# Patient Record
Sex: Female | Born: 1960 | Race: White | Hispanic: No | Marital: Married | Smoking: Former smoker
Health system: Southern US, Community
[De-identification: ages and names within clinical notes are randomized; demographics above are authoritative.]

## PROBLEM LIST (undated history)

## (undated) DIAGNOSIS — R112 Nausea with vomiting, unspecified: Secondary | ICD-10-CM

## (undated) DIAGNOSIS — Z9889 Other specified postprocedural states: Secondary | ICD-10-CM

## (undated) DIAGNOSIS — G4730 Sleep apnea, unspecified: Secondary | ICD-10-CM

## (undated) DIAGNOSIS — M5030 Other cervical disc degeneration, unspecified cervical region: Secondary | ICD-10-CM

## (undated) DIAGNOSIS — F419 Anxiety disorder, unspecified: Secondary | ICD-10-CM

## (undated) DIAGNOSIS — I10 Essential (primary) hypertension: Secondary | ICD-10-CM

## (undated) HISTORY — PX: BACK SURGERY: SHX140

## (undated) HISTORY — DX: Sleep apnea, unspecified: G47.30

## (undated) HISTORY — DX: Other specified postprocedural states: Z98.890

---

## 1998-04-12 ENCOUNTER — Ambulatory Visit: Admission: RE | Admit: 1998-04-12 | Discharge: 1998-04-12 | Payer: Self-pay | Admitting: Endocrinology

## 1998-10-28 ENCOUNTER — Other Ambulatory Visit: Admission: RE | Admit: 1998-10-28 | Discharge: 1998-10-28 | Payer: Self-pay | Admitting: Obstetrics & Gynecology

## 1999-01-02 ENCOUNTER — Encounter: Payer: Self-pay | Admitting: Obstetrics & Gynecology

## 1999-01-02 ENCOUNTER — Ambulatory Visit (HOSPITAL_COMMUNITY): Admission: RE | Admit: 1999-01-02 | Discharge: 1999-01-02 | Payer: Self-pay | Admitting: Obstetrics & Gynecology

## 1999-03-09 ENCOUNTER — Inpatient Hospital Stay (HOSPITAL_COMMUNITY): Admission: AD | Admit: 1999-03-09 | Discharge: 1999-03-09 | Payer: Self-pay | Admitting: Obstetrics and Gynecology

## 1999-03-11 ENCOUNTER — Inpatient Hospital Stay (HOSPITAL_COMMUNITY): Admission: AD | Admit: 1999-03-11 | Discharge: 1999-03-11 | Payer: Self-pay | Admitting: Obstetrics & Gynecology

## 1999-10-05 ENCOUNTER — Other Ambulatory Visit: Admission: RE | Admit: 1999-10-05 | Discharge: 1999-10-05 | Payer: Self-pay | Admitting: Obstetrics and Gynecology

## 2000-10-17 ENCOUNTER — Other Ambulatory Visit: Admission: RE | Admit: 2000-10-17 | Discharge: 2000-10-17 | Payer: Self-pay | Admitting: Obstetrics and Gynecology

## 2000-11-22 ENCOUNTER — Ambulatory Visit (HOSPITAL_COMMUNITY): Admission: RE | Admit: 2000-11-22 | Discharge: 2000-11-22 | Payer: Self-pay | Admitting: *Deleted

## 2000-11-22 ENCOUNTER — Encounter: Payer: Self-pay | Admitting: *Deleted

## 2000-12-29 ENCOUNTER — Encounter: Payer: Self-pay | Admitting: Obstetrics and Gynecology

## 2000-12-29 ENCOUNTER — Ambulatory Visit (HOSPITAL_COMMUNITY): Admission: RE | Admit: 2000-12-29 | Discharge: 2000-12-29 | Payer: Self-pay | Admitting: Obstetrics and Gynecology

## 2001-03-27 ENCOUNTER — Encounter (HOSPITAL_COMMUNITY): Admission: AD | Admit: 2001-03-27 | Discharge: 2001-04-14 | Payer: Self-pay | Admitting: *Deleted

## 2001-04-11 ENCOUNTER — Inpatient Hospital Stay (HOSPITAL_COMMUNITY): Admission: AD | Admit: 2001-04-11 | Discharge: 2001-04-17 | Payer: Self-pay | Admitting: Obstetrics and Gynecology

## 2001-04-11 ENCOUNTER — Encounter (INDEPENDENT_AMBULATORY_CARE_PROVIDER_SITE_OTHER): Payer: Self-pay

## 2001-04-12 ENCOUNTER — Encounter: Payer: Self-pay | Admitting: Obstetrics and Gynecology

## 2001-04-18 ENCOUNTER — Encounter: Admission: RE | Admit: 2001-04-18 | Discharge: 2001-04-19 | Payer: Self-pay | Admitting: Obstetrics and Gynecology

## 2001-10-19 ENCOUNTER — Other Ambulatory Visit: Admission: RE | Admit: 2001-10-19 | Discharge: 2001-10-19 | Payer: Self-pay | Admitting: Obstetrics and Gynecology

## 2002-10-30 ENCOUNTER — Other Ambulatory Visit: Admission: RE | Admit: 2002-10-30 | Discharge: 2002-10-30 | Payer: Self-pay | Admitting: Obstetrics and Gynecology

## 2004-03-31 ENCOUNTER — Ambulatory Visit (HOSPITAL_COMMUNITY): Admission: RE | Admit: 2004-03-31 | Discharge: 2004-03-31 | Payer: Self-pay | Admitting: Obstetrics and Gynecology

## 2004-11-09 ENCOUNTER — Other Ambulatory Visit: Admission: RE | Admit: 2004-11-09 | Discharge: 2004-11-09 | Payer: Self-pay | Admitting: Obstetrics and Gynecology

## 2005-10-27 ENCOUNTER — Ambulatory Visit (HOSPITAL_COMMUNITY): Admission: RE | Admit: 2005-10-27 | Discharge: 2005-10-27 | Payer: Self-pay | Admitting: Obstetrics and Gynecology

## 2006-11-11 ENCOUNTER — Ambulatory Visit (HOSPITAL_COMMUNITY): Admission: RE | Admit: 2006-11-11 | Discharge: 2006-11-11 | Payer: Self-pay | Admitting: Obstetrics and Gynecology

## 2007-11-28 ENCOUNTER — Ambulatory Visit (HOSPITAL_COMMUNITY): Admission: RE | Admit: 2007-11-28 | Discharge: 2007-11-28 | Payer: Self-pay | Admitting: Obstetrics and Gynecology

## 2010-09-01 ENCOUNTER — Ambulatory Visit: Payer: Self-pay | Admitting: Family Medicine

## 2010-09-01 DIAGNOSIS — E785 Hyperlipidemia, unspecified: Secondary | ICD-10-CM | POA: Insufficient documentation

## 2010-09-01 DIAGNOSIS — I10 Essential (primary) hypertension: Secondary | ICD-10-CM

## 2010-10-07 ENCOUNTER — Telehealth: Payer: Self-pay | Admitting: Family Medicine

## 2010-10-22 NOTE — Progress Notes (Signed)
----   Converted from flag ---- ---- 10/05/2010 9:08 AM, Nani Gasser MD wrote: Just went over her old records. Is she still on CPAP for sleep apnea? Still setting of 7? ------------------------------  10/07/10 10:08 acm called and left message for pt to call me back and let me knowPhone Note Call from Patient   Caller: Patient Summary of Call: pt called back and states she is still using CPAP machine with a setting of 7 Initial call taken by: Avon Gully CMA, Duncan Dull),  October 09, 2010 10:49 AM       Allergies: No Known Drug Allergies

## 2010-10-22 NOTE — Assessment & Plan Note (Signed)
Summary: NOV: URI   Vital Signs:  Patient profile:   50 year old female Height:      61 inches Weight:      183 pounds Pulse rate:   80 / minute BP sitting:   128 / 75  (right arm) Cuff size:   regular  Vitals Entered By: Avon Gully CMA, Duncan Dull) (September 01, 2010 3:01 PM) CC: NP-persistant cough that keeps her awake at night   CC:  NP-persistant cough that keeps her awake at night.  History of Present Illness: NP-persistant cough that keeps her awake at night. Started about 2 weeks ago. Thought initially was having bronchitis. Having alot of drainage but no nasal congestion. Occ will feel SOB with cough.  No fever.  No ear pain, Mild ST from the cough.  Some pressure around the left ear.  No sinus pressure or pain.  + sick contacts.  Taking delsym and cough drops.    Habits & Providers  Alcohol-Tobacco-Diet     Alcohol drinks/day: <1     Tobacco Status: quit     Year Quit: 1998  Exercise-Depression-Behavior     Does Patient Exercise: no     STD Risk: never     Drug Use: no     Seat Belt Use: always  Current Medications (verified): 1)  Lovaza 1 Gm Caps (Omega-3-Acid Ethyl Esters) .... Take One Tablet By Mouth Twice Daily 2)  Trilipix 135 Mg Cpdr (Choline Fenofibrate) .... Take One By Mouth Once Daily 3)  Diovan Hct 320-25 Mg Tabs (Valsartan-Hydrochlorothiazide) .... Take One By Mouth Once Daily 4)  Multivitamins  Caps (Multiple Vitamin) .... Take One Tablet By Mouth Once Daily 5)  Bupropion Hcl 300 Mg Xr24h-Tab (Bupropion Hcl) .... Take One Tablet By Mouth Once Daily 6)  Effexor Xr 37.5 Mg Xr24h-Cap (Venlafaxine Hcl) .... Take One By Mouth Once Daily  Allergies (verified): No Known Drug Allergies  Comments:  Nurse/Medical Assistant: The patient's medications and allergies were reviewed with the patient and were updated in the Medication and Allergy Lists. Avon Gully CMA, Duncan Dull) (September 01, 2010 3:06 PM)  Past History:  Past Surgical History: c/sec  2002  Family History: Mother with hi chol Father with hi cho, suicide MGM with DM Brother with high cho  Social History: Veterinary surgeon for [employer].  College degree. Married to [employer] with 1 child Former Smoker Alcohol use-yes Drug use-no Regular exercise-no 4-5 caffeinated drinks per day. Smoking Status:  quit Does Patient Exercise:  no STD Risk:  never Drug Use:  no Seat Belt Use:  always  Review of Systems       No fever/sweats/weakness, unexplained weight loss/gain.  No vison changes.  No difficulty hearing/ringing in ears, hay fever/allergies.  No chest pain/discomfort, palpitations.  No Br lump/nipple discharge.  + cough/wheeze.  No blood in BM, nausea/vomiting/diarrhea.  No nighttime urination, leaking urine, unusual vaginal bleeding, discharge (penis or vagina).  No muscle/joint pain. No rash, change in mole.  No HA, memory loss.  No anxiety, sleep d/o, depression.  No easy bruising/bleeding, unexplained lump   Physical Exam  General:  Well-developed,well-nourished,in no acute distress; alert,appropriate and cooperative throughout examination Head:  Normocephalic and atraumatic without obvious abnormalities. No apparent alopecia or balding. Eyes:  No corneal or conjunctival inflammation noted. EOMI. Perrla.  Ears:  External ear exam shows no significant lesions or deformities.  Otoscopic examination reveals clear canals, tympanic membranes are intact bilaterally without bulging, retraction, inflammation or discharge. Hearing is grossly normal bilaterally.  Nose:  External nasal examination shows no deformity or inflammation. Nasal mucosa are pink and moist without lesions or exudates. Mouth:  Oral mucosa and oropharynx without lesions or exudates.   Neck:  No deformities, masses, or tenderness noted. Lungs:  Normal respiratory effort, chest expands symmetrically. Lungs are clear to auscultation, no crackles or wheezes. Heart:  Normal rate and regular rhythm.  S1 and S2 normal without gallop, murmur, click, rub or other extra sounds. Skin:  no rashes.   Cervical Nodes:  No lymphadenopathy noted Psych:  Cognition and judgment appear intact. Alert and cooperative with normal attention span and concentration. No apparent delusions, illusions, hallucinations   Impression & Recommendations:  Problem # 1:  URI (ICD-465.9) I really do think this is viral. Treat nightime cough. Gave reassurance. Call if not better in one weekk, or new signs, or worsen.  Her updated medication list for this problem includes:    Hydrocodone-homatropine 5-1.5 Mg/51ml Syrp (Hydrocodone-homatropine) .Marland KitchenMarland KitchenMarland KitchenMarland Kitchen 5ml by mouth at bedtime as needed cough  Complete Medication List: 1)  Lovaza 1 Gm Caps (Omega-3-acid ethyl esters) .... Take one tablet by mouth twice daily 2)  Trilipix 135 Mg Cpdr (Choline fenofibrate) .... Take one by mouth once daily 3)  Diovan Hct 320-25 Mg Tabs (Valsartan-hydrochlorothiazide) .... Take one by mouth once daily 4)  Multivitamins Caps (Multiple vitamin) .... Take one tablet by mouth once daily 5)  Bupropion Hcl 300 Mg Xr24h-tab (Bupropion hcl) .... Take one tablet by mouth once daily 6)  Effexor Xr 37.5 Mg Xr24h-cap (Venlafaxine hcl) .... Take one by mouth once daily 7)  Hydrocodone-homatropine 5-1.5 Mg/68ml Syrp (Hydrocodone-homatropine) .... 5ml by mouth at bedtime as needed cough  Other Orders: T-Comprehensive Metabolic Panel [phone]) T-Lipid Profile ([id number]) T-TSH [phone])  Patient Instructions: 1)  Follow up in january for routine follow up on medications.  Prescriptions: HYDROCODONE-HOMATROPINE 5-1.5 MG/5ML SYRP (HYDROCODONE-HOMATROPINE) 5ml by mouth at bedtime as needed cough  #157ml x 0   Entered and Authorized by:   Nani Gasser MD   Signed by:   Nani Gasser MD on 09/01/2010   Method used:   Printed then faxed to ...       [facility]* (retail)       [address] [place], [place]  [address]        Ph: [phone]       Fax: [phone]   RxID:   [id number]    Orders Added: 1)  T-Comprehensive Metabolic Panel [[id number]] 2)  T-Lipid Profile [[id number]] 3)  T-TSH [[id number]] 4)  Est. Patient Level III [18841] 5)  New Patient Level III [66063]  Appended Document: NOV: URI    Past History:  Past Medical History: Sleep Apnea CPAP 7 cm water

## 2010-10-22 NOTE — Letter (Signed)
Summary: Records Dated 01-27-05 thru 10-13-09/[facility]  Records Dated 01-27-05 thru 10-13-09/[facility]   Imported By: Lanelle Bal 10/15/2010 10:06:17  _____________________________________________________________________  External Attachment:    Type:   Image     Comment:   External Document

## 2010-11-02 ENCOUNTER — Telehealth: Payer: Self-pay | Admitting: Family Medicine

## 2010-11-05 ENCOUNTER — Encounter: Payer: Self-pay | Admitting: Family Medicine

## 2010-11-05 LAB — CONVERTED CEMR LAB
ALT: 25 units/L (ref 0–35)
AST: 21 units/L (ref 0–37)
Albumin: 4.6 g/dL (ref 3.5–5.2)
Calcium: 9.4 mg/dL (ref 8.4–10.5)
Chloride: 100 meq/L (ref 96–112)
LDL Cholesterol: 105 mg/dL — ABNORMAL HIGH (ref 0–99)
Potassium: 4 meq/L (ref 3.5–5.3)
Sodium: 138 meq/L (ref 135–145)
TSH: 3.851 microintl units/mL (ref 0.350–4.500)
Total Protein: 6.9 g/dL (ref 6.0–8.3)

## 2010-11-11 NOTE — Progress Notes (Signed)
  Phone Note Refill Request Message from:  Fax from Pharmacy on November 02, 2010 9:30 AM  Refills Requested: Medication #1:  BUPROPION HCL 300 MG XR24H-TAB take one tablet by mouth once daily Initial call taken by: Avon Gully CMA, Duncan Dull),  November 02, 2010 9:30 AM    Prescriptions: BUPROPION HCL 300 MG XR24H-TAB (BUPROPION HCL) take one tablet by mouth once daily  #90 x 1   Entered by:   Avon Gully CMA, (AAMA)   Authorized by:   Nani Gasser MD   Signed by:   Avon Gully CMA, (AAMA) on 11/02/2010   Method used:   Printed then faxed to ...       [facility]* (mail-order)       [address] [place], [place]  [address]       Ph: [phone]       Fax: [phone]   RxID:   [id number]

## 2010-11-16 ENCOUNTER — Ambulatory Visit (INDEPENDENT_AMBULATORY_CARE_PROVIDER_SITE_OTHER): Payer: BC Managed Care – PPO | Admitting: Family Medicine

## 2010-11-16 ENCOUNTER — Encounter: Payer: Self-pay | Admitting: Family Medicine

## 2010-11-16 DIAGNOSIS — F39 Unspecified mood [affective] disorder: Secondary | ICD-10-CM

## 2010-11-16 DIAGNOSIS — E785 Hyperlipidemia, unspecified: Secondary | ICD-10-CM

## 2010-11-16 DIAGNOSIS — E1165 Type 2 diabetes mellitus with hyperglycemia: Secondary | ICD-10-CM

## 2010-11-16 DIAGNOSIS — E1159 Type 2 diabetes mellitus with other circulatory complications: Secondary | ICD-10-CM | POA: Insufficient documentation

## 2010-11-16 DIAGNOSIS — E119 Type 2 diabetes mellitus without complications: Secondary | ICD-10-CM | POA: Insufficient documentation

## 2010-11-16 DIAGNOSIS — R7309 Other abnormal glucose: Secondary | ICD-10-CM

## 2010-11-24 ENCOUNTER — Encounter: Payer: Self-pay | Admitting: *Deleted

## 2010-11-26 NOTE — Assessment & Plan Note (Signed)
Summary: fu bloodwork   Vital Signs:  Patient profile:   50 year old female Height:      61 inches Weight:      187 pounds Pulse rate:   94 / minute BP sitting:   116 / 68  (right arm) Cuff size:   regular  Vitals Entered By: Avon Gully CMA, Duncan Dull) (November 16, 2010 8:35 AM) CC: f/u cholesterol, Hypertension Management   CC:  f/u cholesterol and Hypertension Management.  History of Present Illness: Here to discuss labs results. Also wanting to only be on one mood med for her irritabilityh and menopausal sxs.    Hypertension History:      She denies headache, chest pain, palpitations, dyspnea with exertion, orthopnea, PND, peripheral edema, visual symptoms, neurologic problems, syncope, and side effects from treatment.  She notes no problems with any antihypertensive medication side effects.        Positive major cardiovascular risk factors include hyperlipidemia and hypertension.  Negative major cardiovascular risk factors include female age less than 22 years old and non-tobacco-user status.     Current Medications (verified): 1)  Lovaza 1 Gm Caps (Omega-3-Acid Ethyl Esters) .... Take One Tablet By Mouth Twice Daily 2)  Trilipix 135 Mg Cpdr (Choline Fenofibrate) .... Take One By Mouth Once Daily 3)  Diovan Hct 320-25 Mg Tabs (Valsartan-Hydrochlorothiazide) .... Take One By Mouth Once Daily 4)  Multivitamins  Caps (Multiple Vitamin) .... Take One Tablet By Mouth Once Daily 5)  Bupropion Hcl 300 Mg Xr24h-Tab (Bupropion Hcl) .... Take One Tablet By Mouth Once Daily 6)  Effexor Xr 37.5 Mg Xr24h-Cap (Venlafaxine Hcl) .... Take One By Mouth Once Daily 7)  Hydrocodone-Homatropine 5-1.5 Mg/32ml Syrp (Hydrocodone-Homatropine) .... 5ml By Mouth At Bedtime As Needed Cough  Allergies (verified): No Known Drug Allergies  Comments:  Nurse/Medical Assistant: The patient's medications and allergies were reviewed with the patient and were updated in the Medication and Allergy  Lists. Avon Gully CMA, Duncan Dull) (November 16, 2010 8:35 AM)  Physical Exam  General:  Well-developed,well-nourished,in no acute distress; alert,appropriate and cooperative throughout examination Lungs:  Normal respiratory effort, chest expands symmetrically. Lungs are clear to auscultation, no crackles or wheezes. Heart:  Normal rate and regular rhythm. S1 and S2 normal without gallop, murmur, click, rub or other extra sounds. Pulses:  RAdial 2+    Impression & Recommendations:  Problem # 1:  HYPERLIPIDEMIA (ICD-272.4) Encorage regular exercise to improer her LDL and imcrease her HDL.   Her updated medication list for this problem includes:    Lovaza 1 Gm Caps (Omega-3-acid ethyl esters) .Marland Kitchen... Take one tablet by mouth twice daily    Trilipix 135 Mg Cpdr (Choline fenofibrate) .Marland Kitchen... 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  F/U and adjust effexor as needed. She is mainly taking this for mood and menopausal sxs.    Problem # 3:  OTHER ABNORMAL GLUCOSE (ICD-790.29) Dsicussed dec sweets and carb intake. Recheck in 6 weeks with fasting glucose.   Complete Medication List: 1)  Lovaza 1 Gm Caps (Omega-3-acid ethyl esters) .... Take one tablet by mouth twice daily 2)  Trilipix 135 Mg Cpdr (Choline fenofibrate) .... Take one by mouth once daily 3)  Diovan Hct 320-25 Mg Tabs (Valsartan-hydrochlorothiazide) .... Take one by mouth once daily 4)  Multivitamins Caps (Multiple vitamin) .... Take one tablet by mouth once daily 5)  Bupropion Hcl 150 Mg Xr12h-tab (Bupropion hcl) .... Take 1  tablet by mouth once a day 6)  Effexor Xr 37.5 Mg Xr24h-cap (Venlafaxine hcl) .... Take one by mouth once daily  Hypertension Assessment/Plan:      The patient's hypertensive risk group is category B: At least one risk factor (excluding diabetes) with no target organ damage.  Her calculated 10 year risk of coronary heart disease is 3 %.   Today's blood pressure is 116/68.  Her blood pressure goal is < 140/90.  Patient Instructions: 1)  Decrease to 150mg  Buproprion for one month then take every other day for 2 weeksa nd then stop.  2)  Please schedule a follow-up appointment in 4-6 weeks for mood and fingerstick blood sugar (make sure fasting).   3)  Work on getting regular exercise and decreasing your carbs.  Prescriptions: BUPROPION HCL 150 MG XR12H-TAB (BUPROPION HCL) Take 1 tablet by mouth once a day  #30 x 0   Entered and Authorized by:   Nani Gasser MD   Signed by:   Nani Gasser MD on 11/16/2010   Method used:   Electronically to        [facility]* (retail)       [address] [place], [place]  [address]       Ph: [phone]       Fax: [phone]   RxID:   [id number]    Orders Added: 1)  Est. Patient Level III [84132]

## 2010-12-11 ENCOUNTER — Telehealth: Payer: Self-pay | Admitting: *Deleted

## 2010-12-11 NOTE — Telephone Encounter (Signed)
Pt states she has questions about coming off of antidepressant. Please call and discuss w/ Pt.

## 2010-12-14 ENCOUNTER — Other Ambulatory Visit: Payer: Self-pay | Admitting: Family Medicine

## 2010-12-16 NOTE — Telephone Encounter (Signed)
Called and left message on pt vm that per last note pt was to f/u in 4-6 weeks so advised to schedule appt to discuss concerns with Dr.  About mood

## 2010-12-20 ENCOUNTER — Encounter: Payer: Self-pay | Admitting: Family Medicine

## 2010-12-21 ENCOUNTER — Ambulatory Visit (INDEPENDENT_AMBULATORY_CARE_PROVIDER_SITE_OTHER): Payer: BC Managed Care – PPO | Admitting: Family Medicine

## 2010-12-21 ENCOUNTER — Encounter: Payer: Self-pay | Admitting: Family Medicine

## 2010-12-21 VITALS — BP 107/73 | HR 84 | Ht 61.0 in | Wt 188.0 lb

## 2010-12-21 DIAGNOSIS — R7309 Other abnormal glucose: Secondary | ICD-10-CM

## 2010-12-21 DIAGNOSIS — F39 Unspecified mood [affective] disorder: Secondary | ICD-10-CM

## 2010-12-21 DIAGNOSIS — I10 Essential (primary) hypertension: Secondary | ICD-10-CM

## 2010-12-21 LAB — POCT CBG (FASTING - GLUCOSE)-MANUAL ENTRY: Glucose Fasting, POC: 104 mg/dL

## 2010-12-21 MED ORDER — VALSARTAN-HYDROCHLOROTHIAZIDE 160-25 MG PO TABS: 1.0000 | ORAL_TABLET | Freq: Every day | ORAL | Status: DC

## 2010-12-21 NOTE — Assessment & Plan Note (Addendum)
Doing well. BP is actually a little low and you feel you are having sxs so will dec her diovan dose. Recheck in 1 month to make sure not running too high.

## 2010-12-21 NOTE — Assessment & Plan Note (Signed)
Stay on wellbutrin 150 until see ob about the Efffexor  At her f/u visit with OB.  After 2 weeks on the increase dose of effexor then can start to wean the welbutrin.

## 2010-12-21 NOTE — Progress Notes (Signed)
  Subjective:    Patient ID: Loretta Bullock, female    DOB: [birth year], 50 y.o.   MRN: [id number]  Hypertension This is a chronic problem. The problem has been gradually improving since onset. The problem is controlled. Associated symptoms include malaise/fatigue. Pertinent negatives include no chest pain, palpitations, peripheral edema or shortness of breath. (Dizziness. ) There are no associated agents to hypertension. Past treatments include diuretics and angiotensin blockers.   Likes the fluid pill part to control her ankle edema.   Tried weaning off the wellbutrin. Has been more irritable on the lower dose.  She is also on effexor and sees her GYN later in April.   Review of Systems  Constitutional: Positive for malaise/fatigue.  Respiratory: Negative for shortness of breath.   Cardiovascular: Negative for chest pain and palpitations.       Objective:   Physical Exam  Constitutional: She appears well-developed and well-nourished.  Neck: Normal range of motion. Normal carotid pulses present.  Cardiovascular: Normal rate, regular rhythm and normal heart sounds.   Pulmonary/Chest: Effort normal and breath sounds normal.          Assessment & Plan:

## 2010-12-21 NOTE — Assessment & Plan Note (Addendum)
Glucose was 104 today. Will change dx to insulin resistance.  F/u in 6 months. Continue to work on low carb and sugar intake.  Regular exercise.

## 2010-12-22 ENCOUNTER — Other Ambulatory Visit: Payer: Self-pay | Admitting: *Deleted

## 2010-12-22 MED ORDER — BUPROPION HCL ER (SR) 150 MG PO TB12: 150.0000 mg | ORAL_TABLET | ORAL | Status: DC

## 2010-12-22 NOTE — Telephone Encounter (Signed)
Pt called and states she received 15 pills for wellbutrin and was seen yesterday and needs the other 15 pills sent to [facility]

## 2010-12-28 ENCOUNTER — Ambulatory Visit: Payer: BC Managed Care – PPO | Admitting: Family Medicine

## 2011-01-20 ENCOUNTER — Encounter: Payer: Self-pay | Admitting: Family Medicine

## 2011-01-20 ENCOUNTER — Ambulatory Visit: Payer: BC Managed Care – PPO | Admitting: Family Medicine

## 2011-01-20 ENCOUNTER — Ambulatory Visit (INDEPENDENT_AMBULATORY_CARE_PROVIDER_SITE_OTHER): Payer: BC Managed Care – PPO | Admitting: Family Medicine

## 2011-01-20 DIAGNOSIS — F39 Unspecified mood [affective] disorder: Secondary | ICD-10-CM

## 2011-01-20 DIAGNOSIS — G4730 Sleep apnea, unspecified: Secondary | ICD-10-CM

## 2011-01-20 DIAGNOSIS — I10 Essential (primary) hypertension: Secondary | ICD-10-CM

## 2011-01-20 NOTE — Assessment & Plan Note (Signed)
Overall she's doing well but has noticed a little increase in irritability. We may need to adjust her Effexor but we will continue the current dose for now and followup in July. she can certainly call in the interim if she feels that her dose needs to be adjusted.

## 2011-01-20 NOTE — Progress Notes (Signed)
  Subjective:    Patient ID: Loretta Bullock, female    DOB: 1961-08-29, 50 y.o.   MRN: [id number]  HPI BP has been normal at home. Feeling a little better on the lower dose of Diovan. She has been trying to work on her diet and weight loss.  Doing well on the effexor 75. We increased her dose at the visit.  Almost off the wellbutrin. She has one tab left she has noticed a little bit of increased irritability she is also going through some stressful things at home right now.  She is trying to get just on one med off her list.     Review of Systems     Objective:   Physical Exam  Constitutional: She appears well-developed and well-nourished.  Cardiovascular: Normal rate, regular rhythm and normal heart sounds.   Pulmonary/Chest: Effort normal and breath sounds normal.          Assessment & Plan:

## 2011-01-20 NOTE — Assessment & Plan Note (Signed)
Her blood pressure looks fantastic today. Home blood pressures have been well controlled 2. That's recheck in about 2-3 months and hopefully we can even more reduce her medication.

## 2011-02-05 NOTE — Op Note (Signed)
[facility]  Patient:    Loretta Bullock, Loretta Bullock                       MRN: [id number] Proc. Date: 04/13/01 Adm. Date:  [id number] Attending:  Malon Kindle                           Operative Report  PREOPERATIVE DIAGNOSES:       1. Intrauterine pregnancy at 36 weeks.                               2. Advanced maternal age.                               3. Gestational hypertension.                               4. Arrest of descent.  POSTOPERATIVE DIAGNOSES:      1. Intrauterine pregnancy at 36 weeks.                               2. Advanced maternal age.                               3. Gestational hypertension.                               4. Arrest of descent.  PROCEDURE:                    Primary low transverse cesarean section without extension.  SURGEON:                      Zenaida Niece, M.D.  ANESTHESIA:                   Epidural.  ESTIMATED BLOOD LOSS:         800 cc.  FINDINGS:                     Normal anatomy.  Delivery of a viable female infant with Apgars of 9 and 9 and weight 5 lb 11 oz.  PROCEDURE IN DETAIL:          The patient was taken to the operating room and placed in the dorsal supine position with a left lateral tilt.  Her previously-placed epidural was dosed appropriately.  Her abdomen was prepped and draped in the usual sterile fashion.  The level of her anesthesia was found to be adequate.  Her abdomen was entered via a standard Pfannenstiel incision.  The vesicouterine peritoneum was incised and a bladder flap created digitally.  A 4 cm transverse incision was made in the lower uterine segment and clear amniotic fluid noted.  The uterine incision was extended bilaterally digitally.  The fetal vertex was grasped and moved out of the pelvis and was found to be in an OP position.  It was rotated and delivered through the incision atraumatically.  The mouth and nares were suctioned and the remainder of the  infant delivered atraumatically.  The cord  was doubly clamped and cut and the infant handed to the awaiting pediatric team.  Cord blood was obtained and the placenta was manually delivered.  The uterus was wiped dry with a clean lap pad.  The uterine incision was inspected and found to be free of extensions.  The uterine incision was closed in one layer, being a running locking layer with #1 chromic.  An additional imbricating suture was used on the right angle to achieve adequate hemostasis.  Bleeding from the serosal edges was controlled with electrocautery.  The tubes and ovaries were inspected and found to be normal.  The subfascial space was then irrigated and made hemostatic with electrocautery.  The fascia was closed in a running fashion starting at both ends and meeting in the middle with 0 Vicryl.  The subcutaneous tissue was irrigated and made hemostatic with electrocautery. The skin was then closed with staples and a sterile dressing.  The patient tolerated the procedure well and was taken to the recovery room in stable condition.  She received Ancef 1 g after cord clamp.  All counts were correct. D:  04/13/01 TD:  04/14/01 Job: [id number]

## 2011-02-05 NOTE — Discharge Summary (Signed)
[facility]  Patient:    Loretta Bullock, Loretta Bullock                       MRN: [id number] Adm. Date:  [id number] Disc. Date: [id number] Attending:  Malon Kindle                           Discharge Summary  ADMISSION DIAGNOSES:          Intrauterine pregnancy at 36 weeks, gestational hypertension.  DISCHARGE DIAGNOSES:          Intrauterine pregnancy at 36 weeks, gestational hypertension, arrest of descent.  PROCEDURE:                    Induction of labor, primary low transverse cesarean section.  COMPLICATIONS:                None.  CONSULTATIONS:                None.  HISTORY AND PHYSICAL:         This is a 50 year old white female gravida 2, para 0-0-1-0 with an EGA of [redacted] weeks by an LMP consistent with a 9 week ultrasound who presents for induction due to significant gestational hypertension.  She has had no symptoms of preeclampsia and has had normal laboratories but blood pressures have been up in the office today to 170s to 1-teens.  She had occasional contractions.  No vaginal bleeding or ruptured membranes.  Good fetal activity.  Prenatal care has been complicated by advanced maternal age for which she declined amniocentesis and had a normal ultrasound.  She had an upper respiratory infection at 19 weeks treated with Zithromax.  She has had symptoms of carpal tunnel syndrome.  PRENATAL LABORATORIES:        Blood type A+ with a negative antibody screen. Hemoglobin 12.2, platelets 321,000.  Rubella immune.  Hepatitis B surface antigen negative.  HIV negative.  RPR nonreactive.  Gonorrhea and chlamydia negative.  One hour glucola 99.  Group B strep negative.  PAST OBSTETRICAL HISTORY:     Significant for an ectopic pregnancy in May 2000 treated with methotrexate.  She has also had cryotherapy in the 80s with normal followup and no other medical problems.  PHYSICAL EXAMINATION  VITAL SIGNS:                  Stable except for blood  pressures 150s-170s/90s-100.  ABDOMEN:                      Gravid, nontender with a fundal height of 36 cm and an estimated fetal weight of 6.5 pounds.  EXTREMITIES:                  Edema 3+.  DTRs 2+/4.  No clonus.  PELVIC:                       Vaginal examination was 1, 50, -3 with a vertex presentation and fetal heart rate was in the 130s-140s and was reactive with irregular contractions.  HOSPITAL COURSE:              The patient was initially evaluated in triage and her blood pressures remained elevated.  PIH laboratories were normal.  She was admitted for induction due to blood pressures which may require treatment. She was started on high dose Pitocin  but due to the high station of the baby I was not able to get the baby down low enough to rupture membranes.  On July 24 Dr. Ambrose Mantle managed the patient and she received three doses of prostaglandin gel to ripen her cervix.  On the morning of July 25 she had changed to 2, 80, and -2 and at that time I was able to perform artificial rupture of membranes which revealed clear fluid and she was started on Pitocin.  Blood pressures remained elevated, but stable and to this point did not require treatment. She entered active labor with the aid of Pitocin and received an epidural. She did receive one dose of 10 mg of labetalol after she received her epidural to help maintain her blood pressure below 180/110.  She reached complete and pushed for three hours and did not descend past a +1/+2 station.  At this point we agreed to proceed with a cesarean section for arrest of descent.  She had this performed on the evening of July 25 under epidural anesthesia. Estimated blood loss was 800 cc.  Patient had normal anatomy and delivered a viable female infant with Apgars of 9/9 that weighed 5 pounds 11 ounces.  She did require one more dose of labetalol once she got out to the postpartum floor to control her blood pressure.  Blood pressure then  remained slightly labile throughout the remainder of her course but she did not require any further medication.  Pre delivery hemoglobin 12.6, post delivery 10.7.  She was able to rapidly ambulate and tolerate a regular diet.  On the morning of postoperative day #4 she was felt to be stable enough for discharge home.  Her staples had been removed and Steri-Strips applied on postoperative day #3 and her incision appeared to be healing well.  CONDITION ON DISCHARGE:       Stable.  DISPOSITION:                  Discharged to home.  DIET:                         Regular.  ACTIVITY:                     Pelvic rest.  No strenuous activity and no driving.  FOLLOW-UP:                    Ten days for an incision check.  MEDICATIONS:                  Percocet p.r.n. pain.  DISCHARGE INSTRUCTIONS:       She is given our discharge pamphlet. DD:  04/17/01 TD:  04/17/01 Job: [id number]

## 2011-02-12 ENCOUNTER — Other Ambulatory Visit: Payer: Self-pay | Admitting: *Deleted

## 2011-02-12 MED ORDER — VALSARTAN-HYDROCHLOROTHIAZIDE 160-25 MG PO TABS: 1.0000 | ORAL_TABLET | Freq: Every day | ORAL | Status: DC

## 2011-02-17 ENCOUNTER — Telehealth: Payer: Self-pay | Admitting: Family Medicine

## 2011-02-17 MED ORDER — VALSARTAN-HYDROCHLOROTHIAZIDE 160-25 MG PO TABS: 1.0000 | ORAL_TABLET | Freq: Every day | ORAL | Status: DC

## 2011-02-17 NOTE — Telephone Encounter (Signed)
Pt called and said [facility] had cancelled her prescription for Diovan-HCT 160-25mg  and she does not know why.  Wants to stop dealing with them and wants [facility].  I was going to send but there is apparently a discrepancy regarding the dosing regimen.  Pt says she is suppose to be on 160/25 and she is not having any problems with her BP, but notes state decreased down to 160-12.5mg .  Can you please clarify and I'll send script to the pharm for the patient.  Thanks, Plan:  Routed to Dr. Marlyne Beards, LPN [facility]

## 2011-02-17 NOTE — Telephone Encounter (Signed)
[facility]

## 2011-02-18 NOTE — Telephone Encounter (Signed)
Pt notified that her script for Diovan HCT sent to her pharmacy. Jarvis Newcomer, LPN [facility]

## 2011-03-12 ENCOUNTER — Other Ambulatory Visit: Payer: Self-pay | Admitting: Family Medicine

## 2011-03-12 DIAGNOSIS — E785 Hyperlipidemia, unspecified: Secondary | ICD-10-CM

## 2011-03-12 MED ORDER — OMEGA-3-ACID ETHYL ESTERS 1 G PO CAPS: 1.0000 g | ORAL_CAPSULE | Freq: Two times a day (BID) | ORAL | Status: DC

## 2011-03-12 NOTE — Telephone Encounter (Signed)
Pt called and requested refill for 90 day supply of lovaza 1gm each (takes 2 PO daily) #180/0 refills sent to the [facility]. Jarvis Newcomer, LPN [facility] Dimes

## 2011-04-05 ENCOUNTER — Ambulatory Visit (INDEPENDENT_AMBULATORY_CARE_PROVIDER_SITE_OTHER): Payer: BC Managed Care – PPO | Admitting: Family Medicine

## 2011-04-05 DIAGNOSIS — F39 Unspecified mood [affective] disorder: Secondary | ICD-10-CM

## 2011-04-05 DIAGNOSIS — I10 Essential (primary) hypertension: Secondary | ICD-10-CM

## 2011-04-05 MED ORDER — VALSARTAN-HYDROCHLOROTHIAZIDE 160-12.5 MG PO TABS: 1.0000 | ORAL_TABLET | Freq: Every day | ORAL | Status: DC

## 2011-04-05 NOTE — Assessment & Plan Note (Signed)
Well controlled. Will actually dec the diuretic component of her BP pill. F/u for recheck in 1 months. If not at goal then will go back up on med. Will likely need a 90 day rx in 1 mo. She plans to sched a physical in the fall.

## 2011-04-05 NOTE — Progress Notes (Signed)
  Subjective:    Patient ID: Loretta Bullock, female    DOB: Sep 12, 1961, 50 y.o.   MRN: [id number]  Hypertension This is a chronic problem. The current episode started more than 1 year ago. The problem is controlled. Pertinent negatives include no chest pain or shortness of breath. There are no associated agents to hypertension. Risk factors for coronary artery disease include no known risk factors. Past treatments include angiotensin blockers and diuretics. There are no compliance problems.    Couple of lightheaded spells.   Mood - Feels her mood is well controlled. She is still occ anxious but doing well.  Taking her effexor regularly. Sleeping well.   Review of Systems  Respiratory: Negative for shortness of breath.   Cardiovascular: Negative for chest pain.       Objective:   Physical Exam  Constitutional: She is oriented to person, place, and time. She appears well-developed and well-nourished.  HENT:  Head: Normocephalic and atraumatic.  Cardiovascular: Normal rate, regular rhythm and normal heart sounds.   Pulmonary/Chest: Effort normal and breath sounds normal.  Musculoskeletal: She exhibits no edema.  Neurological: She is alert and oriented to person, place, and time.  Skin: Skin is warm and dry.  Psychiatric: She has a normal mood and affect.          Assessment & Plan:

## 2011-04-05 NOTE — Assessment & Plan Note (Signed)
GAD-7 score of 3.  At goal. She is happy with her regimen.  Will continue. F.U in 6 months.

## 2011-04-05 NOTE — Patient Instructions (Signed)
Remember to get your mammogram downstairs Please call to schedule your physical.

## 2011-05-05 ENCOUNTER — Ambulatory Visit (INDEPENDENT_AMBULATORY_CARE_PROVIDER_SITE_OTHER): Payer: BC Managed Care – PPO | Admitting: Family Medicine

## 2011-05-05 ENCOUNTER — Encounter: Payer: Self-pay | Admitting: Family Medicine

## 2011-05-05 VITALS — BP 105/61 | HR 96 | Ht 61.0 in | Wt 190.0 lb

## 2011-05-05 DIAGNOSIS — I10 Essential (primary) hypertension: Secondary | ICD-10-CM

## 2011-05-05 DIAGNOSIS — C189 Malignant neoplasm of colon, unspecified: Secondary | ICD-10-CM

## 2011-05-05 MED ORDER — VALSARTAN-HYDROCHLOROTHIAZIDE 80-12.5 MG PO TABS: 1.0000 | ORAL_TABLET | Freq: Every day | ORAL | Status: DC

## 2011-05-05 NOTE — Assessment & Plan Note (Signed)
She is veyr well controlled, Will dec the diovan dose. She would like to keep the low dose diuretic part. F/U in 6 mo.  Due for labs.

## 2011-05-05 NOTE — Progress Notes (Signed)
  Subjective:    Patient ID: Loretta Bullock, female    DOB: [birth year], 50 y.o.   MRN: [id number]  Hypertension This is a chronic problem. The current episode started more than 1 year ago. The problem is controlled. Pertinent negatives include no blurred vision, chest pain or shortness of breath. There are no associated agents to hypertension. Risk factors for coronary artery disease include no known risk factors. Past treatments include ACE inhibitors and diuretics. The current treatment provides moderate improvement. There are no compliance problems.       Review of Systems  Eyes: Negative for blurred vision.  Respiratory: Negative for shortness of breath.   Cardiovascular: Negative for chest pain.       Objective:   Physical Exam  Constitutional: She is oriented to person, place, and time. She appears well-developed and well-nourished.  Cardiovascular: Normal rate, regular rhythm and normal heart sounds.   Pulmonary/Chest: Effort normal.  Musculoskeletal: She exhibits no edema.  Neurological: She is alert and oriented to person, place, and time.  Skin: Skin is warm and dry.  Psychiatric: She has a normal mood and affect. Her behavior is normal.          Assessment & Plan:  Discussed need for colon cancer screenig.

## 2011-05-28 ENCOUNTER — Ambulatory Visit (INDEPENDENT_AMBULATORY_CARE_PROVIDER_SITE_OTHER): Payer: BC Managed Care – PPO | Admitting: Family Medicine

## 2011-05-28 VITALS — BP 116/73 | HR 89

## 2011-05-28 DIAGNOSIS — R7301 Impaired fasting glucose: Secondary | ICD-10-CM

## 2011-05-28 NOTE — Progress Notes (Signed)
She is here to have her glucose checked. It was slightly elevated 6 months ago. It is still elevated today but in the prediabetic range we'll continue to monitor this and recheck again in 6 months.

## 2011-06-04 ENCOUNTER — Other Ambulatory Visit: Payer: Self-pay | Admitting: Family Medicine

## 2011-06-04 MED ORDER — CHOLINE FENOFIBRATE 135 MG PO CPDR: 135.0000 mg | DELAYED_RELEASE_CAPSULE | Freq: Every day | ORAL | Status: DC

## 2011-06-04 NOTE — Telephone Encounter (Signed)
Pt called and wants to start using local [facility] instead of mail order pharm.  Needs Rf Trilipix 135 mg. Plan:  Reviewed the pt chart, and chol numbers looked good last time checked.  Pt said the provider said she could go a year before rechecking again.  Refilled for 90 day supplies to local pharm #90/1 refill. Jarvis Newcomer, LPN [facility] Dimes

## 2011-06-14 ENCOUNTER — Encounter: Payer: Self-pay | Admitting: Family Medicine

## 2011-06-14 ENCOUNTER — Ambulatory Visit (INDEPENDENT_AMBULATORY_CARE_PROVIDER_SITE_OTHER): Payer: BC Managed Care – PPO | Admitting: Family Medicine

## 2011-06-14 VITALS — BP 132/84 | HR 93 | Temp 98.7°F | Wt 191.0 lb

## 2011-06-14 DIAGNOSIS — J40 Bronchitis, not specified as acute or chronic: Secondary | ICD-10-CM

## 2011-06-14 MED ORDER — AZITHROMYCIN 250 MG PO TABS: ORAL_TABLET | ORAL | Status: AC

## 2011-06-14 MED ORDER — HYDROCODONE-HOMATROPINE 5-1.5 MG/5ML PO SYRP: 5.0000 mL | ORAL_SOLUTION | Freq: Every evening | ORAL | Status: AC | PRN

## 2011-06-14 NOTE — Patient Instructions (Signed)
Call if not better in 10 days.   

## 2011-06-14 NOTE — Progress Notes (Signed)
  Subjective:    Patient ID: Loretta Bullock, female    DOB: 07/29/1961, 50 y.o.   MRN: [id number]  HPI FEtl fatigued and run down last week. The started with cough 4 days ago.  No SOB. No fever. Mild ST.  Raspy voice.  Has thick sputum, dark in color.  No hx of asthma. Cough and driange kept her awake. Getting drainage in her chest with burning sensation.  No N/V/D.  Taking Dayquail today - helps some.   Review of Systems     Objective:   Physical Exam  Constitutional: She is oriented to person, place, and time. She appears well-developed and well-nourished.  HENT:  Head: Normocephalic and atraumatic.  Cardiovascular: Normal rate, regular rhythm and normal heart sounds.   Pulmonary/Chest: Effort normal and breath sounds normal.  Neurological: She is alert and oriented to person, place, and time.  Skin: Skin is warm and dry.  Psychiatric: She has a normal mood and affect. Her behavior is normal.          Assessment & Plan:  Bronchitis- discussed no longer use ABX to tx this. Will give rx for cough med. She declined inhaler.  If suddenly gets worse can fill the rx for zpack. Printed. Call if pulm sxs worsen and will consider DXR.

## 2011-08-31 ENCOUNTER — Encounter: Payer: Self-pay | Admitting: Family Medicine

## 2011-09-10 ENCOUNTER — Encounter: Payer: Self-pay | Admitting: Family Medicine

## 2011-10-04 ENCOUNTER — Encounter: Payer: Self-pay | Admitting: Family Medicine

## 2011-10-22 ENCOUNTER — Ambulatory Visit: Payer: BC Managed Care – PPO | Admitting: Physician Assistant

## 2011-10-22 ENCOUNTER — Encounter: Payer: Self-pay | Admitting: Family Medicine

## 2011-10-22 ENCOUNTER — Ambulatory Visit (INDEPENDENT_AMBULATORY_CARE_PROVIDER_SITE_OTHER): Payer: BC Managed Care – PPO | Admitting: Family Medicine

## 2011-10-22 VITALS — BP 141/82 | HR 94 | Temp 98.3°F | Wt 196.0 lb

## 2011-10-22 DIAGNOSIS — E785 Hyperlipidemia, unspecified: Secondary | ICD-10-CM

## 2011-10-22 DIAGNOSIS — J329 Chronic sinusitis, unspecified: Secondary | ICD-10-CM

## 2011-10-22 DIAGNOSIS — I10 Essential (primary) hypertension: Secondary | ICD-10-CM

## 2011-10-22 MED ORDER — OMEGA-3-ACID ETHYL ESTERS 1 G PO CAPS: 1.0000 g | ORAL_CAPSULE | Freq: Two times a day (BID) | ORAL | Status: DC

## 2011-10-22 NOTE — Progress Notes (Signed)
  Subjective:    Patient ID: Loretta Bullock, female    DOB: 01-25-1961, 51 y.o.   MRN: [id number]  Hypertension This is a chronic problem. The current episode started more than 1 year ago. The problem is controlled. Pertinent negatives include no chest pain or shortness of breath. There are no associated agents to hypertension. The current treatment provides moderate improvement. There are no compliance problems.    CC: Nasal congestion and ear pain for one week. Feels maybe a little better today.  Lots of nasal pressure. + HA.  Mild ST initially, now scratchy.  No GI sxs or fever.  Tried a couple of dayquail - Helps some.    Review of Systems  Respiratory: Negative for shortness of breath.   Cardiovascular: Negative for chest pain.       Objective:   Physical Exam  Constitutional: She is oriented to person, place, and time. She appears well-developed and well-nourished.  HENT:  Head: Normocephalic and atraumatic.  Right Ear: External ear normal.  Left Ear: External ear normal.  Nose: Nose normal.  Mouth/Throat: Oropharynx is clear and moist.       TMs and canals are clear.   Eyes: Conjunctivae and EOM are normal. Pupils are equal, round, and reactive to light.  Neck: Neck supple. No thyromegaly present.  Cardiovascular: Normal rate, regular rhythm and normal heart sounds.   Pulmonary/Chest: Effort normal and breath sounds normal. She has no wheezes.  Lymphadenopathy:    She has no cervical adenopathy.  Neurological: She is alert and oriented to person, place, and time.  Skin: Skin is warm and dry.  Psychiatric: She has a normal mood and affect.          Assessment & Plan:  Sinusitis - Likely viral. Call if not better by Monday. Will tx sith ABX if getting worse to not continuing to get better.   HTN- Recheck BP in 2 weeks.  Elevated today but may be from cough and cold medication.  Due fore screening labwork  Hyperlipidemia-has been a year since we last checked her blood  work. We need to recheck this also liver functions and she can't stand. She is tolerating it well.

## 2011-10-25 ENCOUNTER — Telehealth: Payer: Self-pay | Admitting: *Deleted

## 2011-10-25 MED ORDER — AMOXICILLIN 875 MG PO TABS: 875.0000 mg | ORAL_TABLET | Freq: Two times a day (BID) | ORAL | Status: AC

## 2011-10-25 NOTE — Telephone Encounter (Signed)
That K. we'll treat for sinusitis with amoxicillin. She can check her pharmacy later today.

## 2011-10-25 NOTE — Telephone Encounter (Signed)
Pt states she was told to call if she was not feeling any better. States she is feeling worse. Please advise

## 2011-10-25 NOTE — Telephone Encounter (Signed)
Pt informed

## 2011-10-28 ENCOUNTER — Telehealth: Payer: Self-pay | Admitting: *Deleted

## 2011-10-28 MED ORDER — HYDROCODONE-HOMATROPINE 5-1.5 MG/5ML PO SYRP: 5.0000 mL | ORAL_SOLUTION | Freq: Every evening | ORAL | Status: AC | PRN

## 2011-10-28 NOTE — Telephone Encounter (Signed)
Printed rx. OK to fax.  

## 2011-10-28 NOTE — Telephone Encounter (Signed)
Pt states she is still coughing and unable to sleep at night and is on the 4th day of abx. Pt would like to know if you will send her some cough syrup to pharmacy.

## 2011-10-28 NOTE — Telephone Encounter (Signed)
Pt notified and med sent to pharmacy. KJ LPN

## 2011-11-03 ENCOUNTER — Encounter: Payer: Self-pay | Admitting: *Deleted

## 2011-11-05 LAB — LIPID PANEL
Cholesterol: 164 mg/dL (ref 0–200)
HDL: 41 mg/dL (ref >39)
Total CHOL/HDL Ratio: 4 Ratio
VLDL: 29 mg/dL (ref 0–40)

## 2011-11-06 LAB — COMPLETE METABOLIC PANEL WITH GFR
AST: 25 U/L (ref 0–37)
Albumin: 4.6 g/dL (ref 3.5–5.2)
Alkaline Phosphatase: 30 U/L — ABNORMAL LOW (ref 39–117)
Potassium: 3.6 mEq/L (ref 3.5–5.3)
Sodium: 138 mEq/L (ref 135–145)
Total Bilirubin: 0.4 mg/dL (ref 0.3–1.2)
Total Protein: 7 g/dL (ref 6.0–8.3)

## 2011-11-12 ENCOUNTER — Ambulatory Visit: Payer: BC Managed Care – PPO | Admitting: Family Medicine

## 2011-11-12 ENCOUNTER — Encounter: Payer: Self-pay | Admitting: *Deleted

## 2011-11-15 ENCOUNTER — Ambulatory Visit (INDEPENDENT_AMBULATORY_CARE_PROVIDER_SITE_OTHER): Payer: BC Managed Care – PPO | Admitting: Family Medicine

## 2011-11-15 ENCOUNTER — Encounter: Payer: Self-pay | Admitting: Family Medicine

## 2011-11-15 VITALS — BP 136/75 | HR 101 | Temp 98.3°F | Ht 61.0 in | Wt 198.0 lb

## 2011-11-15 DIAGNOSIS — E785 Hyperlipidemia, unspecified: Secondary | ICD-10-CM

## 2011-11-15 DIAGNOSIS — R739 Hyperglycemia, unspecified: Secondary | ICD-10-CM

## 2011-11-15 DIAGNOSIS — E669 Obesity, unspecified: Secondary | ICD-10-CM

## 2011-11-15 DIAGNOSIS — R7309 Other abnormal glucose: Secondary | ICD-10-CM

## 2011-11-15 DIAGNOSIS — I10 Essential (primary) hypertension: Secondary | ICD-10-CM

## 2011-11-15 LAB — POCT GLYCOSYLATED HEMOGLOBIN (HGB A1C): Hemoglobin A1C: 5.4

## 2011-11-15 MED ORDER — CHOLINE FENOFIBRATE 135 MG PO CPDR: 135.0000 mg | DELAYED_RELEASE_CAPSULE | Freq: Every day | ORAL | Status: DC

## 2011-11-15 NOTE — Progress Notes (Signed)
  Subjective:    Patient ID: Loretta Bullock, female    DOB: 12/24/1960, 51 y.o.   MRN: [id number]  Hypertension This is a chronic problem. The current episode started more than 1 year ago. The problem is controlled. Pertinent negatives include no chest pain or shortness of breath. There are no associated agents to hypertension. Risk factors for coronary artery disease include obesity. Past treatments include diuretics. The current treatment provides mild improvement. Compliance problems include exercise.       Review of Systems  Respiratory: Negative for shortness of breath.   Cardiovascular: Negative for chest pain.       Objective:   Physical Exam  Constitutional: She is oriented to person, place, and time. She appears well-developed and well-nourished.  HENT:  Head: Normocephalic and atraumatic.  Cardiovascular: Normal rate, regular rhythm and normal heart sounds.   Pulmonary/Chest: Effort normal and breath sounds normal.  Neurological: She is alert and oriented to person, place, and time.  Skin: Skin is warm and dry.  Psychiatric: She has a normal mood and affect. Her behavior is normal.          Assessment & Plan:  HTN - Much better. Encouraged regular exercise. Continue current regimen. F/U in 6 months.   Hyperlipidemai - needs refill on trilipix. Lab Results  Component Value Date   CHOL 164 11/05/2011   HDL 41 11/05/2011   LDLCALC 94 11/05/2011   TRIG 147 11/05/2011   CHOLHDL 4.0 11/05/2011      Abnormal glucose-she's had elevated blood sugar on her last 2 fasting labs. We will check an A1c today. We also discussed the importance of getting regular exercise and weight loss. She says is his wish to consider within about a month she plans on getting back to the gym and working out. A1C is 5.4 which is normal. Gave ressurance.   Due for mammogram. She plans to schedule this spring.

## 2012-01-06 ENCOUNTER — Other Ambulatory Visit: Payer: Self-pay | Admitting: Family Medicine

## 2012-01-18 ENCOUNTER — Other Ambulatory Visit (HOSPITAL_COMMUNITY): Payer: Self-pay | Admitting: Obstetrics and Gynecology

## 2012-01-18 DIAGNOSIS — Z1231 Encounter for screening mammogram for malignant neoplasm of breast: Secondary | ICD-10-CM

## 2012-02-10 ENCOUNTER — Ambulatory Visit (HOSPITAL_COMMUNITY)
Admission: RE | Admit: 2012-02-10 | Discharge: 2012-02-10 | Disposition: A | Discharge status: Home / Self Care | Payer: BC Managed Care – PPO | Source: Ambulatory Visit | Attending: Obstetrics and Gynecology | Admitting: Obstetrics and Gynecology

## 2012-02-10 DIAGNOSIS — Z1231 Encounter for screening mammogram for malignant neoplasm of breast: Secondary | ICD-10-CM | POA: Insufficient documentation

## 2012-05-18 ENCOUNTER — Encounter: Payer: Self-pay | Admitting: Family Medicine

## 2012-05-18 ENCOUNTER — Ambulatory Visit (INDEPENDENT_AMBULATORY_CARE_PROVIDER_SITE_OTHER): Payer: BC Managed Care – PPO | Admitting: Family Medicine

## 2012-05-18 VITALS — BP 141/90 | HR 97 | Wt 201.0 lb

## 2012-05-18 DIAGNOSIS — I10 Essential (primary) hypertension: Secondary | ICD-10-CM

## 2012-05-18 NOTE — Progress Notes (Signed)
  Subjective:    Patient ID: Loretta Bullock, female    DOB: [birth year], 51 y.o.   MRN: [id number]  HPI HTN- No CP or SOb. Eating low salt diet.  No regular exercise righ tnow.  Says plans on starting to work out today. Taking meds regularly.  Says has had more frequent HA last couple of weeks.  She also complains a little more swelling in her ankles than usual. She has gained 3 pounds since I last saw her 6 months ago.   Review of Systems     Objective:   Physical Exam  Constitutional: She is oriented to person, place, and time. She appears well-developed and well-nourished.  HENT:  Head: Normocephalic and atraumatic.  Cardiovascular: Normal rate, regular rhythm and normal heart sounds.   Pulmonary/Chest: Effort normal and breath sounds normal.  Neurological: She is alert and oriented to person, place, and time.  Skin: Skin is warm and dry.  Psychiatric: She has a normal mood and affect. Her behavior is normal.          Assessment & Plan:  HTN- Not well controlled. Increase losartan to 160/25mg . She says has plenty of tabs at home so will just take 2. Will call if need new rx.  F/U in 8 weeks. She plans on joining a gym to start exercising and working on losing weight. Continue work on low-salt diet. Check BMP in one week after she increases her dose.

## 2012-06-30 ENCOUNTER — Encounter: Payer: Self-pay | Admitting: Family Medicine

## 2012-06-30 ENCOUNTER — Ambulatory Visit (INDEPENDENT_AMBULATORY_CARE_PROVIDER_SITE_OTHER): Payer: BC Managed Care – PPO | Admitting: Family Medicine

## 2012-06-30 VITALS — BP 155/98 | HR 87 | Ht 60.0 in | Wt 205.0 lb

## 2012-06-30 DIAGNOSIS — R609 Edema, unspecified: Secondary | ICD-10-CM

## 2012-06-30 DIAGNOSIS — R51 Headache: Secondary | ICD-10-CM

## 2012-06-30 DIAGNOSIS — I10 Essential (primary) hypertension: Secondary | ICD-10-CM

## 2012-06-30 DIAGNOSIS — J309 Allergic rhinitis, unspecified: Secondary | ICD-10-CM

## 2012-06-30 MED ORDER — FLUTICASONE PROPIONATE 50 MCG/ACT NA SUSP: 2.0000 | Freq: Every day | NASAL | Status: DC

## 2012-06-30 MED ORDER — FUROSEMIDE 20 MG PO TABS: 20.0000 mg | ORAL_TABLET | Freq: Every day | ORAL | Status: DC | PRN

## 2012-06-30 MED ORDER — VALSARTAN-HYDROCHLOROTHIAZIDE 160-25 MG PO TABS: 1.0000 | ORAL_TABLET | Freq: Every day | ORAL | Status: DC

## 2012-06-30 NOTE — Progress Notes (Signed)
  Subjective:    Patient ID: Loretta Bullock, female    DOB: [birth year], 51 y.o.   MRN: [id number]  HPI Hasn't felt for the last 2 weeks. Says has allergies this time of year. Taking Allegra and helps. Also has been having more frequent HA. Has been using the nasal wash.  Has been swollen around Loretta Bullock ankles for last 3-4 days. Says has been tyring to watch the salt in Loretta Bullock diet.  No cough.  Had a URI about 2 weeks ago.  No CP or SOB.  Ears have  Been tender. No drainge.  No ST.  Loretta Bullock did take Advil today. Loretta Bullock has been avoiding decongestants.  Hypertension-no chest pain or shortness of breath. Loretta Bullock has been taking 2 tabs of the Diovan HCT. Because Loretta Bullock has a lot left over. Loretta Bullock's been tolerating it well. Loretta Bullock has not really noticed an increase in frequency of urination. Loretta Bullock has had a couple of episodes of lightheadedness.   Review of Systems     Objective:   Physical Exam  Constitutional: Loretta Bullock is oriented to person, place, and time. Loretta Bullock appears well-developed and well-nourished.  HENT:  Head: Normocephalic and atraumatic.  Right Ear: External ear normal.  Left Ear: External ear normal.  Nose: Nose normal.  Mouth/Throat: Oropharynx is clear and moist.       TMs and canals are clear.   Eyes: Conjunctivae normal and EOM are normal. Pupils are equal, round, and reactive to light.  Neck: Neck supple. No thyromegaly present.  Cardiovascular: Normal rate, regular rhythm and normal heart sounds.   Pulmonary/Chest: Effort normal and breath sounds normal. Loretta Bullock has no wheezes.  Lymphadenopathy:    Loretta Bullock has no cervical adenopathy.  Neurological: Loretta Bullock is alert and oriented to person, place, and time.  Skin: Skin is warm and dry.  Psychiatric: Loretta Bullock has a normal mood and affect.          Assessment & Plan:  HTN- uncontrolled. I really think that Loretta Bullock uncontrolled blood pressure they may be in part because of Loretta Bullock headache. Loretta Bullock also took Advil this morning which could be contributing. Loretta Bullock's also noticed some  fluid retention of the last 3-4 days and that can also about blood pressures well. I gave Loretta Bullock prescription for Lasix to take just over the weekend one tab on Saturday and one tab on Sunday. We also discussed again making sure that Loretta Bullock's really eating a low salt diet as much as possible. I would like Loretta Bullock come back in one week for nurse blood pressure check to see if it's coming down after we are able to diureses Loretta Bullock period and encouraged Loretta Bullock to try to avoid Advil and ibuprofen especially on the day that Loretta Bullock's coming in to have Loretta Bullock blood pressure check. We will check a CMP today because Loretta Bullock has had the swelling also make sure that Loretta Bullock potassium and kidney function are normal on the increased dose of Loretta Bullock ACE inhibitor and diuretic. Loretta Bullock weight is also up 2 pounds which could be water weight.  AR- I. really think Synetta Fail a little tighter control of Loretta Bullock allergies. This certainly could be contributing to Loretta Bullock headaches. Will add fluticasone which Loretta Bullock has used in the past to Loretta Bullock Allegra. Loretta Bullock to get a couple days to really start working well and let me know if Loretta Bullock still having headaches.  Headaches-could be coming from Loretta Bullock allergic rhinitis versus Loretta Bullock elevated blood pressure.

## 2012-07-01 LAB — COMPLETE METABOLIC PANEL WITH GFR
Alkaline Phosphatase: 34 U/L — ABNORMAL LOW (ref 39–117)
CO2: 29 mEq/L (ref 19–32)
Creat: 1.11 mg/dL — ABNORMAL HIGH (ref 0.50–1.10)
GFR, Est African American: 66 mL/min
GFR, Est Non African American: 58 mL/min — ABNORMAL LOW
Glucose, Bld: 82 mg/dL (ref 70–99)
Sodium: 139 mEq/L (ref 135–145)
Total Bilirubin: 0.3 mg/dL (ref 0.3–1.2)
Total Protein: 6.7 g/dL (ref 6.0–8.3)

## 2012-07-03 ENCOUNTER — Other Ambulatory Visit: Payer: Self-pay | Admitting: Family Medicine

## 2012-07-03 DIAGNOSIS — N289 Disorder of kidney and ureter, unspecified: Secondary | ICD-10-CM

## 2012-07-05 ENCOUNTER — Telehealth: Payer: Self-pay | Admitting: *Deleted

## 2012-07-05 NOTE — Telephone Encounter (Signed)
Patient aware to do follow up labs since she does not want the CT scan.

## 2012-07-27 ENCOUNTER — Telehealth: Payer: Self-pay | Admitting: *Deleted

## 2012-07-27 DIAGNOSIS — I10 Essential (primary) hypertension: Secondary | ICD-10-CM

## 2012-07-27 NOTE — Telephone Encounter (Signed)
Lab entered

## 2012-08-01 LAB — COMPLETE METABOLIC PANEL WITH GFR
ALT: 32 U/L (ref 0–35)
AST: 29 U/L (ref 0–37)
Albumin: 4.7 g/dL (ref 3.5–5.2)
Alkaline Phosphatase: 30 U/L — ABNORMAL LOW (ref 39–117)
BUN: 13 mg/dL (ref 6–23)
Calcium: 10.7 mg/dL — ABNORMAL HIGH (ref 8.4–10.5)
Chloride: 98 mEq/L (ref 96–112)
Potassium: 3.8 mEq/L (ref 3.5–5.3)
Sodium: 135 mEq/L (ref 135–145)
Total Protein: 6.8 g/dL (ref 6.0–8.3)

## 2012-08-02 ENCOUNTER — Encounter: Payer: Self-pay | Admitting: Family Medicine

## 2012-08-02 ENCOUNTER — Ambulatory Visit (INDEPENDENT_AMBULATORY_CARE_PROVIDER_SITE_OTHER): Payer: BC Managed Care – PPO | Admitting: Family Medicine

## 2012-08-02 VITALS — BP 127/80 | HR 94 | Ht 61.0 in | Wt 198.0 lb

## 2012-08-02 DIAGNOSIS — J069 Acute upper respiratory infection, unspecified: Secondary | ICD-10-CM

## 2012-08-02 DIAGNOSIS — I10 Essential (primary) hypertension: Secondary | ICD-10-CM

## 2012-08-02 NOTE — Progress Notes (Signed)
  Subjective:    Patient ID: Loretta Bullock, female    DOB: Oct 06, 1960, 51 y.o.   MRN: [id number]  HPI HTN - No CP or SOB.  Has been trying to watch salt in diet. Has cut out sodas and has lost 7 lbs.   ST initially and now has a cough.  Started about 4 days ago.  Hurts to cough.  + nasal congestion.  No GI symptoms. No fever. She says she's been very careful about not taking any other cold medicines because she doesn't want to affect her blood pressure. Her son and her husband have had similar symptoms.    Review of Systems     Objective:   Physical Exam  Constitutional: She is oriented to person, place, and time. She appears well-developed and well-nourished.  HENT:  Head: Normocephalic and atraumatic.  Right Ear: External ear normal.  Left Ear: External ear normal.  Nose: Nose normal.  Mouth/Throat: Oropharynx is clear and moist.       TMs and canals are clear.   Eyes: Conjunctivae normal and EOM are normal. Pupils are equal, round, and reactive to light.  Neck: Neck supple. No thyromegaly present.  Cardiovascular: Normal rate, regular rhythm and normal heart sounds.   Pulmonary/Chest: Effort normal and breath sounds normal. She has no wheezes.  Lymphadenopathy:    She has no cervical adenopathy.  Neurological: She is alert and oriented to person, place, and time.  Skin: Skin is warm and dry.  Psychiatric: She has a normal mood and affect. Her behavior is normal.          Assessment & Plan:  HTN - Well controlled today.  Keep it up. Followup in 4 months..   Chemistry      Component Value Date/Time   NA 135 07/31/2012 0840   K 3.8 07/31/2012 0840   CL 98 07/31/2012 0840   CO2 28 07/31/2012 0840   BUN 13 07/31/2012 0840   CREATININE 1.02 07/31/2012 0840   CREATININE 1.17 11/05/2010 1842      Component Value Date/Time   CALCIUM 10.7* 07/31/2012 0840   ALKPHOS 30* 07/31/2012 0840   AST 29 07/31/2012 0840   ALT 32 07/31/2012 0840   BILITOT 0.5 07/31/2012 0840      Lab Results  Component Value Date   CHOL 164 11/05/2011   HDL 41 11/05/2011   LDLCALC 94 11/05/2011   TRIG 147 11/05/2011   CHOLHDL 4.0 11/05/2011      URI - Likely viral. Call if not better on Monday.  Continue symptomatically her.Chest is clear on exam.

## 2012-08-30 ENCOUNTER — Other Ambulatory Visit: Payer: Self-pay | Admitting: Family Medicine

## 2012-10-13 ENCOUNTER — Other Ambulatory Visit: Payer: Self-pay | Admitting: Family Medicine

## 2012-12-09 ENCOUNTER — Other Ambulatory Visit: Payer: Self-pay | Admitting: Family Medicine

## 2013-01-11 ENCOUNTER — Encounter: Payer: Self-pay | Admitting: Sports Medicine

## 2013-01-11 ENCOUNTER — Ambulatory Visit (INDEPENDENT_AMBULATORY_CARE_PROVIDER_SITE_OTHER): Payer: BC Managed Care – PPO | Admitting: Sports Medicine

## 2013-01-11 ENCOUNTER — Ambulatory Visit (INDEPENDENT_AMBULATORY_CARE_PROVIDER_SITE_OTHER): Payer: BC Managed Care – PPO

## 2013-01-11 VITALS — BP 139/85 | HR 110 | Wt 202.0 lb

## 2013-01-11 DIAGNOSIS — M47817 Spondylosis without myelopathy or radiculopathy, lumbosacral region: Secondary | ICD-10-CM

## 2013-01-11 DIAGNOSIS — M545 Low back pain, unspecified: Secondary | ICD-10-CM

## 2013-01-11 DIAGNOSIS — M5137 Other intervertebral disc degeneration, lumbosacral region: Secondary | ICD-10-CM

## 2013-01-11 DIAGNOSIS — M5136 Other intervertebral disc degeneration, lumbar region: Secondary | ICD-10-CM | POA: Insufficient documentation

## 2013-01-11 DIAGNOSIS — M51369 Other intervertebral disc degeneration, lumbar region without mention of lumbar back pain or lower extremity pain: Secondary | ICD-10-CM | POA: Insufficient documentation

## 2013-01-11 MED ORDER — PREDNISONE 50 MG PO TABS: ORAL_TABLET | ORAL | Status: DC

## 2013-01-11 MED ORDER — MELOXICAM 15 MG PO TABS: ORAL_TABLET | ORAL | Status: DC

## 2013-01-11 NOTE — Progress Notes (Signed)
  Subjective:    CC: Back pain  HPI: Loretta Bullock is a very pleasant 52 year old female comes in with a fairly long history of pain in her back, on and off. She commonly gets pain radiating down her right leg in an L5 distribution, and more recently has developed pain radiating down her left buttock but not past the mid. Pain is worse with flexion, better with extension, worse when getting up from a seated position and when sitting in a car for long periods of time. It is not worse with Valsalva. She denies any bowel or bladder dysfunction or saddle anesthesia. Denies any constitutional symptoms. Has done some ibuprofen does help slightly.  Past medical history, Surgical history, Family history not pertinant except as noted below, Social history, Allergies, and medications have been entered into the medical record, reviewed, and no changes needed.   Review of Systems: No fevers, chills, night sweats, weight loss, chest pain, or shortness of breath.   Objective:    General: Well Developed, well nourished, and in no acute distress.  Neuro: Alert and oriented x3, extra-ocular muscles intact, sensation grossly intact.  HEENT: Normocephalic, atraumatic, pupils equal round reactive to light, neck supple, no masses, no lymphadenopathy, thyroid nonpalpable.  Skin: Warm and dry, no rashes. Cardiac: Regular rate and rhythm, no murmurs rubs or gallops, no lower extremity edema.  Respiratory: Clear to auscultation bilaterally. Not using accessory muscles, speaking in full sentences. Back Exam:  Inspection: Unremarkable  Motion: Flexion 45 deg, Extension 45 deg, Side Bending to 45 deg bilaterally,  Rotation to 45 deg bilaterally  SLR laying: Negative  XSLR laying: Negative  Palpable tenderness: None. FABER: negative. Sensory change: Gross sensation intact to all lumbar and sacral dermatomes.  Reflexes: 2+ at both patellar tendons, 2+ at achilles tendons, Babinski's downgoing.  Strength at foot    Plantar-flexion: 5/5 Dorsi-flexion: 5/5 Eversion: 5/5 Inversion: 5/5  Leg strength  Quad: 5/5 Hamstring: 5/5 Hip flexor: 5/5 Hip abductors: 5/5  Gait unremarkable. Impression and Recommendations:

## 2013-01-11 NOTE — Assessment & Plan Note (Signed)
Pain does radiate down the right leg in an L5 distribution, and occasionally down into the left buttock but not past the mid thigh. Am also not getting a history consistent with spinal stenosis. I still do think that this is discogenic, and we will start conservatively. Mobic, prednisone, formal physical therapy, x-rays. Return in 4 weeks.

## 2013-01-23 ENCOUNTER — Telehealth: Payer: Self-pay | Admitting: *Deleted

## 2013-01-23 MED ORDER — CYCLOBENZAPRINE HCL 10 MG PO TABS: ORAL_TABLET | ORAL | Status: DC

## 2013-01-23 MED ORDER — HYDROCODONE-ACETAMINOPHEN 5-325 MG PO TABS: 1.0000 | ORAL_TABLET | Freq: Three times a day (TID) | ORAL | Status: DC | PRN

## 2013-01-23 NOTE — Telephone Encounter (Signed)
Rx in my outbox

## 2013-01-23 NOTE — Telephone Encounter (Signed)
Pt called stating that she was having severe back pain and she is suppose to start PT tomorrow. Informed pt we would print out rx for Flexeril and pain med and for her to come after lunch to pick up scripts and to still go to PT tomorrow. Pt agreed and will be here around 1:30pm to pick up.

## 2013-01-24 ENCOUNTER — Ambulatory Visit: Payer: BC Managed Care – PPO | Admitting: Physical Therapy

## 2013-01-24 DIAGNOSIS — M2560 Stiffness of unspecified joint, not elsewhere classified: Secondary | ICD-10-CM

## 2013-01-24 DIAGNOSIS — M6281 Muscle weakness (generalized): Secondary | ICD-10-CM

## 2013-01-24 DIAGNOSIS — M545 Low back pain: Secondary | ICD-10-CM

## 2013-01-24 DIAGNOSIS — M2550 Pain in unspecified joint: Secondary | ICD-10-CM

## 2013-01-26 ENCOUNTER — Encounter: Payer: BC Managed Care – PPO | Admitting: Physical Therapy

## 2013-01-30 ENCOUNTER — Encounter: Payer: BC Managed Care – PPO | Admitting: Physical Therapy

## 2013-01-30 DIAGNOSIS — M2550 Pain in unspecified joint: Secondary | ICD-10-CM

## 2013-01-30 DIAGNOSIS — M2560 Stiffness of unspecified joint, not elsewhere classified: Secondary | ICD-10-CM

## 2013-01-30 DIAGNOSIS — M6281 Muscle weakness (generalized): Secondary | ICD-10-CM

## 2013-01-30 DIAGNOSIS — M545 Low back pain: Secondary | ICD-10-CM

## 2013-02-02 ENCOUNTER — Encounter: Payer: BC Managed Care – PPO | Admitting: Physical Therapy

## 2013-02-02 DIAGNOSIS — M6281 Muscle weakness (generalized): Secondary | ICD-10-CM

## 2013-02-02 DIAGNOSIS — M2560 Stiffness of unspecified joint, not elsewhere classified: Secondary | ICD-10-CM

## 2013-02-02 DIAGNOSIS — M545 Low back pain, unspecified: Secondary | ICD-10-CM

## 2013-02-02 DIAGNOSIS — M2550 Pain in unspecified joint: Secondary | ICD-10-CM

## 2013-02-06 ENCOUNTER — Encounter: Payer: BC Managed Care – PPO | Admitting: Physical Therapy

## 2013-02-06 DIAGNOSIS — M2550 Pain in unspecified joint: Secondary | ICD-10-CM

## 2013-02-06 DIAGNOSIS — M2560 Stiffness of unspecified joint, not elsewhere classified: Secondary | ICD-10-CM

## 2013-02-06 DIAGNOSIS — M6281 Muscle weakness (generalized): Secondary | ICD-10-CM

## 2013-02-06 DIAGNOSIS — M545 Low back pain: Secondary | ICD-10-CM

## 2013-02-08 ENCOUNTER — Telehealth: Payer: Self-pay | Admitting: *Deleted

## 2013-02-08 ENCOUNTER — Ambulatory Visit (INDEPENDENT_AMBULATORY_CARE_PROVIDER_SITE_OTHER): Payer: BC Managed Care – PPO | Admitting: Sports Medicine

## 2013-02-08 ENCOUNTER — Encounter: Payer: Self-pay | Admitting: Sports Medicine

## 2013-02-08 VITALS — BP 130/75 | HR 107 | Wt 197.0 lb

## 2013-02-08 DIAGNOSIS — M545 Low back pain, unspecified: Secondary | ICD-10-CM

## 2013-02-08 MED ORDER — CYCLOBENZAPRINE HCL 10 MG PO TABS: ORAL_TABLET | ORAL | Status: DC

## 2013-02-08 NOTE — Progress Notes (Signed)
  Subjective:    CC: Follow up  HPI: This pleasant 52 year old who comes back for followup of low back pain with bilateral radiculitis after physical therapy, oral steroids, NSAIDs, muscle relaxers. Overall she reports her pain is really not much better, she does however need a refill of her Flexeril. She has 3 more sessions of PT. Pain is worse in the low back, radiating down both legs and L5 versus an S1 distribution, dorsiflexion and Valsalva. Pain is moderate, stable.  Past medical history, Surgical history, Family history not pertinant except as noted below, Social history, Allergies, and medications have been entered into the medical record, reviewed, and no changes needed.   Review of Systems: No fevers, chills, night sweats, weight loss, chest pain, or shortness of breath.   Objective:    General: Well Developed, well nourished, and in no acute distress.  Neuro: Alert and oriented x3, extra-ocular muscles intact, sensation grossly intact.  HEENT: Normocephalic, atraumatic, pupils equal round reactive to light, neck supple, no masses, no lymphadenopathy, thyroid nonpalpable.  Skin: Warm and dry, no rashes. Cardiac: Regular rate and rhythm, no murmurs rubs or gallops, no lower extremity edema.  Respiratory: Clear to auscultation bilaterally. Not using accessory muscles, speaking in full sentences.  Impression and Recommendations:

## 2013-02-08 NOTE — Telephone Encounter (Signed)
Precert not required for MR Lumbar spine w/o contrast due to pt's insurance.

## 2013-02-08 NOTE — Assessment & Plan Note (Signed)
MRI L-spine. Return to go over results. Refilling flexeril.

## 2013-02-09 ENCOUNTER — Encounter: Payer: BC Managed Care – PPO | Admitting: Physical Therapy

## 2013-02-09 DIAGNOSIS — M2560 Stiffness of unspecified joint, not elsewhere classified: Secondary | ICD-10-CM

## 2013-02-09 DIAGNOSIS — M545 Low back pain: Secondary | ICD-10-CM

## 2013-02-09 DIAGNOSIS — M2550 Pain in unspecified joint: Secondary | ICD-10-CM

## 2013-02-09 DIAGNOSIS — M6281 Muscle weakness (generalized): Secondary | ICD-10-CM

## 2013-02-13 ENCOUNTER — Encounter: Payer: BC Managed Care – PPO | Admitting: Physical Therapy

## 2013-02-13 DIAGNOSIS — M545 Low back pain: Secondary | ICD-10-CM

## 2013-02-13 DIAGNOSIS — M6281 Muscle weakness (generalized): Secondary | ICD-10-CM

## 2013-02-13 DIAGNOSIS — M2560 Stiffness of unspecified joint, not elsewhere classified: Secondary | ICD-10-CM

## 2013-02-13 DIAGNOSIS — M2550 Pain in unspecified joint: Secondary | ICD-10-CM

## 2013-02-15 ENCOUNTER — Encounter: Payer: BC Managed Care – PPO | Admitting: Physical Therapy

## 2013-02-15 DIAGNOSIS — M545 Low back pain: Secondary | ICD-10-CM

## 2013-02-15 DIAGNOSIS — M2560 Stiffness of unspecified joint, not elsewhere classified: Secondary | ICD-10-CM

## 2013-02-15 DIAGNOSIS — M6281 Muscle weakness (generalized): Secondary | ICD-10-CM

## 2013-02-15 DIAGNOSIS — M2550 Pain in unspecified joint: Secondary | ICD-10-CM

## 2013-02-19 ENCOUNTER — Encounter: Payer: BC Managed Care – PPO | Admitting: Physical Therapy

## 2013-02-19 DIAGNOSIS — M6281 Muscle weakness (generalized): Secondary | ICD-10-CM

## 2013-02-19 DIAGNOSIS — M545 Low back pain: Secondary | ICD-10-CM

## 2013-02-19 DIAGNOSIS — M2550 Pain in unspecified joint: Secondary | ICD-10-CM

## 2013-02-19 DIAGNOSIS — M2560 Stiffness of unspecified joint, not elsewhere classified: Secondary | ICD-10-CM

## 2013-02-20 ENCOUNTER — Other Ambulatory Visit: Payer: Self-pay | Admitting: Sports Medicine

## 2013-02-20 ENCOUNTER — Ambulatory Visit (INDEPENDENT_AMBULATORY_CARE_PROVIDER_SITE_OTHER): Payer: BC Managed Care – PPO

## 2013-02-20 DIAGNOSIS — M5126 Other intervertebral disc displacement, lumbar region: Secondary | ICD-10-CM

## 2013-02-20 DIAGNOSIS — M4800 Spinal stenosis, site unspecified: Secondary | ICD-10-CM

## 2013-02-20 DIAGNOSIS — M48061 Spinal stenosis, lumbar region without neurogenic claudication: Secondary | ICD-10-CM

## 2013-02-20 DIAGNOSIS — M545 Low back pain: Secondary | ICD-10-CM

## 2013-02-21 ENCOUNTER — Encounter: Payer: BC Managed Care – PPO | Admitting: Physical Therapy

## 2013-02-21 DIAGNOSIS — M2560 Stiffness of unspecified joint, not elsewhere classified: Secondary | ICD-10-CM

## 2013-02-21 DIAGNOSIS — M545 Low back pain: Secondary | ICD-10-CM

## 2013-02-21 DIAGNOSIS — M6281 Muscle weakness (generalized): Secondary | ICD-10-CM

## 2013-02-21 DIAGNOSIS — M2550 Pain in unspecified joint: Secondary | ICD-10-CM

## 2013-02-22 ENCOUNTER — Encounter: Payer: Self-pay | Admitting: Sports Medicine

## 2013-02-22 ENCOUNTER — Ambulatory Visit (INDEPENDENT_AMBULATORY_CARE_PROVIDER_SITE_OTHER): Payer: BC Managed Care – PPO | Admitting: Sports Medicine

## 2013-02-22 VITALS — BP 133/89 | HR 99 | Wt 197.0 lb

## 2013-02-22 DIAGNOSIS — M545 Low back pain, unspecified: Secondary | ICD-10-CM

## 2013-02-22 NOTE — Progress Notes (Signed)
  Subjective:    CC: Follow up  HPI: This pleasant 52 year old female comes back to followup for low back pain as well as MRI results. To recap, she has pain that is discogenic, radiating down the right leg in an L4 distribution, she also notes pain with standing straight, and feels that she is slightly better in a slightly flexed position. She denies any symptoms of neurogenic claudication. Pain is moderate, persistent. She did get good benefit when she was on the prednisone, and has noted some benefit with physical therapy, but desires to proceed with interventional treatment.  Past medical history, Surgical history, Family history not pertinant except as noted below, Social history, Allergies, and medications have been entered into the medical record, reviewed, and no changes needed.   Review of Systems: No fevers, chills, night sweats, weight loss, chest pain, or shortness of breath.   Objective:    General: Well Developed, well nourished, and in no acute distress.  Neuro: Alert and oriented x3, extra-ocular muscles intact, sensation grossly intact.  HEENT: Normocephalic, atraumatic, pupils equal round reactive to light, neck supple, no masses, no lymphadenopathy, thyroid nonpalpable.  Skin: Warm and dry, no rashes. Cardiac: Regular rate and rhythm, no murmurs rubs or gallops, no lower extremity edema.  Respiratory: Clear to auscultation bilaterally. Not using accessory muscles, speaking in full sentences.  MRI was reviewed personally, there is a large disc protrusion on the right side of the L3-L4 level, there is a moderate broad-based disc protrusion at the L4-L5 level causing bilateral foraminal stenosis as well as spinal stenosis, there is also a smaller broad-based disc protrusion at the L5-S1 level.  Impression and Recommendations:

## 2013-02-22 NOTE — Assessment & Plan Note (Addendum)
MRI does show multilevel degenerative disc disease, with a large disc protrusion at the L3-L4 level on the right, as well as a large broad-based distribution at the L4-L5 level causing bilateral foraminal stenosis as well as spinal stenosis. There is a small protrusion at the L5-S1 level. As her symptoms are referred predominately to the right L4 nerve root as well as symptoms suggestive of spinal stenosis, I would like to perform a right transforaminal L3/L4 epidural steroid injection, as well as a right-sided L4-L5 interlaminar epidural steroid injection if possible. If not, then ideally we would do a right-sided L4-L5 transforaminal injection. I'd like to her back approximately one week after the injections to assess response.

## 2013-02-26 ENCOUNTER — Encounter: Payer: BC Managed Care – PPO | Admitting: Physical Therapy

## 2013-02-26 DIAGNOSIS — M6281 Muscle weakness (generalized): Secondary | ICD-10-CM

## 2013-02-26 DIAGNOSIS — M25549 Pain in joints of unspecified hand: Secondary | ICD-10-CM

## 2013-02-26 DIAGNOSIS — M25649 Stiffness of unspecified hand, not elsewhere classified: Secondary | ICD-10-CM

## 2013-02-26 DIAGNOSIS — M545 Low back pain: Secondary | ICD-10-CM

## 2013-02-27 ENCOUNTER — Ambulatory Visit
Admission: RE | Admit: 2013-02-27 | Discharge: 2013-02-27 | Disposition: A | Discharge status: Home / Self Care | Payer: BC Managed Care – PPO | Source: Ambulatory Visit | Attending: Sports Medicine | Admitting: Sports Medicine

## 2013-02-27 MED ORDER — IOHEXOL 180 MG/ML  SOLN
1.0000 mL | Freq: Once | INTRAMUSCULAR | Status: AC | PRN
  Administered 2013-02-27: 1 mL via EPIDURAL

## 2013-02-27 MED ORDER — METHYLPREDNISOLONE ACETATE 40 MG/ML INJ SUSP (RADIOLOG
120.0000 mg | Freq: Once | INTRAMUSCULAR | Status: AC
  Administered 2013-02-27: 120 mg via EPIDURAL

## 2013-02-28 ENCOUNTER — Encounter: Payer: BC Managed Care – PPO | Admitting: Physical Therapy

## 2013-03-02 ENCOUNTER — Encounter: Payer: BC Managed Care – PPO | Admitting: Physical Therapy

## 2013-03-02 DIAGNOSIS — M2550 Pain in unspecified joint: Secondary | ICD-10-CM

## 2013-03-02 DIAGNOSIS — M6281 Muscle weakness (generalized): Secondary | ICD-10-CM

## 2013-03-02 DIAGNOSIS — M545 Low back pain: Secondary | ICD-10-CM

## 2013-03-02 DIAGNOSIS — M2560 Stiffness of unspecified joint, not elsewhere classified: Secondary | ICD-10-CM

## 2013-03-07 ENCOUNTER — Ambulatory Visit (INDEPENDENT_AMBULATORY_CARE_PROVIDER_SITE_OTHER): Payer: BC Managed Care – PPO | Admitting: Sports Medicine

## 2013-03-07 ENCOUNTER — Encounter: Payer: Self-pay | Admitting: Sports Medicine

## 2013-03-07 VITALS — BP 139/82 | HR 94 | Wt 193.0 lb

## 2013-03-07 DIAGNOSIS — M545 Low back pain, unspecified: Secondary | ICD-10-CM

## 2013-03-07 NOTE — Assessment & Plan Note (Signed)
Symptoms are 70% improved after initial left-sided L4-L5 interlaminar epidural injection. She does desire to repeat, I would prefer a left-sided L4-L5 transforaminal injection this time. Certainly if her axial pain continues, we need to remember that she has a protrusion at the L3-L4 level as well as severe facet spondylosis at the L4-L5 level bilaterally.  These can be future targets for intervention.

## 2013-03-07 NOTE — Progress Notes (Signed)
  Subjective:    CC: Followup  HPI: Lumbar radiculitis: Nicholaus Bloom had her first left-sided L4-L5 interlaminar epidural steroid injection. She tells me that she has had approximately 70% resolution of pain and desires to proceed with the second injection. Pain is localized, mild, radiates in an L4 distribution, stable.  Past medical history, Surgical history, Family history not pertinant except as noted below, Social history, Allergies, and medications have been entered into the medical record, reviewed, and no changes needed.   Review of Systems: No fevers, chills, night sweats, weight loss, chest pain, or shortness of breath.   Objective:    General: Well Developed, well nourished, and in no acute distress.  Neuro: Alert and oriented x3, extra-ocular muscles intact, sensation grossly intact.  HEENT: Normocephalic, atraumatic, pupils equal round reactive to light, neck supple, no masses, no lymphadenopathy, thyroid nonpalpable.  Skin: Warm and dry, no rashes. Cardiac: Regular rate and rhythm, no murmurs rubs or gallops, no lower extremity edema.  Respiratory: Clear to auscultation bilaterally. Not using accessory muscles, speaking in full sentences Impression and Recommendations:

## 2013-03-14 ENCOUNTER — Inpatient Hospital Stay: Admission: RE | Admit: 2013-03-14 | Payer: BC Managed Care – PPO | Source: Ambulatory Visit

## 2013-03-15 ENCOUNTER — Other Ambulatory Visit: Payer: Self-pay | Admitting: Family Medicine

## 2013-03-15 ENCOUNTER — Ambulatory Visit
Admission: RE | Admit: 2013-03-15 | Discharge: 2013-03-15 | Disposition: A | Discharge status: Home / Self Care | Payer: BC Managed Care – PPO | Source: Ambulatory Visit | Attending: Sports Medicine | Admitting: Sports Medicine

## 2013-03-15 MED ORDER — IOHEXOL 180 MG/ML  SOLN
1.0000 mL | Freq: Once | INTRAMUSCULAR | Status: AC | PRN
  Administered 2013-03-15: 1 mL via EPIDURAL

## 2013-03-15 MED ORDER — METHYLPREDNISOLONE ACETATE 40 MG/ML INJ SUSP (RADIOLOG
120.0000 mg | Freq: Once | INTRAMUSCULAR | Status: AC
  Administered 2013-03-15: 120 mg via EPIDURAL

## 2013-03-16 ENCOUNTER — Telehealth: Payer: Self-pay | Admitting: *Deleted

## 2013-03-16 ENCOUNTER — Other Ambulatory Visit: Payer: Self-pay | Admitting: Sports Medicine

## 2013-03-16 MED ORDER — HYDROCODONE-ACETAMINOPHEN 5-325 MG PO TABS: 1.0000 | ORAL_TABLET | Freq: Three times a day (TID) | ORAL | Status: DC | PRN

## 2013-03-16 NOTE — Telephone Encounter (Signed)
Patient calls and sttaes the pharmacy told her you denied her pain med refill Hydrocodone. Patient is going out of town and wants to make sure she has enough and doesn't run out while gone. Has #13 left of the #40 she las t got. Please advise

## 2013-03-16 NOTE — Telephone Encounter (Signed)
I will refill this x1, but she needs to understand that the goal is to get her off of all narcotic medication, and use intervention to resolve her pain. Prescription is in my outbox.

## 2013-03-19 ENCOUNTER — Other Ambulatory Visit: Payer: Self-pay

## 2013-03-19 NOTE — Telephone Encounter (Signed)
Rx faxed to pharmacy. Barry Dienes, LPN

## 2013-03-30 ENCOUNTER — Encounter: Payer: Self-pay | Admitting: Sports Medicine

## 2013-03-30 ENCOUNTER — Ambulatory Visit (INDEPENDENT_AMBULATORY_CARE_PROVIDER_SITE_OTHER): Payer: BC Managed Care – PPO | Admitting: Sports Medicine

## 2013-03-30 VITALS — BP 120/82 | HR 90 | Wt 196.0 lb

## 2013-03-30 DIAGNOSIS — M545 Low back pain, unspecified: Secondary | ICD-10-CM

## 2013-03-30 NOTE — Assessment & Plan Note (Signed)
Status post right-sided L4-L5 interlaminar epidural steroid, as well as a right-sided L4-L5 transforaminal epidural steroid injection. Overall 85-90% improved from day one. Now has some pain on the left side but at the same level. Pain is predominately discogenic, and I cannot reproduce any facetogenic pain. At this point we will pursue a left-sided L4-L5 transforaminal epidural steroid injection. Like to see her back about a week afterwards to see how things went.

## 2013-03-30 NOTE — Progress Notes (Signed)
  Subjective:    CC: Followup  HPI: Lumbar radiculitis: Loretta Bullock has degenerative disc disease at the L3-L4 as well as the L4-L5 levels, she also has bilateral L4/L5 facet spondylosis. She has had 2 epidurals, the first one was in right-sided L4-L5 interlaminar, the second was a right sided L4-L5 transforaminal. She's 85-90% improved from day one, and she feels as though the transforaminal injection gave her more improvement than the interlaminar. She desires repeat injection. Pain continues to be in the low back, currently on the left side now more than the right side, radicular symptoms are better. Pain is worse with sitting for long periods of time and flexion. Moderate.  Past medical history, Surgical history, Family history not pertinant except as noted below, Social history, Allergies, and medications have been entered into the medical record, reviewed, and no changes needed.   Review of Systems: No fevers, chills, night sweats, weight loss, chest pain, or shortness of breath.   Objective:    General: Well Developed, well nourished, and in no acute distress.  Neuro: Alert and oriented x3, extra-ocular muscles intact, sensation grossly intact.  HEENT: Normocephalic, atraumatic, pupils equal round reactive to light, neck supple, no masses, no lymphadenopathy, thyroid nonpalpable.  Skin: Warm and dry, no rashes. Cardiac: Regular rate and rhythm, no murmurs rubs or gallops, no lower extremity edema.  Respiratory: Clear to auscultation bilaterally. Not using accessory muscles, speaking in full sentences. Back Exam:  Inspection: Unremarkable  Motion: Flexion 45 deg, Extension 45 deg, Side Bending to 45 deg bilaterally,  Rotation to 45 deg bilaterally  SLR laying: Negative  XSLR laying: Negative  Palpable tenderness: None. FABER: negative. Sensory change: Gross sensation intact to all lumbar and sacral dermatomes.  Reflexes: 2+ at both patellar tendons, 2+ at achilles tendons, Babinski's  downgoing.  Strength at foot  Plantar-flexion: 5/5 Dorsi-flexion: 5/5 Eversion: 5/5 Inversion: 5/5  Leg strength  Quad: 5/5 Hamstring: 5/5 Hip flexor: 5/5 Hip abductors: 5/5  Gait unremarkable. Impression and Recommendations:

## 2013-04-05 ENCOUNTER — Ambulatory Visit
Admission: RE | Admit: 2013-04-05 | Discharge: 2013-04-05 | Disposition: A | Discharge status: Home / Self Care | Payer: BC Managed Care – PPO | Source: Ambulatory Visit | Attending: Sports Medicine | Admitting: Sports Medicine

## 2013-04-05 MED ORDER — METHYLPREDNISOLONE ACETATE 40 MG/ML INJ SUSP (RADIOLOG: 120.0000 mg | Freq: Once | INTRAMUSCULAR | Status: DC

## 2013-04-05 MED ORDER — IOHEXOL 180 MG/ML  SOLN
1.0000 mL | Freq: Once | INTRAMUSCULAR | Status: AC | PRN
  Administered 2013-04-05: 1 mL via EPIDURAL

## 2013-05-24 ENCOUNTER — Other Ambulatory Visit: Payer: Self-pay | Admitting: Family Medicine

## 2013-06-12 ENCOUNTER — Other Ambulatory Visit: Payer: Self-pay | Admitting: Family Medicine

## 2013-06-15 ENCOUNTER — Ambulatory Visit (INDEPENDENT_AMBULATORY_CARE_PROVIDER_SITE_OTHER): Payer: BC Managed Care – PPO | Admitting: Sports Medicine

## 2013-06-15 ENCOUNTER — Encounter: Payer: Self-pay | Admitting: Sports Medicine

## 2013-06-15 VITALS — BP 129/82 | HR 90 | Wt 200.0 lb

## 2013-06-15 DIAGNOSIS — M51379 Other intervertebral disc degeneration, lumbosacral region without mention of lumbar back pain or lower extremity pain: Secondary | ICD-10-CM

## 2013-06-15 DIAGNOSIS — M51369 Other intervertebral disc degeneration, lumbar region without mention of lumbar back pain or lower extremity pain: Secondary | ICD-10-CM

## 2013-06-15 DIAGNOSIS — M5136 Other intervertebral disc degeneration, lumbar region: Secondary | ICD-10-CM

## 2013-06-15 DIAGNOSIS — M5137 Other intervertebral disc degeneration, lumbosacral region: Secondary | ICD-10-CM

## 2013-06-15 MED ORDER — HYDROCODONE-ACETAMINOPHEN 5-325 MG PO TABS: 1.0000 | ORAL_TABLET | Freq: Three times a day (TID) | ORAL | Status: DC | PRN

## 2013-06-15 NOTE — Progress Notes (Signed)
  Subjective:    CC: Followup  HPI:  This is a very pleasant 52 year old female with multilevel lumbar degenerative disc disease. Her symptoms have predominantly been a right L5 type distribution radiculitis with occasional radiation down the left leg. She really did not have many symptoms of spinal stenosis or neurogenic claudication when I first saw her. She is already failed formal physical therapy, steroids, NSAIDs, muscle relaxers. More recently she has had epidurals, the first of which was at the L4-L5 level on the right, interlaminar. This provided an excellent response, subsequently we repeated transforaminal injections at the L4-L5 level bilaterally. Her second injection gave her a good response, after the third injection she is now having some recurrence of both axial and radicular pain. Her disc protrusions or L3-L4, L4-L5, and L5-S1, she also has facet spondylosis but no facetogenic symptoms clinically. Pain has worsened slightly. No bowel or bladder dysfunction, no saddle numbness, no constitutional symptoms.  Past medical history, Surgical history, Family history not pertinant except as noted below, Social history, Allergies, and medications have been entered into the medical record, reviewed, and no changes needed.   Review of Systems: No fevers, chills, night sweats, weight loss, chest pain, or shortness of breath.   Objective:    General: Well Developed, well nourished, and in no acute distress.  Neuro: Alert and oriented x3, extra-ocular muscles intact, sensation grossly intact.  HEENT: Normocephalic, atraumatic, pupils equal round reactive to light, neck supple, no masses, no lymphadenopathy, thyroid nonpalpable.  Skin: Warm and dry, no rashes. Cardiac: Regular rate and rhythm, no murmurs rubs or gallops, no lower extremity edema.  Respiratory: Clear to auscultation bilaterally. Not using accessory muscles, speaking in full sentences. Back Exam:  Inspection: Unremarkable    Motion: Flexion 45 deg, Extension 45 deg, Side Bending to 45 deg bilaterally,  Rotation to 45 deg bilaterally  SLR laying: Negative  XSLR laying: Negative  Palpable tenderness: None.  FABER: negative.  Sensory change: Gross sensation intact to all lumbar and sacral dermatomes.  Reflexes: 2+ at both patellar tendons, 2+ at achilles tendons, Babinski's downgoing.  Strength at foot  Plantar-flexion: 5/5 Dorsi-flexion: 5/5 Eversion: 5/5 Inversion: 5/5  Leg strength  Quad: 5/5 Hamstring: 5/5 Hip flexor: 5/5 Hip abductors: 5/5  Gait unremarkable.  MRI was reviewed personally, there is a large disc protrusion on the right side of the L3-L4 level, there is a moderate broad-based disc protrusion at the L4-L5 level causing bilateral foraminal stenosis as well as spinal stenosis, there is also a smaller broad-based disc protrusion at the L5-S1 level.  Impression and Recommendations:

## 2013-06-15 NOTE — Assessment & Plan Note (Signed)
There is multilevel degenerative disc disease L3-L4, L4-L5, and the L5-S1 levels as well as facet spondylosis. Anus been predominantly referable to the L4 nerve root bilaterally, she does not have much facet pain clinically. She had an excellent, 85-90% response to L4-L5 epidurals, 2 on the right with the first being interlaminar, the second transforaminal, and one on the left, transforaminal. Unfortunately pain has started to recur. She has failed physical therapy, oral steroids, NSAIDs, muscle relaxers. At this point I think she does become a surgical candidate. I'm going to send her to Dr. Milinda Cave per her request. Refilling hydrocodone, she can follow up with me on an as needed basis.

## 2013-06-20 HISTORY — PX: LUMBAR DISC SURGERY: SHX700

## 2013-07-03 DIAGNOSIS — M48061 Spinal stenosis, lumbar region without neurogenic claudication: Secondary | ICD-10-CM | POA: Diagnosis present

## 2013-09-07 ENCOUNTER — Telehealth: Payer: Self-pay | Admitting: *Deleted

## 2013-09-07 DIAGNOSIS — E785 Hyperlipidemia, unspecified: Secondary | ICD-10-CM

## 2013-09-07 DIAGNOSIS — I10 Essential (primary) hypertension: Secondary | ICD-10-CM

## 2013-09-07 DIAGNOSIS — E8881 Metabolic syndrome: Secondary | ICD-10-CM

## 2013-09-07 NOTE — Telephone Encounter (Signed)
Pt would like to have labs done prior to OV.Loretta Bullock Loretta Bullock  

## 2013-09-10 LAB — COMPLETE METABOLIC PANEL WITH GFR
ALT: 28 U/L (ref 0–35)
Alkaline Phosphatase: 27 U/L — ABNORMAL LOW (ref 39–117)
BUN: 15 mg/dL (ref 6–23)
CO2: 26 mEq/L (ref 19–32)
Calcium: 9.8 mg/dL (ref 8.4–10.5)
GFR, Est African American: 83 mL/min
GFR, Est Non African American: 72 mL/min
Glucose, Bld: 92 mg/dL (ref 70–99)
Sodium: 138 mEq/L (ref 135–145)
Total Bilirubin: 0.5 mg/dL (ref 0.3–1.2)
Total Protein: 7.1 g/dL (ref 6.0–8.3)

## 2013-09-10 LAB — LIPID PANEL
HDL: 40 mg/dL (ref >39)
LDL Cholesterol: 105 mg/dL — ABNORMAL HIGH (ref 0–99)
Total CHOL/HDL Ratio: 4.5 Ratio

## 2013-09-10 LAB — HEMOGLOBIN A1C: Mean Plasma Glucose: 117 mg/dL — ABNORMAL HIGH (ref <117)

## 2013-09-10 LAB — TSH: TSH: 5.675 u[IU]/mL — ABNORMAL HIGH (ref 0.350–4.500)

## 2013-09-18 ENCOUNTER — Ambulatory Visit (INDEPENDENT_AMBULATORY_CARE_PROVIDER_SITE_OTHER): Payer: BC Managed Care – PPO | Admitting: Family Medicine

## 2013-09-18 ENCOUNTER — Encounter: Payer: Self-pay | Admitting: Family Medicine

## 2013-09-18 VITALS — BP 131/81 | HR 97 | Temp 97.9°F | Wt 188.0 lb

## 2013-09-18 DIAGNOSIS — E785 Hyperlipidemia, unspecified: Secondary | ICD-10-CM

## 2013-09-18 DIAGNOSIS — R7989 Other specified abnormal findings of blood chemistry: Secondary | ICD-10-CM

## 2013-09-18 DIAGNOSIS — E8881 Metabolic syndrome: Secondary | ICD-10-CM

## 2013-09-18 DIAGNOSIS — I10 Essential (primary) hypertension: Secondary | ICD-10-CM

## 2013-09-18 DIAGNOSIS — R946 Abnormal results of thyroid function studies: Secondary | ICD-10-CM

## 2013-09-18 MED ORDER — VALSARTAN-HYDROCHLOROTHIAZIDE 160-25 MG PO TABS: ORAL_TABLET | ORAL | Status: DC

## 2013-09-18 MED ORDER — OMEGA-3-ACID ETHYL ESTERS 1 G PO CAPS: ORAL_CAPSULE | ORAL | Status: DC

## 2013-09-18 NOTE — Progress Notes (Signed)
Subjective:    Patient ID: Loretta Bullock, female    DOB: 1961/03/11, 52 y.o.   MRN: [id number]  HPI HTN -  Pt denies chest pain, SOB, dizziness, or heart palpitations.  Taking meds as directed w/o problems.  Denies medication side effects.    Had lumbar surgery in October for her lumbar spine.  Dong well overall but still having some inflammation fo the sciatic nerve.   IFG - here to review labs. No prior dx. she is actually been exercising more and has cut back on soda.  Thyroid  - here to review labs. No recent skin or hair changes. She has been doing aquatic therapy and has lost 13 lbs. No change in energy level.    Review of Systems  BP 131/81  Pulse 97  Temp(Src) 97.9 F (36.6 C)  Wt 188 lb (85.276 kg)    No Known Allergies  Past Medical History  Diagnosis Date  . Sleep apnea     CPAP 7 cm water    Past Surgical History  Procedure Laterality Date  . Cesarean section  2002  . Lumbar disc surgery  06/2013    Dr. Fabio Asa    History   Social History  . Marital Status: Married    Spouse Name: N/A    Number of Children: N/A  . Years of Education: N/A   Occupational History  . Not on file.   Social History Main Topics  . Smoking status: Former Games developer  . Smokeless tobacco: Not on file  . Alcohol Use: Yes  . Drug Use: No  . Sexual Activity:    Other Topics Concern  . Not on file   Social History Narrative  . No narrative on file    Family History  Problem Relation Age of Onset  . Hyperlipidemia Mother   . Hyperlipidemia Father   . Hyperlipidemia Brother   . Diabetes Maternal Grandmother     Outpatient Encounter Prescriptions as of 09/18/2013  Medication Sig  . Choline Fenofibrate (FENOFIBRIC ACID) 135 MG CPDR TAKE 1 CAPSULE (135 MG TOTAL) BY MOUTH DAILY.  . fexofenadine (ALLEGRA) 180 MG tablet Take 180 mg by mouth daily.  . fluticasone (FLONASE) 50 MCG/ACT nasal spray Place 2 sprays into the nose daily.  . furosemide (LASIX) 20 MG  tablet Take 1 tablet (20 mg total) by mouth daily as needed.  . Multiple Vitamin (MULTIVITAMIN) capsule Take 1 capsule by mouth daily.    Marland Kitchen omega-3 acid ethyl esters (LOVAZA) 1 G capsule TAKE 1 CAPSULE (1 G TOTAL) BY MOUTH 2 (TWO) TIMES DAILY.  . valsartan-hydrochlorothiazide (DIOVAN-HCT) 160-25 MG per tablet TAKE 1 TABLET BY MOUTH DAILY.  Marland Kitchen venlafaxine (EFFEXOR-XR) 75 MG 24 hr capsule Take 75 mg by mouth daily.    . [DISCONTINUED] LOVAZA 1 G capsule TAKE 1 CAPSULE (1 G TOTAL) BY MOUTH 2 (TWO) TIMES DAILY.  . [DISCONTINUED] valsartan-hydrochlorothiazide (DIOVAN-HCT) 160-25 MG per tablet TAKE 1 TABLET BY MOUTH DAILY.  . [DISCONTINUED] cyclobenzaprine (FLEXERIL) 10 MG tablet One half tab PO qHS, then increase gradually to one tab TID.  . [DISCONTINUED] HYDROcodone-acetaminophen (NORCO/VICODIN) 5-325 MG per tablet Take 1 tablet by mouth every 8 (eight) hours as needed for pain.  . [DISCONTINUED] meloxicam (MOBIC) 15 MG tablet One tab PO qAM with breakfast for 2 weeks, then daily prn pain.          Objective:   Physical Exam  Constitutional: She is oriented to person, place, and time. She appears well-developed and  well-nourished.  HENT:  Head: Normocephalic and atraumatic.  Cardiovascular: Normal rate, regular rhythm and normal heart sounds.   Pulmonary/Chest: Effort normal and breath sounds normal.  Neurological: She is alert and oriented to person, place, and time.  Skin: Skin is warm and dry.  Psychiatric: She has a normal mood and affect. Her behavior is normal.          Assessment & Plan:  HTN - well controlled.  F/U  In 6 mo. RF Diovan HCT.   IFG - Abnormal. Discussed diet and exercise.  Recheck in 6 months.  Continue to work on weight loss. She is on fantastic job so far.  Abnormal thyroid - Recheck in 1-2 weeks. Explained this can sometimes be a transient change. Will add free T3 and free T4 as well. If still abnormal then she may be diagnosed with  hypothyroidism.  Hyperlipidemia - reviewed labs.  RF fenofibrate.

## 2013-10-10 LAB — TSH: TSH: 4.002 u[IU]/mL (ref 0.350–4.500)

## 2013-10-10 LAB — T4, FREE: Free T4: 1.04 ng/dL (ref 0.80–1.80)

## 2013-10-10 LAB — T3, FREE: T3, Free: 3 pg/mL (ref 2.3–4.2)

## 2014-01-25 ENCOUNTER — Telehealth: Payer: Self-pay | Admitting: *Deleted

## 2014-01-25 ENCOUNTER — Encounter: Payer: Self-pay | Admitting: Sports Medicine

## 2014-01-25 ENCOUNTER — Ambulatory Visit (INDEPENDENT_AMBULATORY_CARE_PROVIDER_SITE_OTHER): Payer: BC Managed Care – PPO | Admitting: Sports Medicine

## 2014-01-25 VITALS — BP 152/96 | HR 107 | Ht 60.0 in | Wt 191.0 lb

## 2014-01-25 DIAGNOSIS — M51379 Other intervertebral disc degeneration, lumbosacral region without mention of lumbar back pain or lower extremity pain: Secondary | ICD-10-CM

## 2014-01-25 DIAGNOSIS — M5137 Other intervertebral disc degeneration, lumbosacral region: Secondary | ICD-10-CM

## 2014-01-25 DIAGNOSIS — M5136 Other intervertebral disc degeneration, lumbar region: Secondary | ICD-10-CM

## 2014-01-25 DIAGNOSIS — M51369 Other intervertebral disc degeneration, lumbar region without mention of lumbar back pain or lower extremity pain: Secondary | ICD-10-CM

## 2014-01-25 NOTE — Assessment & Plan Note (Addendum)
Loretta Bullock has multilevel disc perfusions at the L3-4, L4-5, and L5-S1 levels. She had a fantastic response to selective L4 nerve root blocks, more recently she had an L3-L4 microdiscectomy which improved her axial pain but unfortunately she continued to have radicular pain. Her radicular pain that was referable to the L5 nerve root and she describes it as different from the L4 nerve root pain that she had prior to her epidurals which is still resolved. At this point I do think that one of these 2 levels (L4 vs L5) are currently symptomatic, and I would like to evaluate further. Since she did have a recurrence of pain after her surgery we do need a new MRI with IV contrast to evaluate for reherniation versus postoperative scar. I would also like to get nerve conduction studies to further elucidate which nerve root is the most symptomatic and responsible for her radicular symptoms. At this point for symptom relief we are also going to proceed with a selective right-sided L5 nerve root block.

## 2014-01-25 NOTE — Telephone Encounter (Signed)
No PA required for MRI Lumbar spine w/o.  Oscar La, LPN

## 2014-01-25 NOTE — Progress Notes (Signed)
  Subjective:    CC: Followup  HPI: Lumbar spondylosis: Loretta Bullock is somewhat complicated, when I first saw her she had L4 mediated pain, she responded extremely well to a selective L4 nerve block. She has multilevel degenerative disc disease worse at the L3-L4 level at that time. She then saw her surgeon who performed an L3-L4 microdiscectomy which resolved a large part of her axial back pain. She never did have the L4 mediated pain that she experienced first. Unfortunately, after her surgery she is now developed pain running down the same leg, but causing numbness and tingling on the anterior aspect of the right lower leg. Moderate, persistent.  Past medical history, Surgical history, Family history not pertinant except as noted below, Social history, Allergies, and medications have been entered into the medical record, reviewed, and no changes needed.   Review of Systems: No fevers, chills, night sweats, weight loss, chest pain, or shortness of breath.   Objective:    General: Well Developed, well nourished, and in no acute distress.  Neuro: Alert and oriented x3, extra-ocular muscles intact, sensation grossly intact.  HEENT: Normocephalic, atraumatic, pupils equal round reactive to light, neck supple, no masses, no lymphadenopathy, thyroid nonpalpable.  Skin: Warm and dry, no rashes. Cardiac: Regular rate and rhythm, no murmurs rubs or gallops, no lower extremity edema.  Respiratory: Clear to auscultation bilaterally. Not using accessory muscles, speaking in full sentences. Back Exam:  Inspection: Unremarkable  Motion: Flexion 45 deg, Extension 45 deg, Side Bending to 45 deg bilaterally,  Rotation to 45 deg bilaterally  SLR laying: Negative  XSLR laying: Negative  Palpable tenderness: None. FABER: negative. Sensory change: Hypoesthetic to the right L5 distribution. Reflexes: 2+ at both patellar tendons, 2+ at achilles tendons, Babinski's downgoing.  Strength at foot  Plantar-flexion:  5/5 Dorsi-flexion: 5/5 Eversion: 5/5 Inversion: 5/5  Leg strength  Quad: 5/5 Hamstring: 5/5 Hip flexor: 5/5 Hip abductors: 5/5  Gait unremarkable.  Impression and Recommendations:

## 2014-01-28 ENCOUNTER — Ambulatory Visit: Payer: BC Managed Care – PPO | Admitting: Sports Medicine

## 2014-01-28 ENCOUNTER — Other Ambulatory Visit: Payer: Self-pay | Admitting: Family Medicine

## 2014-01-28 ENCOUNTER — Telehealth: Payer: Self-pay | Admitting: *Deleted

## 2014-01-28 DIAGNOSIS — I10 Essential (primary) hypertension: Secondary | ICD-10-CM

## 2014-01-28 NOTE — Telephone Encounter (Signed)
BUN and Creatnine ordered due to pt having contrast with scan and has HTN.  Oscar La, LPN

## 2014-01-29 LAB — CREATININE, SERUM: CREATININE: 1.03 mg/dL (ref 0.50–1.10)

## 2014-01-29 LAB — BUN: BUN: 17 mg/dL (ref 6–23)

## 2014-01-29 LAB — BUN+CREAT: BUN/Creatinine Ratio: 16.5 Ratio

## 2014-01-30 ENCOUNTER — Ambulatory Visit (INDEPENDENT_AMBULATORY_CARE_PROVIDER_SITE_OTHER): Payer: BC Managed Care – PPO

## 2014-01-30 DIAGNOSIS — R209 Unspecified disturbances of skin sensation: Secondary | ICD-10-CM

## 2014-01-30 DIAGNOSIS — M5137 Other intervertebral disc degeneration, lumbosacral region: Secondary | ICD-10-CM

## 2014-01-30 DIAGNOSIS — M51379 Other intervertebral disc degeneration, lumbosacral region without mention of lumbar back pain or lower extremity pain: Secondary | ICD-10-CM

## 2014-01-30 NOTE — Procedures (Signed)
     HISTORY:  Loretta Bullock is a 53 year old patient with a history of back pain and right L5 distribution pain down the leg, with occasional pain down the left leg as well. This patient is also being evaluated for a possible neuropathy.   NERVE CONDUCTION STUDIES:  Nerve conduction studies were performed on both lower extremities. The distal motor latencies and motor amplitudes for the peroneal and posterior tibial nerves were within normal limits. The nerve conduction velocities for these nerves were also normal. The H reflex latencies were normal. The sensory latencies for the sural nerves were within normal limits.   EMG STUDIES:  EMG evaluation was not performed.  IMPRESSION:  Nerve conduction studies done on both lower extremities were unremarkable. No evidence of a peripheral neuropathy is seen. If a lumbosacral radiculopathy is clinically suspected, we would recommend EMG evaluation of the affected extremities.  Jill Alexanders MD 01/30/2014 12:50 PM  [facility] [address] [place], [place] [address]  Phone [phone] Fax [phone]

## 2014-01-31 ENCOUNTER — Ambulatory Visit
Admission: RE | Admit: 2014-01-31 | Discharge: 2014-01-31 | Disposition: A | Discharge status: Home / Self Care | Payer: BC Managed Care – PPO | Source: Ambulatory Visit | Attending: Sports Medicine | Admitting: Sports Medicine

## 2014-01-31 MED ORDER — METHYLPREDNISOLONE ACETATE 40 MG/ML INJ SUSP (RADIOLOG
120.0000 mg | Freq: Once | INTRAMUSCULAR | Status: AC
  Administered 2014-01-31: 120 mg via EPIDURAL

## 2014-01-31 MED ORDER — IOHEXOL 180 MG/ML  SOLN
1.0000 mL | Freq: Once | INTRAMUSCULAR | Status: AC | PRN
  Administered 2014-01-31: 1 mL via EPIDURAL

## 2014-01-31 NOTE — Discharge Instructions (Signed)

## 2014-02-01 ENCOUNTER — Ambulatory Visit
Admission: RE | Admit: 2014-02-01 | Discharge: 2014-02-01 | Disposition: A | Discharge status: Home / Self Care | Payer: BC Managed Care – PPO | Source: Ambulatory Visit | Attending: Sports Medicine | Admitting: Sports Medicine

## 2014-02-01 MED ORDER — GADOBENATE DIMEGLUMINE 529 MG/ML IV SOLN: 18.0000 mL | Freq: Once | INTRAVENOUS | Status: AC | PRN

## 2014-02-07 ENCOUNTER — Encounter: Payer: Self-pay | Admitting: Sports Medicine

## 2014-02-07 ENCOUNTER — Ambulatory Visit (INDEPENDENT_AMBULATORY_CARE_PROVIDER_SITE_OTHER): Payer: BC Managed Care – PPO | Admitting: Sports Medicine

## 2014-02-07 VITALS — BP 143/95 | HR 86 | Ht 60.0 in | Wt 190.0 lb

## 2014-02-07 DIAGNOSIS — M5137 Other intervertebral disc degeneration, lumbosacral region: Secondary | ICD-10-CM

## 2014-02-07 DIAGNOSIS — M51369 Other intervertebral disc degeneration, lumbar region without mention of lumbar back pain or lower extremity pain: Secondary | ICD-10-CM

## 2014-02-07 DIAGNOSIS — M51379 Other intervertebral disc degeneration, lumbosacral region without mention of lumbar back pain or lower extremity pain: Secondary | ICD-10-CM

## 2014-02-07 DIAGNOSIS — M5136 Other intervertebral disc degeneration, lumbar region: Secondary | ICD-10-CM

## 2014-02-07 MED ORDER — TRAMADOL HCL 50 MG PO TABS: ORAL_TABLET | ORAL | Status: DC

## 2014-02-07 MED ORDER — GABAPENTIN 300 MG PO CAPS: ORAL_CAPSULE | ORAL | Status: DC

## 2014-02-07 NOTE — Progress Notes (Signed)
  Subjective:    CC: Follow up  HPI: Lumbar degenerative disc disease: I last saw Claiborne Billings before epidural, her symptoms were predominantly right-sided L5, we did a right-sided L5 selective nerve root block provided immediate, near-complete, temporary pain relief that lasted a few days, unfortunately her pain has come back in the same distribution. Her pain is predominantly radicular with only very little axial pain. She is post a right-sided L3-4, and L4-5 laminectomy and decompression. She does have multilevel spondylolisthesis. On further questioning it sounds as though she did discuss with her surgeon the possibility of a future three-level fusion.  Past medical history, Surgical history, Family history not pertinant except as noted below, Social history, Allergies, and medications have been entered into the medical record, reviewed, and no changes needed.   Review of Systems: No fevers, chills, night sweats, weight loss, chest pain, or shortness of breath.   Objective:    General: Well Developed, well nourished, and in no acute distress.  Neuro: Alert and oriented x3, extra-ocular muscles intact, sensation grossly intact.  HEENT: Normocephalic, atraumatic, pupils equal round reactive to light, neck supple, no masses, no lymphadenopathy, thyroid nonpalpable.  Skin: Warm and dry, no rashes. Cardiac: Regular rate and rhythm, no murmurs rubs or gallops, no lower extremity edema.  Respiratory: Clear to auscultation bilaterally. Not using accessory muscles, speaking in full sentences. Right Knee: Normal to inspection with no erythema or effusion or obvious bony abnormalities. Mild tenderness to palpation over the patellar tendon. ROM full in flexion and extension and lower leg rotation. Ligaments with solid consistent endpoints including ACL, PCL, LCL, MCL. Negative Mcmurray's, Apley's, and Thessalonian tests. Non painful patellar compression. Patellar glide without crepitus. Patellar and  quadriceps tendons unremarkable. Hamstring and quadriceps strength is normal.   Impression and Recommendations:

## 2014-02-07 NOTE — Assessment & Plan Note (Signed)
With multilevel degenerative disc disease, and multilevel spondylolisthesis and retrolisthesis, in the grand scheme of things unfortunately I do think she is going to need a three-level fusion. Her predominant symptoms or axial, right-sided, and L5. She did have complete relief of  radicular symptoms after his elective right-sided L5 nerve root block suggesting that her L5-S1 level is now the most symptomatically we'll. She has minimal axial pain, and significant facet arthritis, so we will avoid any further axial procedures for now. If axial back pain does worsen, she would be a candidate for blocks of the lower 3 facet joints. For now we are going to proceed with gabapentin tramadol, and a like to see her back in one month. Her neurosurgeon has also told her that she would likely need a three-level fusion at some point in the future.

## 2014-02-08 ENCOUNTER — Ambulatory Visit: Payer: BC Managed Care – PPO | Admitting: Sports Medicine

## 2014-02-19 ENCOUNTER — Other Ambulatory Visit: Payer: Self-pay | Admitting: Family Medicine

## 2014-03-07 ENCOUNTER — Encounter: Payer: Self-pay | Admitting: Sports Medicine

## 2014-03-07 ENCOUNTER — Ambulatory Visit (INDEPENDENT_AMBULATORY_CARE_PROVIDER_SITE_OTHER): Payer: BC Managed Care – PPO | Admitting: Sports Medicine

## 2014-03-07 VITALS — BP 115/76 | HR 94 | Ht 60.0 in | Wt 192.0 lb

## 2014-03-07 DIAGNOSIS — M51369 Other intervertebral disc degeneration, lumbar region without mention of lumbar back pain or lower extremity pain: Secondary | ICD-10-CM

## 2014-03-07 DIAGNOSIS — M5137 Other intervertebral disc degeneration, lumbosacral region: Secondary | ICD-10-CM

## 2014-03-07 DIAGNOSIS — M51379 Other intervertebral disc degeneration, lumbosacral region without mention of lumbar back pain or lower extremity pain: Secondary | ICD-10-CM

## 2014-03-07 DIAGNOSIS — M5136 Other intervertebral disc degeneration, lumbar region: Secondary | ICD-10-CM

## 2014-03-07 MED ORDER — GABAPENTIN 100 MG PO CAPS: 100.0000 mg | ORAL_CAPSULE | Freq: Three times a day (TID) | ORAL | Status: DC

## 2014-03-07 MED ORDER — TRAMADOL HCL 50 MG PO TABS: ORAL_TABLET | ORAL | Status: DC

## 2014-03-07 NOTE — Progress Notes (Signed)
  Subjective:    CC: Followup  HPI: Lumbar spondylosis: Excellent response to L5-S1 epidural with continued relief of her radicular symptoms. She continues to have axial pain, and MRI does show multilevel facet arthrosis worse at the L4-L5 level. We have not yet tried facet injections. Pain is moderate, persistent. She did try gabapentin but was too sedated at 300 mg.  Past medical history, Surgical history, Family history not pertinant except as noted below, Social history, Allergies, and medications have been entered into the medical record, reviewed, and no changes needed.   Review of Systems: No fevers, chills, night sweats, weight loss, chest pain, or shortness of breath.   Objective:    General: Well Developed, well nourished, and in no acute distress.  Neuro: Alert and oriented x3, extra-ocular muscles intact, sensation grossly intact.  HEENT: Normocephalic, atraumatic, pupils equal round reactive to light, neck supple, no masses, no lymphadenopathy, thyroid nonpalpable.  Skin: Warm and dry, no rashes. Cardiac: Regular rate and rhythm, no murmurs rubs or gallops, no lower extremity edema.  Respiratory: Clear to auscultation bilaterally. Not using accessory muscles, speaking in full sentences.  Impression and Recommendations:

## 2014-03-07 NOTE — Assessment & Plan Note (Signed)
Multilevel degenerative disc disease and multilevel spondylolisthesis and retrolisthesis, again, and the grand scheme of things I do think she may proceed to a three-level fusion. Complete radicular pain relief after right-sided L5-S1 transforaminal epidural remains. She now has worsening of axial pain, with MRI worse at the L4-L5 facets bilaterally. At this point we are going to proceed with bilateral L3-L4, L4-L5, and L5-S1 facet injection. She is a good response, gets her we consider radio frequency ablation. 300 mg of gabapentin was too high, we are switching down to 100 mg. Return to see me a couple of weeks after the facet injections.

## 2014-03-11 ENCOUNTER — Other Ambulatory Visit: Payer: Self-pay | Admitting: Sports Medicine

## 2014-03-11 ENCOUNTER — Ambulatory Visit
Admission: RE | Admit: 2014-03-11 | Discharge: 2014-03-11 | Disposition: A | Discharge status: Home / Self Care | Payer: BC Managed Care – PPO | Source: Ambulatory Visit | Attending: Sports Medicine | Admitting: Sports Medicine

## 2014-03-11 DIAGNOSIS — M5136 Other intervertebral disc degeneration, lumbar region: Secondary | ICD-10-CM

## 2014-03-11 MED ORDER — IOHEXOL 180 MG/ML  SOLN
1.0000 mL | Freq: Once | INTRAMUSCULAR | Status: AC | PRN
  Administered 2014-03-11: 1 mL via INTRA_ARTICULAR

## 2014-03-26 ENCOUNTER — Ambulatory Visit: Payer: BC Managed Care – PPO | Admitting: Family Medicine

## 2014-03-26 ENCOUNTER — Ambulatory Visit: Payer: BC Managed Care – PPO | Admitting: Sports Medicine

## 2014-03-28 ENCOUNTER — Encounter: Payer: Self-pay | Admitting: Family Medicine

## 2014-03-28 ENCOUNTER — Ambulatory Visit (INDEPENDENT_AMBULATORY_CARE_PROVIDER_SITE_OTHER): Payer: BC Managed Care – PPO | Admitting: Family Medicine

## 2014-03-28 VITALS — BP 139/93 | HR 88 | Ht 60.0 in | Wt 190.0 lb

## 2014-03-28 DIAGNOSIS — I10 Essential (primary) hypertension: Secondary | ICD-10-CM

## 2014-03-28 DIAGNOSIS — R7989 Other specified abnormal findings of blood chemistry: Secondary | ICD-10-CM

## 2014-03-28 DIAGNOSIS — E8881 Metabolic syndrome: Secondary | ICD-10-CM

## 2014-03-28 LAB — POCT GLYCOSYLATED HEMOGLOBIN (HGB A1C): HEMOGLOBIN A1C: 5.5

## 2014-03-28 MED ORDER — VALSARTAN-HYDROCHLOROTHIAZIDE 320-25 MG PO TABS: 1.0000 | ORAL_TABLET | Freq: Every day | ORAL | Status: DC

## 2014-03-28 NOTE — Progress Notes (Signed)
Subjective:    Patient ID: Loretta Bullock, female    DOB: 05-Jun-1961, 53 y.o.   MRN: [id number]  HPI Hypertension- Pt denies chest pain, SOB, dizziness, or heart palpitations.  Taking meds as directed w/o problems.  Denies medication side effects.  BP was up recenlty when was taking medications.   IFG - No inc thirst or urination. Not on any medication for this.  Abnormal TSH-we'll recheck and come back into the normal range. I recommended rechecking in 3-4 months. She has a little bit more tired recently but thinks she just hasn't been sleeping as well. No other skin or hair changes.  Review of Systems  BP 139/93  Pulse 88  Ht 5' (1.524 m)  Wt 190 lb (86.183 kg)  BMI 37.11 kg/m2    No Known Allergies  Past Medical History  Diagnosis Date  . Sleep apnea     CPAP 7 cm water    Past Surgical History  Procedure Laterality Date  . Cesarean section  2002  . Lumbar disc surgery  06/2013    Dr. Elliot Dally    History   Social History  . Marital Status: Married    Spouse Name: N/A    Number of Children: N/A  . Years of Education: N/A   Occupational History  . Not on file.   Social History Main Topics  . Smoking status: Former Research scientist (life sciences)  . Smokeless tobacco: Not on file  . Alcohol Use: Yes  . Drug Use: No  . Sexual Activity:    Other Topics Concern  . Not on file   Social History Narrative  . No narrative on file    Family History  Problem Relation Age of Onset  . Hyperlipidemia Mother   . Hyperlipidemia Father   . Hyperlipidemia Brother   . Diabetes Maternal Grandmother     Outpatient Encounter Prescriptions as of 03/28/2014  Medication Sig  . Choline Fenofibrate (FENOFIBRIC ACID) 135 MG CPDR TAKE 1 CAPSULE EVERY DAY  . fexofenadine (ALLEGRA) 180 MG tablet Take 180 mg by mouth daily.  Marland Kitchen gabapentin (NEURONTIN) 100 MG capsule Take 1 capsule (100 mg total) by mouth 3 (three) times daily.  . Multiple Vitamin (MULTIVITAMIN) capsule Take 1 capsule by mouth  daily.    Marland Kitchen omega-3 acid ethyl esters (LOVAZA) 1 G capsule TAKE 1 CAPSULE (1 G TOTAL) BY MOUTH 2 (TWO) TIMES DAILY.  . traMADol (ULTRAM) 50 MG tablet 1-2 tabs by mouth Q8 hours, maximum 6 tabs per day.  . venlafaxine (EFFEXOR-XR) 75 MG 24 hr capsule Take 75 mg by mouth daily.    . [DISCONTINUED] valsartan-hydrochlorothiazide (DIOVAN-HCT) 160-25 MG per tablet TAKE 1 TABLET BY MOUTH DAILY.  . valsartan-hydrochlorothiazide (DIOVAN-HCT) 320-25 MG per tablet Take 1 tablet by mouth daily.  . [DISCONTINUED] fluticasone (FLONASE) 50 MCG/ACT nasal spray Place 2 sprays into the nose daily.  . [DISCONTINUED] furosemide (LASIX) 20 MG tablet Take 1 tablet (20 mg total) by mouth daily as needed.          Objective:   Physical Exam  Constitutional: She is oriented to person, place, and time. She appears well-developed and well-nourished.  HENT:  Head: Normocephalic and atraumatic.  Neck: Neck supple. No thyromegaly present.  Cardiovascular: Normal rate, regular rhythm and normal heart sounds.   Pulmonary/Chest: Effort normal and breath sounds normal.  Lymphadenopathy:    She has no cervical adenopathy.  Neurological: She is alert and oriented to person, place, and time.  Skin: Skin is warm and  dry.  Psychiatric: She has a normal mood and affect. Her behavior is normal.          Assessment & Plan:  HTN- uncontrolled.  We will increase her Diovan. Follow up in one month to recheck blood pressure and make sure well-regulated on new regimen. We'll probably consider checking a BMP at that time.  IFG -  Really well controlled. Recheck in 6-12 months.  Lab Results  Component Value Date   HGBA1C 5.5 03/28/2014   Abnormal TSH-due to recheck. She wants to wait until her followup visit next month which is reasonable.  Discussed need for tdap. She declines today but says she will get her her followup next month.

## 2014-04-01 ENCOUNTER — Ambulatory Visit: Payer: BC Managed Care – PPO | Admitting: Family Medicine

## 2014-04-02 ENCOUNTER — Encounter: Payer: Self-pay | Admitting: Sports Medicine

## 2014-04-02 ENCOUNTER — Ambulatory Visit (INDEPENDENT_AMBULATORY_CARE_PROVIDER_SITE_OTHER): Payer: BC Managed Care – PPO | Admitting: Sports Medicine

## 2014-04-02 VITALS — BP 118/73 | HR 89 | Wt 189.0 lb

## 2014-04-02 DIAGNOSIS — M5136 Other intervertebral disc degeneration, lumbar region: Secondary | ICD-10-CM

## 2014-04-02 DIAGNOSIS — M5137 Other intervertebral disc degeneration, lumbosacral region: Secondary | ICD-10-CM

## 2014-04-02 DIAGNOSIS — M51379 Other intervertebral disc degeneration, lumbosacral region without mention of lumbar back pain or lower extremity pain: Secondary | ICD-10-CM

## 2014-04-02 DIAGNOSIS — M51369 Other intervertebral disc degeneration, lumbar region without mention of lumbar back pain or lower extremity pain: Secondary | ICD-10-CM

## 2014-04-02 MED ORDER — TRAMADOL HCL 50 MG PO TABS: ORAL_TABLET | ORAL | Status: DC

## 2014-04-02 NOTE — Assessment & Plan Note (Signed)
I there was complete radicular pain relief after a right-sided L5-S1 transforaminal epidural injection approximately 2 months ago, repeating this. She did have a fantastic response to multilevel L3-L4, L4-L5, and L5-S1 bilateral facet injections with near complete relief of her axial pain. Of these 6 facet injection she had concordant pain at the left L3-L4 level in the right L5-S1 level, she would be a candidate for radio frequency ablation at the left L3-L4 and right L5-S1 levels should she decide. Continue tramadol as needed, and gabapentin 100 mg at night. 300 mg was too high.

## 2014-04-02 NOTE — Progress Notes (Signed)
  Subjective:    CC: Followup  HPI: Raysa has lumbar spondylosis, her epidural provided excellent radicular pain relief, subsequently multilevel facet injections provided fantastic axial pain relief. She is actually doing very well and happy with current results.  Past medical history, Surgical history, Family history not pertinant except as noted below, Social history, Allergies, and medications have been entered into the medical record, reviewed, and no changes needed.   Review of Systems: No fevers, chills, night sweats, weight loss, chest pain, or shortness of breath.   Objective:    General: Well Developed, well nourished, and in no acute distress.  Neuro: Alert and oriented x3, extra-ocular muscles intact, sensation grossly intact.  HEENT: Normocephalic, atraumatic, pupils equal round reactive to light, neck supple, no masses, no lymphadenopathy, thyroid nonpalpable.  Skin: Warm and dry, no rashes. Cardiac: Regular rate and rhythm, no murmurs rubs or gallops, no lower extremity edema.  Respiratory: Clear to auscultation bilaterally. Not using accessory muscles, speaking in full sentences.  Impression and Recommendations:

## 2014-04-09 ENCOUNTER — Ambulatory Visit
Admission: RE | Admit: 2014-04-09 | Discharge: 2014-04-09 | Disposition: A | Discharge status: Home / Self Care | Payer: BC Managed Care – PPO | Source: Ambulatory Visit | Attending: Sports Medicine | Admitting: Sports Medicine

## 2014-04-09 MED ORDER — METHYLPREDNISOLONE ACETATE 40 MG/ML INJ SUSP (RADIOLOG
120.0000 mg | Freq: Once | INTRAMUSCULAR | Status: AC
  Administered 2014-04-09: 120 mg via EPIDURAL

## 2014-04-09 MED ORDER — IOHEXOL 180 MG/ML  SOLN
1.0000 mL | Freq: Once | INTRAMUSCULAR | Status: AC | PRN
  Administered 2014-04-09: 1 mL via EPIDURAL

## 2014-04-25 ENCOUNTER — Other Ambulatory Visit: Payer: Self-pay | Admitting: Family Medicine

## 2014-04-25 ENCOUNTER — Telehealth: Payer: Self-pay | Admitting: Family Medicine

## 2014-04-25 DIAGNOSIS — R7989 Other specified abnormal findings of blood chemistry: Secondary | ICD-10-CM

## 2014-04-25 NOTE — Telephone Encounter (Signed)
Pt informed.Loretta Bullock  

## 2014-04-25 NOTE — Telephone Encounter (Signed)
Ms. Loretta Bullock called. She has a dr's appt on 8/11 and would like lab Order for Thyroid check sent down ab prior to her doctor's visit.  Thank you.

## 2014-04-27 LAB — TSH: TSH: 6.036 u[IU]/mL — AB (ref 0.350–4.500)

## 2014-04-29 LAB — T4, FREE: Free T4: 1.09 ng/dL (ref 0.80–1.80)

## 2014-04-29 LAB — T3, FREE: T3, Free: 2.4 pg/mL (ref 2.3–4.2)

## 2014-04-30 ENCOUNTER — Ambulatory Visit: Payer: BC Managed Care – PPO | Admitting: Family Medicine

## 2014-04-30 ENCOUNTER — Other Ambulatory Visit: Payer: Self-pay | Admitting: *Deleted

## 2014-04-30 DIAGNOSIS — M5136 Other intervertebral disc degeneration, lumbar region: Secondary | ICD-10-CM

## 2014-04-30 MED ORDER — GABAPENTIN 100 MG PO CAPS: 100.0000 mg | ORAL_CAPSULE | Freq: Three times a day (TID) | ORAL | Status: DC

## 2014-04-30 NOTE — Telephone Encounter (Signed)
[place]. Margette Fast, CMA

## 2014-05-01 ENCOUNTER — Encounter: Payer: Self-pay | Admitting: Family Medicine

## 2014-05-01 DIAGNOSIS — E038 Other specified hypothyroidism: Secondary | ICD-10-CM | POA: Insufficient documentation

## 2014-05-01 DIAGNOSIS — E039 Hypothyroidism, unspecified: Secondary | ICD-10-CM | POA: Insufficient documentation

## 2014-05-02 ENCOUNTER — Other Ambulatory Visit: Payer: Self-pay | Admitting: Sports Medicine

## 2014-05-07 ENCOUNTER — Ambulatory Visit (INDEPENDENT_AMBULATORY_CARE_PROVIDER_SITE_OTHER): Payer: BC Managed Care – PPO | Admitting: Family Medicine

## 2014-05-07 ENCOUNTER — Encounter: Payer: Self-pay | Admitting: Family Medicine

## 2014-05-07 VITALS — BP 113/77 | HR 86 | Wt 191.0 lb

## 2014-05-07 DIAGNOSIS — M5137 Other intervertebral disc degeneration, lumbosacral region: Secondary | ICD-10-CM

## 2014-05-07 DIAGNOSIS — M5136 Other intervertebral disc degeneration, lumbar region: Secondary | ICD-10-CM

## 2014-05-07 DIAGNOSIS — G4730 Sleep apnea, unspecified: Secondary | ICD-10-CM

## 2014-05-07 DIAGNOSIS — Z23 Encounter for immunization: Secondary | ICD-10-CM

## 2014-05-07 DIAGNOSIS — I10 Essential (primary) hypertension: Secondary | ICD-10-CM

## 2014-05-07 MED ORDER — TRAMADOL HCL 50 MG PO TABS: ORAL_TABLET | ORAL | Status: DC

## 2014-05-07 MED ORDER — VALSARTAN-HYDROCHLOROTHIAZIDE 320-25 MG PO TABS: 1.0000 | ORAL_TABLET | Freq: Every day | ORAL | Status: DC

## 2014-05-07 NOTE — Assessment & Plan Note (Signed)
Well-controlled on current regimen. Never prescription sent to pharmacy. Followup in 6 months.

## 2014-05-07 NOTE — Progress Notes (Signed)
   Subjective:    Patient ID: Loretta Bullock, female    DOB: 03/04/1961, 53 y.o.   MRN: [id number]  Hypertension   Hypertension- Pt denies chest pain, SOB, dizziness, or heart palpitations.  Taking meds as directed w/o problems.  Denies medication side effects.  We increased diovan last OV.    Sleep apnea-she does need a new prescription for CPAP supplies. Unfortunately the place where she was getting them, [facility], went out of business. She thinks her last sleep study was in 2004.  Review of Systems     Objective:   Physical Exam  Constitutional: She is oriented to person, place, and time. She appears well-developed and well-nourished.  HENT:  Head: Normocephalic and atraumatic.  Cardiovascular: Normal rate, regular rhythm and normal heart sounds.   Pulmonary/Chest: Effort normal and breath sounds normal.  Neurological: She is alert and oriented to person, place, and time.  Skin: Skin is warm and dry.  Psychiatric: She has a normal mood and affect. Her behavior is normal.          Assessment & Plan:

## 2014-05-07 NOTE — Assessment & Plan Note (Signed)
Will try to find a copy of her last sleep study to we can order new supplies through new supplier.

## 2014-05-20 ENCOUNTER — Other Ambulatory Visit: Payer: Self-pay | Admitting: *Deleted

## 2014-05-20 MED ORDER — VALSARTAN-HYDROCHLOROTHIAZIDE 320-25 MG PO TABS: 1.0000 | ORAL_TABLET | Freq: Every day | ORAL | Status: DC

## 2014-05-27 ENCOUNTER — Other Ambulatory Visit: Payer: Self-pay | Admitting: Family Medicine

## 2014-05-29 ENCOUNTER — Telehealth: Payer: Self-pay | Admitting: Family Medicine

## 2014-05-29 ENCOUNTER — Other Ambulatory Visit: Payer: Self-pay | Admitting: Family Medicine

## 2014-05-29 DIAGNOSIS — Z9989 Dependence on other enabling machines and devices: Principal | ICD-10-CM

## 2014-05-29 DIAGNOSIS — G4733 Obstructive sleep apnea (adult) (pediatric): Secondary | ICD-10-CM

## 2014-05-29 MED ORDER — AMBULATORY NON FORMULARY MEDICATION: Status: AC

## 2014-05-29 NOTE — Telephone Encounter (Signed)
Called pt and let her know put in new order for CPAP supplies with [facility].  They should be contacting her soon.

## 2014-08-05 ENCOUNTER — Other Ambulatory Visit: Payer: Self-pay | Admitting: Family Medicine

## 2014-09-20 HISTORY — PX: MAXIMUM ACCESS (MAS)POSTERIOR LUMBAR INTERBODY FUSION (PLIF) 1 LEVEL: SHX6368

## 2014-09-25 ENCOUNTER — Other Ambulatory Visit: Payer: Self-pay | Admitting: Family Medicine

## 2014-10-31 ENCOUNTER — Other Ambulatory Visit: Payer: Self-pay | Admitting: Family Medicine

## 2014-10-31 DIAGNOSIS — E039 Hypothyroidism, unspecified: Secondary | ICD-10-CM

## 2014-10-31 DIAGNOSIS — Z114 Encounter for screening for human immunodeficiency virus [HIV]: Secondary | ICD-10-CM

## 2014-10-31 DIAGNOSIS — I10 Essential (primary) hypertension: Secondary | ICD-10-CM

## 2014-10-31 DIAGNOSIS — E8881 Metabolic syndrome: Secondary | ICD-10-CM

## 2014-10-31 DIAGNOSIS — E785 Hyperlipidemia, unspecified: Secondary | ICD-10-CM

## 2014-10-31 DIAGNOSIS — E038 Other specified hypothyroidism: Secondary | ICD-10-CM

## 2014-10-31 NOTE — Telephone Encounter (Signed)
Called pt and informed her that she will need a f/u appt. appt made for 2/15. Labs faxed.Loretta Bullock

## 2014-11-01 LAB — LIPID PANEL
CHOL/HDL RATIO: 3.3 ratio
CHOLESTEROL: 129 mg/dL (ref 0–200)
HDL: 39 mg/dL — ABNORMAL LOW (ref >39)
LDL Cholesterol: 59 mg/dL (ref 0–99)
Triglycerides: 155 mg/dL — ABNORMAL HIGH (ref <150)
VLDL: 31 mg/dL (ref 0–40)

## 2014-11-01 LAB — COMPLETE METABOLIC PANEL WITH GFR
ALK PHOS: 47 U/L (ref 39–117)
ALT: 13 U/L (ref 0–35)
AST: 16 U/L (ref 0–37)
Albumin: 4.1 g/dL (ref 3.5–5.2)
BILIRUBIN TOTAL: 0.4 mg/dL (ref 0.2–1.2)
BUN: 13 mg/dL (ref 6–23)
CO2: 29 mEq/L (ref 19–32)
Calcium: 9.4 mg/dL (ref 8.4–10.5)
Chloride: 102 mEq/L (ref 96–112)
Creat: 0.71 mg/dL (ref 0.50–1.10)
GFR, Est African American: >89 mL/min
GLUCOSE: 97 mg/dL (ref 70–99)
Potassium: 4.1 mEq/L (ref 3.5–5.3)
SODIUM: 140 meq/L (ref 135–145)
Total Protein: 6.6 g/dL (ref 6.0–8.3)

## 2014-11-01 LAB — TSH: TSH: 4.14 u[IU]/mL (ref 0.350–4.500)

## 2014-11-02 LAB — HEMOGLOBIN A1C
Hgb A1c MFr Bld: 5.4 % (ref <5.7)
Mean Plasma Glucose: 108 mg/dL (ref <117)

## 2014-11-04 ENCOUNTER — Ambulatory Visit: Payer: Self-pay | Admitting: Family Medicine

## 2014-11-07 ENCOUNTER — Encounter: Payer: Self-pay | Admitting: Family Medicine

## 2014-11-07 ENCOUNTER — Ambulatory Visit (INDEPENDENT_AMBULATORY_CARE_PROVIDER_SITE_OTHER): Payer: BLUE CROSS/BLUE SHIELD | Admitting: Family Medicine

## 2014-11-07 VITALS — BP 116/75 | HR 94 | Ht 60.0 in | Wt 184.0 lb

## 2014-11-07 DIAGNOSIS — I10 Essential (primary) hypertension: Secondary | ICD-10-CM

## 2014-11-07 DIAGNOSIS — E8881 Metabolic syndrome: Secondary | ICD-10-CM

## 2014-11-07 DIAGNOSIS — E785 Hyperlipidemia, unspecified: Secondary | ICD-10-CM

## 2014-11-07 DIAGNOSIS — R059 Cough, unspecified: Secondary | ICD-10-CM

## 2014-11-07 DIAGNOSIS — R05 Cough: Secondary | ICD-10-CM

## 2014-11-07 MED ORDER — OMEGA-3-ACID ETHYL ESTERS 1 G PO CAPS: 1.0000 g | ORAL_CAPSULE | Freq: Two times a day (BID) | ORAL | Status: DC

## 2014-11-07 NOTE — Progress Notes (Signed)
   Subjective:    Patient ID: Loretta Bullock, female    DOB: 01/06/1961, 54 y.o.   MRN: [id number]  HPI Hypertension- Pt denies chest pain, SOB, dizziness, or heart palpitations.  Taking meds as directed w/o problems.  Denies medication side effects.  Lost 5 lbs since last here.   Had surgery on her Back at the end of Jan.  Says developed productive Cough.  Hasn't been getting better but slowly.  No fever or chils.  No SOB. Using nasal saline wash in the shower.  Using some cough syrup.  No wheezing.  She has had more congestion and post nasal drip this week.   Hyperlpidemia - Taking fenofibrae and lovaza. Tolerating well.    Review of Systems     Objective:   Physical Exam  Constitutional: She is oriented to person, place, and time. She appears well-developed and well-nourished.  HENT:  Head: Normocephalic and atraumatic.  Right Ear: External ear normal.  Left Ear: External ear normal.  Nose: Nose normal.  Mouth/Throat: Oropharynx is clear and moist.  TMs and canals are clear.   Eyes: Conjunctivae and EOM are normal. Pupils are equal, round, and reactive to light.  Neck: Neck supple. No thyromegaly present.  Cardiovascular: Normal rate, regular rhythm and normal heart sounds.   Pulmonary/Chest: Effort normal and breath sounds normal. She has no wheezes.  Lymphadenopathy:    She has no cervical adenopathy.  Neurological: She is alert and oriented to person, place, and time.  Skin: Skin is warm and dry.  Psychiatric: She has a normal mood and affect.          Assessment & Plan:  HTN- well controlled. Continue current regimen. Labs are up-to-date. Follow up in 6 months.  Cough - likely postviral. Call if getting wrose.  Continue symptomatic care.   Hyperlipidemia - refills sent ot pharmacy. Labs much better this year.  Lab Results  Component Value Date   CHOL 129 10/31/2014   HDL 39* 10/31/2014   LDLCALC 59 10/31/2014   TRIG 155* 10/31/2014   CHOLHDL 3.3 10/31/2014

## 2015-04-30 ENCOUNTER — Other Ambulatory Visit: Payer: Self-pay | Admitting: Family Medicine

## 2015-06-10 ENCOUNTER — Ambulatory Visit (INDEPENDENT_AMBULATORY_CARE_PROVIDER_SITE_OTHER): Payer: BLUE CROSS/BLUE SHIELD | Admitting: Family Medicine

## 2015-06-10 ENCOUNTER — Encounter: Payer: Self-pay | Admitting: Family Medicine

## 2015-06-10 VITALS — BP 131/80 | HR 83 | Temp 98.2°F | Ht 60.0 in | Wt 197.0 lb

## 2015-06-10 DIAGNOSIS — E039 Hypothyroidism, unspecified: Secondary | ICD-10-CM

## 2015-06-10 DIAGNOSIS — Z23 Encounter for immunization: Secondary | ICD-10-CM

## 2015-06-10 DIAGNOSIS — I10 Essential (primary) hypertension: Secondary | ICD-10-CM | POA: Diagnosis not present

## 2015-06-10 DIAGNOSIS — Z0184 Encounter for antibody response examination: Secondary | ICD-10-CM

## 2015-06-10 DIAGNOSIS — E038 Other specified hypothyroidism: Secondary | ICD-10-CM

## 2015-06-10 LAB — TSH: TSH: 4.224 u[IU]/mL (ref 0.350–4.500)

## 2015-06-10 LAB — BASIC METABOLIC PANEL WITH GFR
BUN: 18 mg/dL (ref 7–25)
CO2: 29 mmol/L (ref 20–31)
Calcium: 10.2 mg/dL (ref 8.6–10.4)
Chloride: 99 mmol/L (ref 98–110)
Creat: 0.91 mg/dL (ref 0.50–1.05)
GFR, Est African American: 83 mL/min (ref >=60)
GFR, Est Non African American: 72 mL/min (ref >=60)
GLUCOSE: 91 mg/dL (ref 65–99)
POTASSIUM: 3.6 mmol/L (ref 3.5–5.3)
Sodium: 138 mmol/L (ref 135–146)

## 2015-06-10 MED ORDER — GABAPENTIN 100 MG PO CAPS: ORAL_CAPSULE | ORAL | Status: DC

## 2015-06-10 NOTE — Progress Notes (Signed)
   Subjective:    Patient ID: Loretta Bullock, female    DOB: May 19, 1961, 54 y.o.   MRN: [id number]  HPI Hypertension- Pt denies chest pain, SOB, dizziness, or heart palpitations.  Taking meds as directed w/o problems.  Denies medication side effects.    Needs immunizations - she is going back to finance classes.  Need proof immunity.    Review of Systems     Objective:   Physical Exam  Constitutional: She is oriented to person, place, and time. She appears well-developed and well-nourished.  HENT:  Head: Normocephalic and atraumatic.  Cardiovascular: Normal rate, regular rhythm and normal heart sounds.   Pulmonary/Chest: Effort normal and breath sounds normal.  Neurological: She is alert and oriented to person, place, and time.  Skin: Skin is warm and dry.  Psychiatric: She has a normal mood and affect. Her behavior is normal.          Assessment & Plan:  HTN - controlled. Continue current regimen. Follow-up in 6 months.  Reviewed her forms needed for school. We will get titers for measles, mumps, and rubella. She said she has not required to have polio based on her age. The she did have the immunization she just doesn't have proof of it. We did have a current tdap on file from last year.. She is not going into healthcare. She's presently taking finance classes.  Subclinical hyperthyroidism-due to recheck TSH levels.

## 2015-06-11 LAB — MEASLES/MUMPS/RUBELLA IMMUNITY
Mumps IgG: 254 AU/mL — ABNORMAL HIGH (ref <9.00)
Rubella: 32.2 Index — ABNORMAL HIGH (ref <0.90)
Rubeola IgG: 31.9 AU/mL — ABNORMAL HIGH (ref <25.00)

## 2015-07-27 ENCOUNTER — Other Ambulatory Visit: Payer: Self-pay | Admitting: Family Medicine

## 2015-07-28 ENCOUNTER — Other Ambulatory Visit: Payer: Self-pay | Admitting: Family Medicine

## 2015-10-01 ENCOUNTER — Other Ambulatory Visit: Payer: Self-pay | Admitting: Sports Medicine

## 2015-10-20 ENCOUNTER — Other Ambulatory Visit: Payer: Self-pay | Admitting: Family Medicine

## 2015-10-30 ENCOUNTER — Other Ambulatory Visit: Payer: Self-pay | Admitting: Family Medicine

## 2015-11-17 ENCOUNTER — Ambulatory Visit (INDEPENDENT_AMBULATORY_CARE_PROVIDER_SITE_OTHER): Payer: BLUE CROSS/BLUE SHIELD | Admitting: Family Medicine

## 2015-11-17 ENCOUNTER — Encounter: Payer: Self-pay | Admitting: Family Medicine

## 2015-11-17 VITALS — BP 154/84 | HR 98 | Temp 98.4°F | Wt 208.0 lb

## 2015-11-17 DIAGNOSIS — L739 Follicular disorder, unspecified: Secondary | ICD-10-CM | POA: Insufficient documentation

## 2015-11-17 MED ORDER — DOXYCYCLINE HYCLATE 100 MG PO TABS: 100.0000 mg | ORAL_TABLET | Freq: Two times a day (BID) | ORAL | Status: DC

## 2015-11-17 NOTE — Assessment & Plan Note (Signed)
Skin change consistent with folliculitis. Not yet an abscess. Trial of oral doxycycline. Return sooner if worsening. Doubtful for spider bite.

## 2015-11-17 NOTE — Progress Notes (Signed)
       Loretta Bullock is a 55 y.o. female who presents to [facility] today for concern spider bite of the angle of the left jaw. Symptoms present now for a few days. No fevers chills nausea vomiting or diarrhea. Patient does not remember spider actually biting her. She's tried some over-the-counter medicines which have not helped. She feels well otherwise. She denies any discharge.    Past Medical History  Diagnosis Date  . Sleep apnea     CPAP 7 cm water   Past Surgical History  Procedure Laterality Date  . Cesarean section  2002  . Lumbar disc surgery  06/2013    Dr. Elliot Dally   Social History  Substance Use Topics  . Smoking status: Former Research scientist (life sciences)  . Smokeless tobacco: Not on file  . Alcohol Use: Yes   family history includes Diabetes in her maternal grandmother; Hyperlipidemia in her brother, father, and mother.  ROS as above Medications: Current Outpatient Prescriptions  Medication Sig Dispense Refill  . AMBULATORY NON FORMULARY MEDICATION Medication Name: CPAP  set to 7 cm of water. Please fit for new facemask with tubing. Humidifier. Diagnosis moderate obstructive sleep apnea. Please see sleep study from 01/27/2005. 1 vial 0  . Choline Fenofibrate (FENOFIBRIC ACID) 135 MG CPDR TAKE 1 CAPSULE EVERY DAY 90 capsule 2  . estradiol (VIVELLE-DOT) 0.075 MG/24HR APPLY 1 PATCH TWICE WEEKLY  4  . fexofenadine (ALLEGRA) 180 MG tablet Take 180 mg by mouth daily.    Marland Kitchen gabapentin (NEURONTIN) 100 MG capsule TAKE 1 CAPSULE (100 MG TOTAL) BY MOUTH 3 (THREE) TIMES DAILY. 270 capsule 0  . Multiple Vitamin (MULTIVITAMIN) capsule Take 1 capsule by mouth daily.      Marland Kitchen omega-3 acid ethyl esters (LOVAZA) 1 g capsule TAKE 1 CAPSULE (1 G TOTAL) BY MOUTH 2 (TWO) TIMES DAILY. 180 capsule 2  . progesterone (PROMETRIUM) 200 MG capsule     . valsartan-hydrochlorothiazide (DIOVAN-HCT) 320-25 MG tablet  TAKE 1 TABLET BY MOUTH DAILY. 90 tablet 0  . venlafaxine (EFFEXOR-XR) 75 MG 24 hr capsule Take 75 mg by mouth daily.      Marland Kitchen doxycycline (VIBRA-TABS) 100 MG tablet Take 1 tablet (100 mg total) by mouth 2 (two) times daily. 14 tablet 0   No current facility-administered medications for this visit.   No Known Allergies   Exam:  BP 154/84 mmHg  Pulse 98  Temp(Src) 98.4 F (36.9 C) (Oral)  Wt 208 lb (94.348 kg) Gen: Well NAD HEENT: EOMI,  MMM Lungs: Normal work of breathing. CTABL Heart: RRR no MRG Abd: NABS, Soft. Nondistended, Nontender Exts: Brisk capillary refill, warm and well perfused.  Skin: Mild induration and erythema angle of the left jaw. No fluctuance or discharge. Area of induration is approximately dime size. There is a small left-sided anterior cervical lymph node measuring less than 1 cm present as well. The lymph node is nontender.  No results found for this or any previous visit (from the past 24 hour(s)). No results found.   Please see individual assessment and plan sections.

## 2015-11-17 NOTE — Patient Instructions (Signed)
Thank you for coming in today.  Folliculitis Folliculitis is redness, soreness, and swelling (inflammation) of the hair follicles. This condition can occur anywhere on the body. People with weakened immune systems, diabetes, or obesity have a greater risk of getting folliculitis. CAUSES  Bacterial infection. This is the most common cause.  Fungal infection.  Viral infection.  Contact with certain chemicals, especially oils and tars. Long-term folliculitis can result from bacteria that live in the nostrils. The bacteria may trigger multiple outbreaks of folliculitis over time. SYMPTOMS Folliculitis most commonly occurs on the scalp, thighs, legs, back, buttocks, and areas where hair is shaved frequently. An early sign of folliculitis is a small, white or yellow, pus-filled, itchy lesion (pustule). These lesions appear on a red, inflamed follicle. They are usually less than 0.2 inches (5 mm) wide. When there is an infection of the follicle that goes deeper, it becomes a boil or furuncle. A group of closely packed boils creates a larger lesion (carbuncle). Carbuncles tend to occur in hairy, sweaty areas of the body. DIAGNOSIS  Your caregiver can usually tell what is wrong by doing a physical exam. A sample may be taken from one of the lesions and tested in a lab. This can help determine what is causing your folliculitis. TREATMENT  Treatment may include:  Applying warm compresses to the affected areas.  Taking antibiotic medicines orally or applying them to the skin.  Draining the lesions if they contain a large amount of pus or fluid.  Laser hair removal for cases of long-lasting folliculitis. This helps to prevent regrowth of the hair. HOME CARE INSTRUCTIONS  Apply warm compresses to the affected areas as directed by your caregiver.  If antibiotics are prescribed, take them as directed. Finish them even if you start to feel better.  You may take over-the-counter medicines to relieve  itching.  Do not shave irritated skin.  Follow up with your caregiver as directed. SEEK IMMEDIATE MEDICAL CARE IF:   You have increasing redness, swelling, or pain in the affected area.  You have a fever. MAKE SURE YOU:  Understand these instructions.  Will watch your condition.  Will get help right away if you are not doing well or get worse.   This information is not intended to replace advice given to you by your health care provider. Make sure you discuss any questions you have with your health care provider.   Document Released: 11/15/2001 Document Revised: 09/27/2014 Document Reviewed: 12/07/2011 Elsevier Interactive Patient Education [employer].

## 2016-01-22 ENCOUNTER — Other Ambulatory Visit: Payer: Self-pay | Admitting: Family Medicine

## 2016-02-19 ENCOUNTER — Encounter: Payer: Self-pay | Admitting: Family Medicine

## 2016-03-18 ENCOUNTER — Other Ambulatory Visit: Payer: Self-pay | Admitting: *Deleted

## 2016-03-18 NOTE — Telephone Encounter (Signed)
error 

## 2016-04-01 ENCOUNTER — Encounter: Payer: Self-pay | Admitting: Family Medicine

## 2016-04-01 ENCOUNTER — Ambulatory Visit (INDEPENDENT_AMBULATORY_CARE_PROVIDER_SITE_OTHER): Payer: BLUE CROSS/BLUE SHIELD | Admitting: Family Medicine

## 2016-04-01 VITALS — BP 139/84 | HR 85 | Wt 208.0 lb

## 2016-04-01 DIAGNOSIS — E8881 Metabolic syndrome: Secondary | ICD-10-CM | POA: Diagnosis not present

## 2016-04-01 DIAGNOSIS — E039 Hypothyroidism, unspecified: Secondary | ICD-10-CM

## 2016-04-01 DIAGNOSIS — Z1159 Encounter for screening for other viral diseases: Secondary | ICD-10-CM | POA: Diagnosis not present

## 2016-04-01 DIAGNOSIS — I10 Essential (primary) hypertension: Secondary | ICD-10-CM | POA: Diagnosis not present

## 2016-04-01 DIAGNOSIS — G4733 Obstructive sleep apnea (adult) (pediatric): Secondary | ICD-10-CM

## 2016-04-01 DIAGNOSIS — E038 Other specified hypothyroidism: Secondary | ICD-10-CM | POA: Diagnosis not present

## 2016-04-01 DIAGNOSIS — Z114 Encounter for screening for human immunodeficiency virus [HIV]: Secondary | ICD-10-CM

## 2016-04-01 LAB — POCT GLYCOSYLATED HEMOGLOBIN (HGB A1C): Hemoglobin A1C: 5.7

## 2016-04-01 MED ORDER — OMEGA-3-ACID ETHYL ESTERS 1 G PO CAPS: ORAL_CAPSULE | ORAL | Status: DC

## 2016-04-01 MED ORDER — VALSARTAN-HYDROCHLOROTHIAZIDE 320-25 MG PO TABS: 1.0000 | ORAL_TABLET | Freq: Every day | ORAL | Status: DC

## 2016-04-01 NOTE — Progress Notes (Signed)
Subjective:    CC: HTN  HPI: Hypertension- Pt denies chest pain, SOB, dizziness, or heart palpitations.  Taking meds as directed w/o problems.  Denies medication side effects.  Has noticed some trace ankle swelling bilaterally the summer. No chest pain.  IFG - No inc thrist or urination.    Subclinical hypothyroidism-she is doing well. She has gained some weight since I last saw her but says she has not been as active. She was previously doing water aerobics every morning but since her job and going back to school it really disrupted her schedule so she has not been able to exercise. And she's been sitting for long periods with her schooling.  Sleep apnea-she is currently on using her CPAP regularly but does feel like it might need to be adjusted or turned up. She uses [employer] for her supplies.  Past medical history, Surgical history, Family history not pertinant except as noted below, Social history, Allergies, and medications have been entered into the medical record, reviewed, and corrections made.   Review of Systems: No fevers, chills, night sweats, weight loss, chest pain, or shortness of breath.   Objective:    General: Well Developed, well nourished, and in no acute distress.  Neuro: Alert and oriented x3, extra-ocular muscles intact, sensation grossly intact.  HEENT: Normocephalic, atraumatic  Skin: Warm and dry, no rashes. Cardiac: Regular rate and rhythm, no murmurs rubs or gallops, no lower extremity edema.  Respiratory: Clear to auscultation bilaterally. Not using accessory muscles, speaking in full sentences. Ext: trace ankle edema    Impression and Recommendations:   HTN - Well controlled, Though borderline today. Continue work on weight loss and exercise and diet.. Continue current regimen. Follow up in 6 months  IFG - A1C of 5.7 , This is up from previous. Continue work on diet exercise and weight loss.   Subclinical  Hypothyroidism - Due to recheck TSH.  Sleep  apnea-we'll send order to [facility] for a download off her CPAP machine to see if her pressure might need to be adjusted with her recent weight gain.

## 2016-04-02 MED ORDER — AMBULATORY NON FORMULARY MEDICATION: Status: DC

## 2016-04-07 LAB — HM MAMMOGRAPHY

## 2016-04-17 LAB — COMPLETE METABOLIC PANEL WITH GFR
ALT: 41 U/L — AB (ref 6–29)
AST: 35 U/L (ref 10–35)
Albumin: 4.7 g/dL (ref 3.6–5.1)
Alkaline Phosphatase: 27 U/L — ABNORMAL LOW (ref 33–130)
BUN: 16 mg/dL (ref 7–25)
CHLORIDE: 101 mmol/L (ref 98–110)
CO2: 27 mmol/L (ref 20–31)
CREATININE: 0.97 mg/dL (ref 0.50–1.05)
Calcium: 10.1 mg/dL (ref 8.6–10.4)
GFR, EST AFRICAN AMERICAN: 76 mL/min (ref >=60)
GFR, Est Non African American: 66 mL/min (ref >=60)
GLUCOSE: 105 mg/dL — AB (ref 65–99)
Potassium: 4.1 mmol/L (ref 3.5–5.3)
SODIUM: 137 mmol/L (ref 135–146)
Total Bilirubin: 0.6 mg/dL (ref 0.2–1.2)
Total Protein: 6.8 g/dL (ref 6.1–8.1)

## 2016-04-17 LAB — HIV ANTIBODY (ROUTINE TESTING W REFLEX): HIV: NONREACTIVE

## 2016-04-17 LAB — LIPID PANEL
Cholesterol: 174 mg/dL (ref 125–200)
HDL: 39 mg/dL — ABNORMAL LOW (ref >=46)
LDL CALC: 96 mg/dL (ref <130)
TRIGLYCERIDES: 197 mg/dL — AB (ref <150)
Total CHOL/HDL Ratio: 4.5 Ratio (ref <=5.0)
VLDL: 39 mg/dL — AB (ref <30)

## 2016-04-17 LAB — TSH: TSH: 4.14 m[IU]/L

## 2016-04-17 LAB — HEPATITIS C ANTIBODY: HCV Ab: NEGATIVE

## 2016-05-06 ENCOUNTER — Other Ambulatory Visit: Payer: Self-pay | Admitting: Family Medicine

## 2016-09-10 ENCOUNTER — Other Ambulatory Visit: Payer: Self-pay | Admitting: Family Medicine

## 2016-09-11 ENCOUNTER — Other Ambulatory Visit: Payer: Self-pay | Admitting: Family Medicine

## 2016-09-11 ENCOUNTER — Other Ambulatory Visit: Payer: Self-pay | Admitting: Sports Medicine

## 2016-10-04 ENCOUNTER — Ambulatory Visit (INDEPENDENT_AMBULATORY_CARE_PROVIDER_SITE_OTHER): Payer: BLUE CROSS/BLUE SHIELD | Admitting: Family Medicine

## 2016-10-04 ENCOUNTER — Encounter: Payer: Self-pay | Admitting: Family Medicine

## 2016-10-04 VITALS — BP 135/78 | HR 96 | Wt 214.0 lb

## 2016-10-04 DIAGNOSIS — E8881 Metabolic syndrome: Secondary | ICD-10-CM

## 2016-10-04 DIAGNOSIS — B9789 Other viral agents as the cause of diseases classified elsewhere: Secondary | ICD-10-CM

## 2016-10-04 DIAGNOSIS — J069 Acute upper respiratory infection, unspecified: Secondary | ICD-10-CM

## 2016-10-04 DIAGNOSIS — Z1211 Encounter for screening for malignant neoplasm of colon: Secondary | ICD-10-CM

## 2016-10-04 DIAGNOSIS — Z23 Encounter for immunization: Secondary | ICD-10-CM | POA: Diagnosis not present

## 2016-10-04 DIAGNOSIS — I10 Essential (primary) hypertension: Secondary | ICD-10-CM

## 2016-10-04 DIAGNOSIS — E88819 Insulin resistance, unspecified: Secondary | ICD-10-CM

## 2016-10-04 LAB — POCT GLYCOSYLATED HEMOGLOBIN (HGB A1C): HEMOGLOBIN A1C: 5.7

## 2016-10-04 MED ORDER — VALSARTAN-HYDROCHLOROTHIAZIDE 320-25 MG PO TABS: 1.0000 | ORAL_TABLET | Freq: Every day | ORAL | 1 refills | Status: DC

## 2016-10-04 NOTE — Progress Notes (Signed)
Subjective:    CC: 6 mo f/u BP and A1C  HPI: Hypertension- Pt denies chest pain, SOB, dizziness, or heart palpitations.  Taking meds as directed w/o problems.  Denies medication side effects.    IFG - no inc thirst or urination. She admits she has not been exercising regularly because she is working full-time and in school but says she no she needs to try to make more time.  She said she started losing her voice about a week ago but then started feeling bad with some nasal congestion and dry cough about 3 or 4 days ago. She hasn't felt great but no fevers chills or sweats.  Past medical history, Surgical history, Family history not pertinant except as noted below, Social history, Allergies, and medications have been entered into the medical record, reviewed, and corrections made.   Review of Systems: No fevers, chills, night sweats, weight loss, chest pain, or shortness of breath.   Objective:    General: Well Developed, well nourished, and in no acute distress.  Neuro: Alert and oriented x3, extra-ocular muscles intact, sensation grossly intact.  HEENT: Normocephalic, atraumatic, oropharynx clear, TMs and canals are clear bilaterally. No sig cervical LN.    Skin: Warm and dry, no rashes. Cardiac: Regular rate and rhythm, no murmurs rubs or gallops, no lower extremity edema.  Respiratory: Clear to auscultation bilaterally. Not using accessory muscles, speaking in full sentences.   Impression and Recommendations:   HTN - Well controlled. Continue current regimen. Follow-up in 6 months. Due for CMP and lipid panel.  IFG - Stable at 5.7. Continue to watch diet and encourage regular exercise.  Upper respiratory infection-likely viral. Call if not better in a few days or sooner if getting worse around the fever area,   Due for screening colonoscopy. We'll place referral today.  Flu vaccine given today.

## 2016-10-06 ENCOUNTER — Encounter: Payer: Self-pay | Admitting: Family Medicine

## 2016-10-15 ENCOUNTER — Encounter: Payer: Self-pay | Admitting: Family Medicine

## 2016-11-26 LAB — HM COLONOSCOPY

## 2016-11-30 ENCOUNTER — Encounter: Payer: Self-pay | Admitting: Family Medicine

## 2017-01-20 ENCOUNTER — Ambulatory Visit (INDEPENDENT_AMBULATORY_CARE_PROVIDER_SITE_OTHER): Payer: BLUE CROSS/BLUE SHIELD | Admitting: Sports Medicine

## 2017-01-20 ENCOUNTER — Encounter: Payer: Self-pay | Admitting: Sports Medicine

## 2017-01-20 DIAGNOSIS — M5136 Other intervertebral disc degeneration, lumbar region: Secondary | ICD-10-CM

## 2017-01-20 DIAGNOSIS — M51369 Other intervertebral disc degeneration, lumbar region without mention of lumbar back pain or lower extremity pain: Secondary | ICD-10-CM

## 2017-01-20 MED ORDER — PREDNISONE 50 MG PO TABS: ORAL_TABLET | ORAL | 0 refills | Status: DC

## 2017-01-20 MED ORDER — KETOROLAC TROMETHAMINE 30 MG/ML IJ SOLN
30.0000 mg | Freq: Once | INTRAMUSCULAR | Status: AC
  Administered 2017-01-20: 30 mg via INTRAMUSCULAR

## 2017-01-20 NOTE — Progress Notes (Signed)
  Subjective:    CC: right back and leg pain  HPI: Loretta Bullock is a 56 y.o. Female with a history of lumbar degenerative disc disease s/p L4-L5 disc fusion in 2016 (at [facility] in [place]) who presents with right back pain that radiates down the leg. The pain started on Sunday. Pt describes the pain as 10/10. She reports numbness radiating down from her back across the thigh and down the front of the leg, following an L3-L4 distribution. Sitting is the only position that seems to improve the pain, but sitting for more than 4 hours can exacerbate the pain as well. She has tried heating and cooling pads to no effect, and has been taking tylenol constantly since Sunday. She has had to take 2-3 oxycotin she had leftover from a previous surgery for breakthrough pain.    Past medical history:  Negative.  See flowsheet/record as well for more information.  Surgical history: Negative.  See flowsheet/record as well for more information.  Family history: Negative.  See flowsheet/record as well for more information.  Social history: Negative.  See flowsheet/record as well for more information.  Allergies, and medications have been entered into the medical record, reviewed, and no changes needed.   Review of Systems: No fevers, chills, night sweats, weight loss, chest pain, or shortness of breath.   Objective:    General: Well Developed, well nourished, and in no acute distress.  Neuro: Alert and oriented x3, extra-ocular muscles intact, sensation grossly intact.  HEENT: Normocephalic, atraumatic, pupils equal round reactive to light, neck supple, no masses, no lymphadenopathy, thyroid nonpalpable.  Skin: Warm and dry, no rashes. Cardiac: Regular rate and rhythm, no murmurs rubs or gallops, no lower extremity edema.  Respiratory: Clear to auscultation bilaterally. Not using accessory muscles, speaking in full sentences. MSK: TTP at right lumbar spine, pain radiates down L3-L4  distribution.  Hip and knee full ROM in all planes bilaterally.  Left hip and knee strength full in all planes.  Right strength 3/5 hip abduction, 5/5 hip adduction, 4/5 hip flexion, 5/5 hip extension, 5/5 knee flexion, 5/5 knee extension. Patellar and Achillles reflex 1+ on right, 2+ on left.   Impression and Recommendations:    Loretta Bullock is a 56 y.o. Female with a history of lumbar degenerative disc disease s/p L4-L5 posterior lumbar interbody fusion in 2016 who presents with L3-L4 radiculopathy.   Prescribed 5 days prednisone. Toradol 30 mg intramuscular injection placed. Blood work ordered so that we can get a new MRI with IV contrast to guide further intervention. Follow-up with MRI results.

## 2017-01-20 NOTE — Assessment & Plan Note (Signed)
Did well in the past with a right-sided L5-S1 transforaminal epidural, which relieved radicular pain and then bilateral L3-S1 facet joint injections relieved axial pain. In 2016 she had a L4-L5 posterior lumbar interbody fusion. Followed by [facility] in [place]. Unfortunately she's having a recurrence of radicular pain on the right that feels different from the pain she had before the surgery, it is more in an L4 versus an L3 distribution. I am going to give her 5 days of prednisone, Toradol 30 mg intramuscular and obtain new MRI with IV contrast, she will be getting her blood work done today. Return to see me to go over MRI results.

## 2017-01-21 LAB — COMPLETE METABOLIC PANEL WITH GFR
ALBUMIN: 5.1 g/dL (ref 3.6–5.1)
ALK PHOS: 33 U/L (ref 33–130)
ALT: 32 U/L — ABNORMAL HIGH (ref 6–29)
AST: 33 U/L (ref 10–35)
BUN: 14 mg/dL (ref 7–25)
CHLORIDE: 98 mmol/L (ref 98–110)
CO2: 28 mmol/L (ref 20–31)
Calcium: 10.8 mg/dL — ABNORMAL HIGH (ref 8.6–10.4)
Creat: 0.95 mg/dL (ref 0.50–1.05)
GFR, EST NON AFRICAN AMERICAN: 68 mL/min (ref >=60)
GFR, Est African American: 78 mL/min (ref >=60)
GLUCOSE: 149 mg/dL — AB (ref 65–99)
POTASSIUM: 4.2 mmol/L (ref 3.5–5.3)
SODIUM: 138 mmol/L (ref 135–146)
Total Bilirubin: 0.5 mg/dL (ref 0.2–1.2)
Total Protein: 8 g/dL (ref 6.1–8.1)

## 2017-01-21 LAB — LIPID PANEL
CHOL/HDL RATIO: 4.3 ratio (ref <5.0)
Cholesterol: 213 mg/dL — ABNORMAL HIGH (ref <200)
HDL: 49 mg/dL — AB (ref >50)
LDL Cholesterol: 138 mg/dL — ABNORMAL HIGH (ref <100)
Triglycerides: 132 mg/dL (ref <150)
VLDL: 26 mg/dL (ref <30)

## 2017-01-31 ENCOUNTER — Ambulatory Visit (INDEPENDENT_AMBULATORY_CARE_PROVIDER_SITE_OTHER): Payer: BLUE CROSS/BLUE SHIELD

## 2017-01-31 DIAGNOSIS — M4326 Fusion of spine, lumbar region: Secondary | ICD-10-CM

## 2017-01-31 DIAGNOSIS — M5116 Intervertebral disc disorders with radiculopathy, lumbar region: Secondary | ICD-10-CM

## 2017-01-31 DIAGNOSIS — M5117 Intervertebral disc disorders with radiculopathy, lumbosacral region: Secondary | ICD-10-CM | POA: Diagnosis not present

## 2017-01-31 MED ORDER — GADOBENATE DIMEGLUMINE 529 MG/ML IV SOLN
20.0000 mL | Freq: Once | INTRAVENOUS | Status: AC | PRN
  Administered 2017-01-31: 20 mL via INTRAVENOUS

## 2017-02-01 ENCOUNTER — Telehealth: Payer: Self-pay | Admitting: Emergency Medicine

## 2017-02-01 DIAGNOSIS — M5416 Radiculopathy, lumbar region: Secondary | ICD-10-CM

## 2017-02-01 NOTE — Telephone Encounter (Signed)
Patient would like to have epidural pain administration appt. made with [facility]; states she has had this treatment in past and it was very helpful. pk

## 2017-02-01 NOTE — Telephone Encounter (Signed)
Right L3 and right L4 nerve roots appear to potentially be affected. I'm going to order a right-sided L3-L4 and a right-sided L4-L5 transforaminal epidural.

## 2017-02-07 ENCOUNTER — Ambulatory Visit
Admission: RE | Admit: 2017-02-07 | Discharge: 2017-02-07 | Disposition: A | Discharge status: Home / Self Care | Payer: BLUE CROSS/BLUE SHIELD | Source: Ambulatory Visit | Attending: Sports Medicine | Admitting: Sports Medicine

## 2017-02-07 ENCOUNTER — Other Ambulatory Visit: Payer: Self-pay | Admitting: Sports Medicine

## 2017-02-07 DIAGNOSIS — M5416 Radiculopathy, lumbar region: Secondary | ICD-10-CM

## 2017-02-07 MED ORDER — METHYLPREDNISOLONE ACETATE 40 MG/ML INJ SUSP (RADIOLOG
120.0000 mg | Freq: Once | INTRAMUSCULAR | Status: AC
  Administered 2017-02-07: 120 mg via EPIDURAL

## 2017-02-07 MED ORDER — IOPAMIDOL (ISOVUE-M 200) INJECTION 41%
1.0000 mL | Freq: Once | INTRAMUSCULAR | Status: AC
  Administered 2017-02-07: 1 mL via EPIDURAL

## 2017-02-07 NOTE — Discharge Instructions (Signed)

## 2017-02-08 ENCOUNTER — Telehealth: Payer: Self-pay | Admitting: Sports Medicine

## 2017-02-08 MED ORDER — DICLOFENAC SODIUM 75 MG PO TBEC: 75.0000 mg | DELAYED_RELEASE_TABLET | Freq: Two times a day (BID) | ORAL | 3 refills | Status: DC

## 2017-02-08 NOTE — Telephone Encounter (Signed)
Pt called clinic requesting an Rx for pain. Routing for review.

## 2017-02-08 NOTE — Telephone Encounter (Signed)
Pt advised,verbalized understanding. 

## 2017-02-08 NOTE — Telephone Encounter (Signed)
Voltaren twice a day.  If this doesn't work we can use your favorite, Tramadol. :-P

## 2017-02-15 ENCOUNTER — Telehealth: Payer: Self-pay

## 2017-02-15 MED ORDER — TRAMADOL HCL 50 MG PO TABS: ORAL_TABLET | ORAL | 0 refills | Status: DC

## 2017-02-15 NOTE — Telephone Encounter (Signed)
Rx in box. Have the epidurals provided any relief? Follow-up with me should be one month after the epidural

## 2017-02-15 NOTE — Telephone Encounter (Signed)
Pt stated that she needs a script for tramadol, other medication is not working.  She also wants to know if she needs a follow up with you.  Please advise.

## 2017-02-15 NOTE — Telephone Encounter (Signed)
I will fax script. The epidural has provided some relief, but she said it has been a slow process.  She said her right knee is still bothering her. She will follow up.

## 2017-03-04 ENCOUNTER — Ambulatory Visit (INDEPENDENT_AMBULATORY_CARE_PROVIDER_SITE_OTHER): Payer: BLUE CROSS/BLUE SHIELD

## 2017-03-04 ENCOUNTER — Ambulatory Visit (INDEPENDENT_AMBULATORY_CARE_PROVIDER_SITE_OTHER): Payer: BLUE CROSS/BLUE SHIELD | Admitting: Sports Medicine

## 2017-03-04 ENCOUNTER — Encounter: Payer: Self-pay | Admitting: Sports Medicine

## 2017-03-04 ENCOUNTER — Other Ambulatory Visit: Payer: Self-pay | Admitting: Sports Medicine

## 2017-03-04 DIAGNOSIS — M1711 Unilateral primary osteoarthritis, right knee: Secondary | ICD-10-CM

## 2017-03-04 DIAGNOSIS — M25561 Pain in right knee: Secondary | ICD-10-CM | POA: Diagnosis not present

## 2017-03-04 DIAGNOSIS — M5136 Other intervertebral disc degeneration, lumbar region: Secondary | ICD-10-CM

## 2017-03-04 DIAGNOSIS — M51369 Other intervertebral disc degeneration, lumbar region without mention of lumbar back pain or lower extremity pain: Secondary | ICD-10-CM

## 2017-03-04 MED ORDER — GABAPENTIN 100 MG PO CAPS: ORAL_CAPSULE | ORAL | 0 refills | Status: DC

## 2017-03-04 NOTE — Progress Notes (Signed)
   Subjective:    I'm seeing this patient as a consultation for:  Dr. Beatrice Lecher  CC: Right knee pain  HPI: For months this pleasant 56 year old female who I'm treating for lumbar radiculopathy has had pain that she localizes in the right knee, moderate, persistent, mostly at the medial joint line and under the kneecap with gelling, no mechanical symptoms, no trauma. Localized without radiation.  Lumbar degenerative disc disease: She is post posterior lumbar interbody fusion, had right-sided L3 and L4 distribution radicular pain, an MRI showed potential contact with the disc of the L3 and L4 nerve roots so we proceeded with a right L3-L4 transforaminal and a right L4-L5 transforaminal epidural which provided over 75% pain relief, at this point she is going to let me know later if she would like to proceed with the second epidural.  Past medical history:  Negative.  See flowsheet/record as well for more information.  Surgical history: Negative.  See flowsheet/record as well for more information.  Family history: Negative.  See flowsheet/record as well for more information.  Social history: Negative.  See flowsheet/record as well for more information.  Allergies, and medications have been entered into the medical record, reviewed, and no changes needed.   Review of Systems: No headache, visual changes, nausea, vomiting, diarrhea, constipation, dizziness, abdominal pain, skin rash, fevers, chills, night sweats, weight loss, swollen lymph nodes, body aches, joint swelling, muscle aches, chest pain, shortness of breath, mood changes, visual or auditory hallucinations.   Objective:   General: Well Developed, well nourished, and in no acute distress.  Neuro/Psych: Alert and oriented x3, extra-ocular muscles intact, able to move all 4 extremities, sensation grossly intact. Skin: Warm and dry, no rashes noted.  Respiratory: Not using accessory muscles, speaking in full sentences, trachea  midline.  Cardiovascular: Pulses palpable, no extremity edema. Abdomen: Does not appear distended. Right Knee: Minimally swollen, tender to palpation at the medial joint line ROM normal in flexion and extension and lower leg rotation. Ligaments with solid consistent endpoints including ACL, PCL, LCL, MCL. Negative Mcmurray's and provocative meniscal tests. Non painful patellar compression. Patellar and quadriceps tendons unremarkable. Hamstring and quadriceps strength is normal.  Procedure: Real-time Ultrasound Guided Injection of right knee Device: GE Logiq E  Verbal informed consent obtained.  Time-out conducted.  Noted no overlying erythema, induration, or other signs of local infection.  Skin prepped in a sterile fashion.  Local anesthesia: Topical Ethyl chloride.  With sterile technique and under real time ultrasound guidance:  1 mL Kenalog 40, 2 mL lidocaine, 2 mL bupivacaine injected easily Completed without difficulty  Pain immediately resolved suggesting accurate placement of the medication.  Advised to call if fevers/chills, erythema, induration, drainage, or persistent bleeding.  Images permanently stored and available for review in the ultrasound unit.  Impression: Technically successful ultrasound guided injection.  Impression and Recommendations:   This case required medical decision making of moderate complexity.  Lumbar degenerative disc disease 2016 had an L4-L5 posterior lumbar interbody fusion. Overall she did well but was having a recurrence of pain in both the right L3 and L4 nerve root distribution. We proceeded with right-sided selective L3 and selective L4 transforaminal epidural's, she reports 75% improvement in her radicular and back pain. She is going to call me and let me know if she would like to proceed with epidural #2 (right L3-L4 and L4-L5 transforaminals).  Primary osteoarthritis of right knee Injection as above, baseline x-rays. Return in one  month.

## 2017-03-04 NOTE — Assessment & Plan Note (Signed)
2016 had an L4-L5 posterior lumbar interbody fusion. Overall she did well but was having a recurrence of pain in both the right L3 and L4 nerve root distribution. We proceeded with right-sided selective L3 and selective L4 transforaminal epidural's, she reports 75% improvement in her radicular and back pain. She is going to call me and let me know if she would like to proceed with epidural #2 (right L3-L4 and L4-L5 transforaminals).

## 2017-03-04 NOTE — Assessment & Plan Note (Signed)
Injection as above, baseline x-rays. Return in one month.

## 2017-03-16 ENCOUNTER — Telehealth: Payer: Self-pay

## 2017-03-16 DIAGNOSIS — M51369 Other intervertebral disc degeneration, lumbar region without mention of lumbar back pain or lower extremity pain: Secondary | ICD-10-CM

## 2017-03-16 DIAGNOSIS — M5136 Other intervertebral disc degeneration, lumbar region: Secondary | ICD-10-CM

## 2017-03-16 NOTE — Telephone Encounter (Signed)
Called [facility] for scheduling -EH/RMA

## 2017-03-16 NOTE — Telephone Encounter (Signed)
Epidurals can typically be done as a series of 3 in 6 months, she's had 2 so I am going to order her third, please contact [facility] for scheduling.

## 2017-03-16 NOTE — Telephone Encounter (Signed)
Pt is wanting another epidural injection. Is this request appropriate? -EH/RMA

## 2017-03-28 ENCOUNTER — Telehealth: Payer: Self-pay | Admitting: Family Medicine

## 2017-03-28 NOTE — Telephone Encounter (Signed)
Pt stated that she is taking 1/2 -1 per day.Loretta Bullock

## 2017-03-28 NOTE — Telephone Encounter (Signed)
Before I refill would you find out approximately how many tabs she is taking per day?

## 2017-03-28 NOTE — Telephone Encounter (Signed)
Pt called. She is requesting refill on her Tramadol.  Thank you/

## 2017-03-29 MED ORDER — TRAMADOL HCL 50 MG PO TABS: 50.0000 mg | ORAL_TABLET | Freq: Every day | ORAL | 0 refills | Status: DC | PRN

## 2017-03-29 NOTE — Telephone Encounter (Signed)
Rx has [facility] to [facility]. Rhonda Cunningham,CMA  

## 2017-03-29 NOTE — Telephone Encounter (Signed)
Adding # 90, this should last her 3 months.

## 2017-03-30 ENCOUNTER — Ambulatory Visit
Admission: RE | Admit: 2017-03-30 | Discharge: 2017-03-30 | Disposition: A | Discharge status: Home / Self Care | Payer: BLUE CROSS/BLUE SHIELD | Source: Ambulatory Visit | Attending: Sports Medicine | Admitting: Sports Medicine

## 2017-03-30 ENCOUNTER — Other Ambulatory Visit: Payer: Self-pay | Admitting: Sports Medicine

## 2017-03-30 DIAGNOSIS — M5136 Other intervertebral disc degeneration, lumbar region: Secondary | ICD-10-CM

## 2017-03-30 MED ORDER — IOPAMIDOL (ISOVUE-M 200) INJECTION 41%
1.0000 mL | Freq: Once | INTRAMUSCULAR | Status: AC
  Administered 2017-03-30: 1 mL via EPIDURAL

## 2017-03-30 MED ORDER — METHYLPREDNISOLONE ACETATE 40 MG/ML INJ SUSP (RADIOLOG
120.0000 mg | Freq: Once | INTRAMUSCULAR | Status: AC
  Administered 2017-03-30: 120 mg via EPIDURAL

## 2017-04-04 ENCOUNTER — Ambulatory Visit: Payer: BLUE CROSS/BLUE SHIELD | Admitting: Family Medicine

## 2017-04-04 ENCOUNTER — Other Ambulatory Visit: Payer: Self-pay | Admitting: Family Medicine

## 2017-04-04 DIAGNOSIS — I10 Essential (primary) hypertension: Secondary | ICD-10-CM

## 2017-04-11 ENCOUNTER — Ambulatory Visit (INDEPENDENT_AMBULATORY_CARE_PROVIDER_SITE_OTHER): Payer: BLUE CROSS/BLUE SHIELD | Admitting: Family Medicine

## 2017-04-11 ENCOUNTER — Encounter: Payer: Self-pay | Admitting: Family Medicine

## 2017-04-11 VITALS — BP 138/77 | HR 91 | Ht 60.0 in | Wt 210.0 lb

## 2017-04-11 DIAGNOSIS — R7301 Impaired fasting glucose: Secondary | ICD-10-CM

## 2017-04-11 DIAGNOSIS — I10 Essential (primary) hypertension: Secondary | ICD-10-CM | POA: Diagnosis not present

## 2017-04-11 LAB — POCT GLYCOSYLATED HEMOGLOBIN (HGB A1C): Hemoglobin A1C: 6.1

## 2017-04-11 NOTE — Progress Notes (Signed)
Subjective:    CC: HTN  HPI: Hypertension- Pt denies chest pain, SOB, dizziness, or heart palpitations.  Taking meds as directed w/o problems.  Denies medication side effects.    Impaired fasting glucose-no increased thirst or urination. No symptoms consistent with hypoglycemia.  Elevated calcium level-we'll check her labs in May and her calcium level was just slightly elevated. She said she had been taking Tums little bit more frequently and we have encouraged her to back off a little.   Past medical history, Surgical history, Family history not pertinant except as noted below, Social history, Allergies, and medications have been entered into the medical record, reviewed, and corrections made.   Review of Systems: No fevers, chills, night sweats, weight loss, chest pain, or shortness of breath.   Objective:    General: Well Developed, well nourished, and in no acute distress.  Neuro: Alert and oriented x3, extra-ocular muscles intact, sensation grossly intact.  HEENT: Normocephalic, atraumatic  Skin: Warm and dry, no rashes. Cardiac: Regular rate and rhythm, no murmurs rubs or gallops, no lower extremity edema.  Respiratory: Clear to auscultation bilaterally. Not using accessory muscles, speaking in full sentences.   Impression and Recommendations:   HTN - Well controlled. Continue current regimen. Follow up in  6 months.   IFG - A1C is up to 6.1. Work on diet and exercise and f/u in 6 months.    Elevated calcium level-due to recheck.

## 2017-05-12 ENCOUNTER — Other Ambulatory Visit: Payer: Self-pay | Admitting: Family Medicine

## 2017-06-02 ENCOUNTER — Telehealth: Payer: Self-pay

## 2017-06-02 NOTE — Telephone Encounter (Signed)
Pt left VM asking for a refill of Tramadol because she will not have enough to make it to her rescheduled appointment on Tuesday. Called pt and she stated she has had to take more than what's on the bottle because of pain. Informed her of the rx note stating that the 90 tablets is supposed to be a 90 day supply and to get a refill she would have to see a provider to have dose changed but a refill would not be permitted at this time. Pt verbalized understanding.

## 2017-06-03 ENCOUNTER — Ambulatory Visit: Payer: BLUE CROSS/BLUE SHIELD | Admitting: Sports Medicine

## 2017-06-07 ENCOUNTER — Ambulatory Visit (INDEPENDENT_AMBULATORY_CARE_PROVIDER_SITE_OTHER): Payer: BLUE CROSS/BLUE SHIELD | Admitting: Sports Medicine

## 2017-06-07 DIAGNOSIS — M5136 Other intervertebral disc degeneration, lumbar region: Secondary | ICD-10-CM

## 2017-06-07 DIAGNOSIS — M51369 Other intervertebral disc degeneration, lumbar region without mention of lumbar back pain or lower extremity pain: Secondary | ICD-10-CM

## 2017-06-07 MED ORDER — TRAMADOL HCL 50 MG PO TABS: 50.0000 mg | ORAL_TABLET | Freq: Three times a day (TID) | ORAL | 0 refills | Status: DC | PRN

## 2017-06-07 NOTE — Progress Notes (Signed)
  Subjective:    CC: Follow-up  HPI: Loretta Bullock is a pleasant 56 year old female, she has significant lumbar spondylosis, she had an L4 laminectomy in 2014 and then L4-L5 posterior lumbar interbody fusion in 2016. Initially she did well but then had recurrence of pain back to baseline after about 2 years. As pain was in the right L3 and L4 nerve root distribution we did proceed with right sided transforaminal L3 as well as transforaminal L4 selective nerve root epidurals that initially provided 75% improvement in radiating pain and axial back pain. Unfortunately her second set of similar epidurals did not provide relief of her axial back pain nor her radicular pain. Ultimately we placed her on tramadol, she needed up to 3 pills per day which seemed to control her pain sufficiently, and allowed her to be functional.  Past medical history:  Negative.  See flowsheet/record as well for more information.  Surgical history: Negative.  See flowsheet/record as well for more information.  Family history: Negative.  See flowsheet/record as well for more information.  Social history: Negative.  See flowsheet/record as well for more information.  Allergies, and medications have been entered into the medical record, reviewed, and no changes needed.   Review of Systems: No fevers, chills, night sweats, weight loss, chest pain, or shortness of breath.   Objective:    General: Well Developed, well nourished, and in no acute distress.  Neuro: Alert and oriented x3, extra-ocular muscles intact, sensation grossly intact.  HEENT: Normocephalic, atraumatic, pupils equal round reactive to light, neck supple, no masses, no lymphadenopathy, thyroid nonpalpable.  Skin: Warm and dry, no rashes. Cardiac: Regular rate and rhythm, no murmurs rubs or gallops, no lower extremity edema.  Respiratory: Clear to auscultation bilaterally. Not using accessory muscles, speaking in full sentences.  Impression and Recommendations:     Lumbar degenerative disc disease History of a L4 laminectomy in 2014, and then a L4-L5 posterior lumbar interbody fusion in 2016. Initially did well, but after a year or 2 had a recurrence of pain. Pain was in a right L3 and L4 nerve root distribution, right sided transforaminal L3 and L4 selective epidurals provided initial 75% improvement in pain. We repeated the epidurals recently and did not get much relief. She does do well with tramadol 50 mg 3 times per day, I'm going to refill this, she understands that I'm going to also get her set up with medical pain management. I also asked her to get some advice from our neurosurgeons downstairs.  I spent 25 minutes with this patient, greater than 50% was face-to-face time counseling regarding the above diagnoses ___________________________________________ Gwen Her. Dianah Field, M.D., ABFM., CAQSM. Primary Care and [place] Instructor of [place] [employer] [facility] [employer]

## 2017-06-07 NOTE — Assessment & Plan Note (Signed)
History of a L4 laminectomy in 2014, and then a L4-L5 posterior lumbar interbody fusion in 2016. Initially did well, but after a year or 2 had a recurrence of pain. Pain was in a right L3 and L4 nerve root distribution, right sided transforaminal L3 and L4 selective epidurals provided initial 75% improvement in pain. We repeated the epidurals recently and did not get much relief. She does do well with tramadol 50 mg 3 times per day, I'm going to refill this, she understands that I'm going to also get her set up with medical pain management. I also asked her to get some advice from our neurosurgeons downstairs.

## 2017-06-15 ENCOUNTER — Other Ambulatory Visit: Payer: Self-pay | Admitting: Family Medicine

## 2017-07-11 ENCOUNTER — Other Ambulatory Visit: Payer: Self-pay | Admitting: Neurosurgery

## 2017-08-12 ENCOUNTER — Encounter (HOSPITAL_COMMUNITY): Payer: Self-pay

## 2017-08-12 NOTE — Pre-Procedure Instructions (Addendum)
Loretta Bullock  08/12/2017      [id number] [address], [place] [address] Phone: [phone] Fax: [phone]    Your procedure is scheduled on Nov 28  Report to [facility] at 915 A.M.  Call this number if you have problems the morning of surgery:  [phone]   Remember:  Do not eat food or drink liquids after midnight.  Take these medicines the morning of surgery with A SIP OF WATER Allegra, tramadol (Ultram) if needed,Venlafaxine (Effexor-XR),  Stop taking aspirin, BC's, Goody's, Herbal medications, Fish Oil, Ibuprofen, Advil, motrin, Aleve, Vitamins Bring your Cpap mask with you on the day of surgery.    Do not wear jewelry, make-up or nail polish.  Do not wear lotions, powders, or perfumes, or deoderant.  Do not shave 48 hours prior to surgery.  Men may shave face and neck.  Do not bring valuables to the hospital.  [facility] is not responsible for any belongings or valuables.  Contacts, dentures or bridgework may not be worn into surgery.  Leave your suitcase in the car.  After surgery it may be brought to your room.  For patients admitted to the hospital, discharge time will be determined by your treatment team.  Patients discharged the day of surgery will not be allowed to drive home.    Special instructions:  [facility] - Preparing for Surgery  Before surgery, you can play an important role.  Because skin is not sterile, your skin needs to be as free of germs as possible.  You can reduce the number of germs on you skin by washing with CHG (chlorahexidine gluconate) soap before surgery.  CHG is an antiseptic cleaner which kills germs and bonds with the skin to continue killing germs even after washing.  Please DO NOT use if you have an allergy to CHG or antibacterial soaps.  If your skin becomes reddened/irritated stop using the CHG and inform your nurse when you arrive at  Short Stay.  Do not shave (including legs and underarms) for at least 48 hours prior to the first CHG shower.  You may shave your face.  Please follow these instructions carefully:   1.  Shower with CHG Soap the night before surgery and the  morning of Surgery.  2.  If you choose to wash your hair, wash your hair first as usual with your   normal shampoo.  3.  After you shampoo, rinse your hair and body thoroughly to remove the  Shampoo.  4.  Use CHG as you would any other liquid soap.  You can apply chg directly  to the skin and wash gently with scrungie or a clean washcloth.  5.  Apply the CHG Soap to your body ONLY FROM THE NECK DOWN.   Do not use on open wounds or open sores.  Avoid contact with your eyes,  ears, mouth and genitals (private parts).  Wash genitals (private parts)   with your normal soap.  6.  Wash thoroughly, paying special attention to the area where your surgery  will be performed.  7.  Thoroughly rinse your body with warm water from the neck down.  8.  DO NOT shower/wash with your normal soap after using and rinsing off  the CHG Soap.  9.  Pat yourself dry with a clean towel.            10.  Wear clean pajamas.  11.  Place clean sheets on your bed the night of your first shower and do not   sleep with pets.  Day of Surgery  Do not apply any lotions/deoderants the morning of surgery.  Please wear clean clothes to the hospital/surgery center.     Please read over the following fact sheets that you were given. Pain Booklet, Coughing and Deep Breathing, MRSA Information and Surgical Site Infection Prevention

## 2017-08-15 ENCOUNTER — Encounter (HOSPITAL_COMMUNITY)
Admission: RE | Admit: 2017-08-15 | Discharge: 2017-08-15 | Disposition: A | Discharge status: Home / Self Care | Payer: BLUE CROSS/BLUE SHIELD | Source: Ambulatory Visit | Attending: Neurosurgery | Admitting: Neurosurgery

## 2017-08-15 ENCOUNTER — Encounter (HOSPITAL_COMMUNITY): Payer: Self-pay

## 2017-08-15 DIAGNOSIS — R9431 Abnormal electrocardiogram [ECG] [EKG]: Secondary | ICD-10-CM | POA: Insufficient documentation

## 2017-08-15 DIAGNOSIS — M5416 Radiculopathy, lumbar region: Secondary | ICD-10-CM | POA: Insufficient documentation

## 2017-08-15 DIAGNOSIS — I10 Essential (primary) hypertension: Secondary | ICD-10-CM | POA: Insufficient documentation

## 2017-08-15 DIAGNOSIS — Z01818 Encounter for other preprocedural examination: Secondary | ICD-10-CM | POA: Insufficient documentation

## 2017-08-15 HISTORY — DX: Essential (primary) hypertension: I10

## 2017-08-15 HISTORY — DX: Other specified postprocedural states: Z98.890

## 2017-08-15 HISTORY — DX: Nausea with vomiting, unspecified: R11.2

## 2017-08-15 HISTORY — DX: Anxiety disorder, unspecified: F41.9

## 2017-08-15 LAB — SURGICAL PCR SCREEN
MRSA, PCR: NEGATIVE
Staphylococcus aureus: NEGATIVE

## 2017-08-15 LAB — TYPE AND SCREEN
ABO/RH(D): A POS
ANTIBODY SCREEN: NEGATIVE

## 2017-08-15 LAB — CBC
HCT: 40.9 % (ref 36.0–46.0)
HEMOGLOBIN: 14.1 g/dL (ref 12.0–15.0)
MCH: 29.4 pg (ref 26.0–34.0)
MCHC: 34.5 g/dL (ref 30.0–36.0)
MCV: 85.4 fL (ref 78.0–100.0)
Platelets: 236 10*3/uL (ref 150–400)
RBC: 4.79 MIL/uL (ref 3.87–5.11)
RDW: 12.6 % (ref 11.5–15.5)
WBC: 3.9 10*3/uL — ABNORMAL LOW (ref 4.0–10.5)

## 2017-08-15 LAB — BASIC METABOLIC PANEL
Anion gap: 8 (ref 5–15)
BUN: 11 mg/dL (ref 6–20)
CHLORIDE: 102 mmol/L (ref 101–111)
CO2: 27 mmol/L (ref 22–32)
Calcium: 10.5 mg/dL — ABNORMAL HIGH (ref 8.9–10.3)
Creatinine, Ser: 0.85 mg/dL (ref 0.44–1.00)
GFR calc Af Amer: >60 mL/min (ref >60)
GFR calc non Af Amer: >60 mL/min (ref >60)
GLUCOSE: 114 mg/dL — AB (ref 65–99)
POTASSIUM: 3.4 mmol/L — AB (ref 3.5–5.1)
Sodium: 137 mmol/L (ref 135–145)

## 2017-08-15 LAB — ABO/RH: ABO/RH(D): A POS

## 2017-08-15 MED ORDER — CHLORHEXIDINE GLUCONATE CLOTH 2 % EX PADS: 6.0000 | MEDICATED_PAD | Freq: Once | CUTANEOUS | Status: DC

## 2017-08-17 ENCOUNTER — Inpatient Hospital Stay (HOSPITAL_COMMUNITY)
Admission: RE | Admit: 2017-08-17 | Discharge: 2017-08-19 | DRG: 460 | Disposition: A | Discharge status: Home / Self Care | Payer: BLUE CROSS/BLUE SHIELD | Source: Ambulatory Visit | Attending: Neurosurgery | Admitting: Neurosurgery

## 2017-08-17 ENCOUNTER — Encounter (HOSPITAL_COMMUNITY)
Admission: RE | Disposition: A | Discharge status: Home / Self Care | Payer: Self-pay | Source: Ambulatory Visit | Attending: Neurosurgery

## 2017-08-17 ENCOUNTER — Inpatient Hospital Stay (HOSPITAL_COMMUNITY): Payer: BLUE CROSS/BLUE SHIELD

## 2017-08-17 ENCOUNTER — Inpatient Hospital Stay (HOSPITAL_COMMUNITY): Payer: BLUE CROSS/BLUE SHIELD | Admitting: Vascular Surgery

## 2017-08-17 ENCOUNTER — Encounter (HOSPITAL_COMMUNITY): Payer: Self-pay | Admitting: *Deleted

## 2017-08-17 DIAGNOSIS — G4730 Sleep apnea, unspecified: Secondary | ICD-10-CM | POA: Diagnosis present

## 2017-08-17 DIAGNOSIS — M545 Low back pain: Secondary | ICD-10-CM | POA: Diagnosis present

## 2017-08-17 DIAGNOSIS — I10 Essential (primary) hypertension: Secondary | ICD-10-CM | POA: Diagnosis present

## 2017-08-17 DIAGNOSIS — E039 Hypothyroidism, unspecified: Secondary | ICD-10-CM | POA: Diagnosis present

## 2017-08-17 DIAGNOSIS — Z79899 Other long term (current) drug therapy: Secondary | ICD-10-CM | POA: Diagnosis not present

## 2017-08-17 DIAGNOSIS — Z419 Encounter for procedure for purposes other than remedying health state, unspecified: Secondary | ICD-10-CM

## 2017-08-17 DIAGNOSIS — M48061 Spinal stenosis, lumbar region without neurogenic claudication: Principal | ICD-10-CM | POA: Diagnosis present

## 2017-08-17 DIAGNOSIS — M5416 Radiculopathy, lumbar region: Secondary | ICD-10-CM | POA: Diagnosis present

## 2017-08-17 DIAGNOSIS — F419 Anxiety disorder, unspecified: Secondary | ICD-10-CM | POA: Diagnosis present

## 2017-08-17 DIAGNOSIS — Z6841 Body Mass Index (BMI) 40.0 and over, adult: Secondary | ICD-10-CM

## 2017-08-17 DIAGNOSIS — Z87891 Personal history of nicotine dependence: Secondary | ICD-10-CM

## 2017-08-17 HISTORY — PX: LUMBAR FUSION: SHX111

## 2017-08-17 HISTORY — DX: Other cervical disc degeneration, unspecified cervical region: M50.30

## 2017-08-17 SURGERY — POSTERIOR LUMBAR FUSION 1 LEVEL
Anesthesia: General | Site: Back

## 2017-08-17 MED ORDER — BUPIVACAINE HCL (PF) 0.5 % IJ SOLN
INTRAMUSCULAR | Status: AC
  Filled 2017-08-17: qty 30

## 2017-08-17 MED ORDER — MIDAZOLAM HCL 5 MG/5ML IJ SOLN
INTRAMUSCULAR | Status: DC | PRN
  Administered 2017-08-17: 2 mg via INTRAVENOUS

## 2017-08-17 MED ORDER — SODIUM CHLORIDE 0.9% FLUSH
3.0000 mL | Freq: Two times a day (BID) | INTRAVENOUS | Status: DC
  Administered 2017-08-17: 3 mL via INTRAVENOUS

## 2017-08-17 MED ORDER — DIAZEPAM 5 MG PO TABS: 5.0000 mg | ORAL_TABLET | Freq: Four times a day (QID) | ORAL | Status: DC | PRN

## 2017-08-17 MED ORDER — DEXAMETHASONE SODIUM PHOSPHATE 10 MG/ML IJ SOLN
INTRAMUSCULAR | Status: AC
  Filled 2017-08-17: qty 1

## 2017-08-17 MED ORDER — PROGESTERONE MICRONIZED 100 MG PO CAPS: 200.0000 mg | ORAL_CAPSULE | ORAL | Status: DC

## 2017-08-17 MED ORDER — ALUM & MAG HYDROXIDE-SIMETH 200-200-20 MG/5ML PO SUSP: 30.0000 mL | Freq: Four times a day (QID) | ORAL | Status: DC | PRN

## 2017-08-17 MED ORDER — SCOPOLAMINE 1 MG/3DAYS TD PT72
1.0000 | MEDICATED_PATCH | TRANSDERMAL | Status: AC
  Administered 2017-08-17: 1 via TRANSDERMAL
  Filled 2017-08-17: qty 1

## 2017-08-17 MED ORDER — ONDANSETRON HCL 4 MG/2ML IJ SOLN: 4.0000 mg | Freq: Once | INTRAMUSCULAR | Status: DC | PRN

## 2017-08-17 MED ORDER — ADULT MULTIVITAMIN W/MINERALS CH
1.0000 | ORAL_TABLET | Freq: Every day | ORAL | Status: DC
  Administered 2017-08-17 – 2017-08-19 (×3): 1 via ORAL
  Filled 2017-08-17 (×3): qty 1

## 2017-08-17 MED ORDER — 0.9 % SODIUM CHLORIDE (POUR BTL) OPTIME
TOPICAL | Status: DC | PRN
  Administered 2017-08-17: 1000 mL

## 2017-08-17 MED ORDER — PHENYLEPHRINE 40 MCG/ML (10ML) SYRINGE FOR IV PUSH (FOR BLOOD PRESSURE SUPPORT)
PREFILLED_SYRINGE | INTRAVENOUS | Status: DC | PRN
  Administered 2017-08-17: 120 ug via INTRAVENOUS
  Administered 2017-08-17: 80 ug via INTRAVENOUS
  Administered 2017-08-17: 160 ug via INTRAVENOUS

## 2017-08-17 MED ORDER — SODIUM CHLORIDE 0.9 % IV SOLN: 250.0000 mL | INTRAVENOUS | Status: DC

## 2017-08-17 MED ORDER — OXYCODONE HCL 5 MG PO TABS
10.0000 mg | ORAL_TABLET | ORAL | Status: DC | PRN
  Administered 2017-08-17 – 2017-08-19 (×5): 10 mg via ORAL
  Filled 2017-08-17 (×5): qty 2

## 2017-08-17 MED ORDER — ZOLPIDEM TARTRATE 5 MG PO TABS
5.0000 mg | ORAL_TABLET | Freq: Every evening | ORAL | Status: DC | PRN
  Filled 2017-08-17: qty 1

## 2017-08-17 MED ORDER — ESTRADIOL 0.1 MG/24HR TD PTWK
0.1000 mg | MEDICATED_PATCH | TRANSDERMAL | Status: DC
  Administered 2017-08-18: 0.1 mg via TRANSDERMAL
  Filled 2017-08-17: qty 1

## 2017-08-17 MED ORDER — THROMBIN (RECOMBINANT) 20000 UNITS EX SOLR
CUTANEOUS | Status: DC | PRN
  Administered 2017-08-17: 20 mL via TOPICAL

## 2017-08-17 MED ORDER — DEXTROSE 5 % IV SOLN
INTRAVENOUS | Status: DC | PRN
  Administered 2017-08-17: 20 ug/min via INTRAVENOUS

## 2017-08-17 MED ORDER — DOCUSATE SODIUM 100 MG PO CAPS
100.0000 mg | ORAL_CAPSULE | Freq: Two times a day (BID) | ORAL | Status: DC
  Administered 2017-08-17 – 2017-08-19 (×4): 100 mg via ORAL
  Filled 2017-08-17 (×4): qty 1

## 2017-08-17 MED ORDER — POTASSIUM CHLORIDE IN NACL 20-0.9 MEQ/L-% IV SOLN
INTRAVENOUS | Status: DC
  Administered 2017-08-17: 20:00:00 via INTRAVENOUS
  Filled 2017-08-17: qty 1000

## 2017-08-17 MED ORDER — LIDOCAINE HCL (CARDIAC) 20 MG/ML IV SOLN
INTRAVENOUS | Status: DC | PRN
  Administered 2017-08-17: 100 mg via INTRAVENOUS

## 2017-08-17 MED ORDER — MAGNESIUM CITRATE PO SOLN: 1.0000 | Freq: Once | ORAL | Status: DC | PRN

## 2017-08-17 MED ORDER — IRBESARTAN 300 MG PO TABS
300.0000 mg | ORAL_TABLET | Freq: Every day | ORAL | Status: DC
  Administered 2017-08-17 – 2017-08-19 (×2): 300 mg via ORAL
  Filled 2017-08-17 (×3): qty 1

## 2017-08-17 MED ORDER — FENTANYL CITRATE (PF) 250 MCG/5ML IJ SOLN
INTRAMUSCULAR | Status: AC
  Filled 2017-08-17: qty 5

## 2017-08-17 MED ORDER — TRAMADOL HCL 50 MG PO TABS
50.0000 mg | ORAL_TABLET | Freq: Three times a day (TID) | ORAL | Status: DC | PRN
  Administered 2017-08-18: 50 mg via ORAL
  Filled 2017-08-17: qty 1

## 2017-08-17 MED ORDER — MULTIVITAMINS PO CAPS: 1.0000 | ORAL_CAPSULE | Freq: Every day | ORAL | Status: DC

## 2017-08-17 MED ORDER — MENTHOL 3 MG MT LOZG: 1.0000 | LOZENGE | OROMUCOSAL | Status: DC | PRN

## 2017-08-17 MED ORDER — SENNOSIDES-DOCUSATE SODIUM 8.6-50 MG PO TABS: 1.0000 | ORAL_TABLET | Freq: Every evening | ORAL | Status: DC | PRN

## 2017-08-17 MED ORDER — KETOTIFEN FUMARATE 0.025 % OP SOLN
1.0000 [drp] | Freq: Two times a day (BID) | OPHTHALMIC | Status: DC
  Administered 2017-08-17 – 2017-08-19 (×4): 1 [drp] via OPHTHALMIC
  Filled 2017-08-17: qty 5

## 2017-08-17 MED ORDER — VENLAFAXINE HCL ER 75 MG PO CP24
75.0000 mg | ORAL_CAPSULE | Freq: Every day | ORAL | Status: DC
  Administered 2017-08-18 – 2017-08-19 (×2): 75 mg via ORAL
  Filled 2017-08-17 (×2): qty 1

## 2017-08-17 MED ORDER — GABAPENTIN 300 MG PO CAPS
300.0000 mg | ORAL_CAPSULE | Freq: Three times a day (TID) | ORAL | Status: DC
  Filled 2017-08-17 (×2): qty 1

## 2017-08-17 MED ORDER — FENOFIBRATE 160 MG PO TABS
160.0000 mg | ORAL_TABLET | Freq: Every day | ORAL | Status: DC
  Administered 2017-08-17 – 2017-08-19 (×3): 160 mg via ORAL
  Filled 2017-08-17 (×3): qty 1

## 2017-08-17 MED ORDER — SUGAMMADEX SODIUM 200 MG/2ML IV SOLN
INTRAVENOUS | Status: DC | PRN
  Administered 2017-08-17: 200 mg via INTRAVENOUS

## 2017-08-17 MED ORDER — LIDOCAINE-EPINEPHRINE 0.5 %-1:200000 IJ SOLN
INTRAMUSCULAR | Status: AC
  Filled 2017-08-17: qty 1

## 2017-08-17 MED ORDER — FENOFIBRIC ACID 135 MG PO CPDR: 1.0000 | DELAYED_RELEASE_CAPSULE | Freq: Every day | ORAL | Status: DC

## 2017-08-17 MED ORDER — SUCCINYLCHOLINE CHLORIDE 200 MG/10ML IV SOSY
PREFILLED_SYRINGE | INTRAVENOUS | Status: AC
  Filled 2017-08-17: qty 10

## 2017-08-17 MED ORDER — DEXAMETHASONE SODIUM PHOSPHATE 10 MG/ML IJ SOLN
INTRAMUSCULAR | Status: DC | PRN
  Administered 2017-08-17: 10 mg via INTRAVENOUS

## 2017-08-17 MED ORDER — PROPOFOL 10 MG/ML IV BOLUS
INTRAVENOUS | Status: DC | PRN
  Administered 2017-08-17: 120 mg via INTRAVENOUS

## 2017-08-17 MED ORDER — SODIUM CHLORIDE 0.9% FLUSH: 3.0000 mL | INTRAVENOUS | Status: DC | PRN

## 2017-08-17 MED ORDER — THROMBIN (RECOMBINANT) 5000 UNITS EX SOLR
CUTANEOUS | Status: AC
  Filled 2017-08-17: qty 5000

## 2017-08-17 MED ORDER — ACETAMINOPHEN 325 MG PO TABS
650.0000 mg | ORAL_TABLET | ORAL | Status: DC | PRN
  Administered 2017-08-19: 650 mg via ORAL
  Filled 2017-08-17: qty 2

## 2017-08-17 MED ORDER — BISACODYL 5 MG PO TBEC: 5.0000 mg | DELAYED_RELEASE_TABLET | Freq: Every day | ORAL | Status: DC | PRN

## 2017-08-17 MED ORDER — ROCURONIUM BROMIDE 100 MG/10ML IV SOLN
INTRAVENOUS | Status: DC | PRN
  Administered 2017-08-17 (×2): 20 mg via INTRAVENOUS
  Administered 2017-08-17: 50 mg via INTRAVENOUS
  Administered 2017-08-17: 10 mg via INTRAVENOUS

## 2017-08-17 MED ORDER — CEFAZOLIN SODIUM-DEXTROSE 2-4 GM/100ML-% IV SOLN
INTRAVENOUS | Status: AC
  Filled 2017-08-17: qty 100

## 2017-08-17 MED ORDER — KETOROLAC TROMETHAMINE 15 MG/ML IJ SOLN
15.0000 mg | Freq: Four times a day (QID) | INTRAMUSCULAR | Status: AC
  Administered 2017-08-17 – 2017-08-18 (×2): 15 mg via INTRAVENOUS
  Filled 2017-08-17 (×2): qty 1

## 2017-08-17 MED ORDER — ONDANSETRON HCL 4 MG/2ML IJ SOLN: 4.0000 mg | Freq: Four times a day (QID) | INTRAMUSCULAR | Status: DC | PRN

## 2017-08-17 MED ORDER — VALSARTAN-HYDROCHLOROTHIAZIDE 320-25 MG PO TABS: 1.0000 | ORAL_TABLET | Freq: Every day | ORAL | Status: DC

## 2017-08-17 MED ORDER — DIPHENHYDRAMINE HCL 50 MG/ML IJ SOLN
INTRAMUSCULAR | Status: DC | PRN
  Administered 2017-08-17: 12.5 mg via INTRAVENOUS

## 2017-08-17 MED ORDER — PHENOL 1.4 % MT LIQD: 1.0000 | OROMUCOSAL | Status: DC | PRN

## 2017-08-17 MED ORDER — ONDANSETRON HCL 4 MG/2ML IJ SOLN
INTRAMUSCULAR | Status: AC
  Filled 2017-08-17: qty 2

## 2017-08-17 MED ORDER — THROMBIN (RECOMBINANT) 20000 UNITS EX SOLR
CUTANEOUS | Status: AC
  Filled 2017-08-17: qty 20000

## 2017-08-17 MED ORDER — MEPERIDINE HCL 25 MG/ML IJ SOLN: 6.2500 mg | INTRAMUSCULAR | Status: DC | PRN

## 2017-08-17 MED ORDER — ONDANSETRON HCL 4 MG PO TABS: 4.0000 mg | ORAL_TABLET | Freq: Four times a day (QID) | ORAL | Status: DC | PRN

## 2017-08-17 MED ORDER — OMEGA-3-ACID ETHYL ESTERS 1 G PO CAPS
1.0000 g | ORAL_CAPSULE | Freq: Every day | ORAL | Status: DC
  Administered 2017-08-17 – 2017-08-19 (×3): 1 g via ORAL
  Filled 2017-08-17 (×3): qty 1

## 2017-08-17 MED ORDER — OXYCODONE HCL ER 10 MG PO T12A
10.0000 mg | EXTENDED_RELEASE_TABLET | Freq: Two times a day (BID) | ORAL | Status: DC
  Administered 2017-08-17 – 2017-08-19 (×4): 10 mg via ORAL
  Filled 2017-08-17 (×4): qty 1

## 2017-08-17 MED ORDER — LACTATED RINGERS IV SOLN
INTRAVENOUS | Status: DC
  Administered 2017-08-17 (×4): via INTRAVENOUS

## 2017-08-17 MED ORDER — MIDAZOLAM HCL 2 MG/2ML IJ SOLN
INTRAMUSCULAR | Status: AC
  Filled 2017-08-17: qty 2

## 2017-08-17 MED ORDER — MORPHINE SULFATE (PF) 4 MG/ML IV SOLN: 2.0000 mg | INTRAVENOUS | Status: DC | PRN

## 2017-08-17 MED ORDER — BUPIVACAINE HCL (PF) 0.5 % IJ SOLN
INTRAMUSCULAR | Status: DC | PRN
  Administered 2017-08-17: 30 mL

## 2017-08-17 MED ORDER — EPHEDRINE 5 MG/ML INJ
INTRAVENOUS | Status: AC
  Filled 2017-08-17: qty 10

## 2017-08-17 MED ORDER — THROMBIN (RECOMBINANT) 5000 UNITS EX SOLR
CUTANEOUS | Status: DC | PRN
  Administered 2017-08-17: 5 mL via TOPICAL

## 2017-08-17 MED ORDER — OXYCODONE HCL 5 MG PO TABS
5.0000 mg | ORAL_TABLET | ORAL | Status: DC | PRN
  Administered 2017-08-18: 5 mg via ORAL
  Filled 2017-08-17: qty 1

## 2017-08-17 MED ORDER — FENTANYL CITRATE (PF) 100 MCG/2ML IJ SOLN
INTRAMUSCULAR | Status: DC | PRN
  Administered 2017-08-17: 150 ug via INTRAVENOUS
  Administered 2017-08-17: 25 ug via INTRAVENOUS
  Administered 2017-08-17: 50 ug via INTRAVENOUS
  Administered 2017-08-17: 25 ug via INTRAVENOUS
  Administered 2017-08-17 (×2): 50 ug via INTRAVENOUS

## 2017-08-17 MED ORDER — HYDROCHLOROTHIAZIDE 25 MG PO TABS
25.0000 mg | ORAL_TABLET | Freq: Every day | ORAL | Status: DC
  Administered 2017-08-17 – 2017-08-19 (×2): 25 mg via ORAL
  Filled 2017-08-17 (×3): qty 1

## 2017-08-17 MED ORDER — HYDROMORPHONE HCL 1 MG/ML IJ SOLN
INTRAMUSCULAR | Status: AC
  Administered 2017-08-17: 0.5 mg via INTRAVENOUS
  Filled 2017-08-17: qty 1

## 2017-08-17 MED ORDER — ACETAMINOPHEN 650 MG RE SUPP: 650.0000 mg | RECTAL | Status: DC | PRN

## 2017-08-17 MED ORDER — CEFAZOLIN SODIUM-DEXTROSE 2-4 GM/100ML-% IV SOLN
2.0000 g | INTRAVENOUS | Status: AC
  Administered 2017-08-17: 2 g via INTRAVENOUS

## 2017-08-17 MED ORDER — HYDROMORPHONE HCL 1 MG/ML IJ SOLN
0.2500 mg | INTRAMUSCULAR | Status: DC | PRN
  Administered 2017-08-17 (×2): 0.5 mg via INTRAVENOUS

## 2017-08-17 MED ORDER — LORATADINE 10 MG PO TABS
10.0000 mg | ORAL_TABLET | Freq: Every day | ORAL | Status: DC
  Administered 2017-08-18 – 2017-08-19 (×2): 10 mg via ORAL
  Filled 2017-08-17 (×2): qty 1

## 2017-08-17 MED ORDER — ALBUMIN HUMAN 5 % IV SOLN
INTRAVENOUS | Status: DC | PRN
  Administered 2017-08-17: 14:00:00 via INTRAVENOUS

## 2017-08-17 MED ORDER — ONDANSETRON HCL 4 MG/2ML IJ SOLN
INTRAMUSCULAR | Status: DC | PRN
  Administered 2017-08-17: 4 mg via INTRAVENOUS

## 2017-08-17 SURGICAL SUPPLY — 69 items
ADH SKN CLS APL DERMABOND .7 (GAUZE/BANDAGES/DRESSINGS) ×1
APL SKNCLS STERI-STRIP NONHPOA (GAUZE/BANDAGES/DRESSINGS)
BENZOIN TINCTURE PRP APPL 2/3 (GAUZE/BANDAGES/DRESSINGS) IMPLANT
BLADE CLIPPER SURG (BLADE) IMPLANT
BUR MATCHSTICK NEURO 3.0 LAGG (BURR) ×3 IMPLANT
BUR PRECISION FLUTE 5.0 (BURR) ×3 IMPLANT
CAGE ALTERA 10X26 9-13 8D (Cage) ×1 IMPLANT
CAGE ALTERA 26MM 9-13-8 (Cage) ×1 IMPLANT
CANISTER SUCT 3000ML PPV (MISCELLANEOUS) ×3 IMPLANT
CAP LOCKING (Cap) ×4 IMPLANT
CAP LOCKING 5.5 CREO (Cap) IMPLANT
CARTRIDGE OIL MAESTRO DRILL (MISCELLANEOUS) ×1 IMPLANT
CLOSURE WOUND 1/2 X4 (GAUZE/BANDAGES/DRESSINGS)
CONT SPEC 4OZ CLIKSEAL STRL BL (MISCELLANEOUS) ×5 IMPLANT
COVER BACK TABLE 60X90IN (DRAPES) ×3 IMPLANT
DECANTER SPIKE VIAL GLASS SM (MISCELLANEOUS) ×3 IMPLANT
DERMABOND ADVANCED (GAUZE/BANDAGES/DRESSINGS) ×2
DERMABOND ADVANCED .7 DNX12 (GAUZE/BANDAGES/DRESSINGS) ×1 IMPLANT
DIFFUSER DRILL AIR PNEUMATIC (MISCELLANEOUS) ×3 IMPLANT
DRAPE C-ARM 42X72 X-RAY (DRAPES) ×6 IMPLANT
DRAPE C-ARMOR (DRAPES) IMPLANT
DRAPE LAPAROTOMY 100X72X124 (DRAPES) ×3 IMPLANT
DRAPE POUCH INSTRU U-SHP 10X18 (DRAPES) ×3 IMPLANT
DRAPE SURG 17X23 STRL (DRAPES) ×3 IMPLANT
DRSG OPSITE POSTOP 4X8 (GAUZE/BANDAGES/DRESSINGS) ×2 IMPLANT
DURAPREP 26ML APPLICATOR (WOUND CARE) ×3 IMPLANT
ELECT BLADE 4.0 EZ CLEAN MEGAD (MISCELLANEOUS) ×6
ELECT REM PT RETURN 9FT ADLT (ELECTROSURGICAL) ×3
ELECTRODE BLDE 4.0 EZ CLN MEGD (MISCELLANEOUS) IMPLANT
ELECTRODE REM PT RTRN 9FT ADLT (ELECTROSURGICAL) ×1 IMPLANT
GAUZE SPONGE 4X4 12PLY STRL (GAUZE/BANDAGES/DRESSINGS) IMPLANT
GAUZE SPONGE 4X4 16PLY XRAY LF (GAUZE/BANDAGES/DRESSINGS) IMPLANT
GLOVE ECLIPSE 6.5 STRL STRAW (GLOVE) ×6 IMPLANT
GLOVE EXAM NITRILE LRG STRL (GLOVE) IMPLANT
GLOVE EXAM NITRILE XL STR (GLOVE) IMPLANT
GLOVE EXAM NITRILE XS STR PU (GLOVE) IMPLANT
GOWN STRL REUS W/ TWL LRG LVL3 (GOWN DISPOSABLE) ×2 IMPLANT
GOWN STRL REUS W/ TWL XL LVL3 (GOWN DISPOSABLE) IMPLANT
GOWN STRL REUS W/TWL 2XL LVL3 (GOWN DISPOSABLE) IMPLANT
GOWN STRL REUS W/TWL LRG LVL3 (GOWN DISPOSABLE) ×6
GOWN STRL REUS W/TWL XL LVL3 (GOWN DISPOSABLE)
HEMOSTAT POWDER KIT SURGIFOAM (HEMOSTASIS) ×2 IMPLANT
KIT BASIN OR (CUSTOM PROCEDURE TRAY) ×3 IMPLANT
KIT POSITION SURG JACKSON T1 (MISCELLANEOUS) ×3 IMPLANT
KIT ROOM TURNOVER OR (KITS) ×3 IMPLANT
NDL HYPO 25X1 1.5 SAFETY (NEEDLE) ×1 IMPLANT
NDL SPNL 18GX3.5 QUINCKE PK (NEEDLE) IMPLANT
NEEDLE HYPO 25X1 1.5 SAFETY (NEEDLE) ×3 IMPLANT
NEEDLE SPNL 18GX3.5 QUINCKE PK (NEEDLE) IMPLANT
NS IRRIG 1000ML POUR BTL (IV SOLUTION) ×3 IMPLANT
OIL CARTRIDGE MAESTRO DRILL (MISCELLANEOUS) ×3
PACK LAMINECTOMY NEURO (CUSTOM PROCEDURE TRAY) ×3 IMPLANT
PAD ARMBOARD 7.5X6 YLW CONV (MISCELLANEOUS) ×6 IMPLANT
ROD SPINAL 35MM (Rod) ×2 IMPLANT
SCREW CREO 6.5X40 (Screw) ×2 IMPLANT
SPONGE LAP 4X18 X RAY DECT (DISPOSABLE) IMPLANT
SPONGE SURGIFOAM ABS GEL 100 (HEMOSTASIS) ×3 IMPLANT
STRIP BIOACTIVE 10CC 25X50X8 (Miscellaneous) ×2 IMPLANT
STRIP CLOSURE SKIN 1/2X4 (GAUZE/BANDAGES/DRESSINGS) IMPLANT
SUT PROLENE 6 0 BV (SUTURE) IMPLANT
SUT VIC AB 0 CT1 18XCR BRD8 (SUTURE) ×1 IMPLANT
SUT VIC AB 0 CT1 8-18 (SUTURE) ×6
SUT VIC AB 2-0 CT1 18 (SUTURE) ×3 IMPLANT
SUT VIC AB 3-0 SH 8-18 (SUTURE) ×5 IMPLANT
TOWEL GREEN STERILE (TOWEL DISPOSABLE) ×3 IMPLANT
TOWEL GREEN STERILE FF (TOWEL DISPOSABLE) ×3 IMPLANT
TRAY FOLEY W/METER SILVER 16FR (SET/KITS/TRAYS/PACK) ×3 IMPLANT
TULIP CREP AMP 5.5MM (Orthopedic Implant) ×2 IMPLANT
WATER STERILE IRR 1000ML POUR (IV SOLUTION) ×3 IMPLANT

## 2017-08-17 NOTE — H&P (Signed)
BP (!) 148/86   Pulse 98   Temp 98.2 F (36.8 C) (Oral)   Resp 20   Ht 5' (1.524 m)   Wt 93.4 kg (206 lb)   LMP 12/21/2012   SpO2 95%   BMI 40.23 kg/m  Loretta Bullock is a patient of ours who Dr. Argentina Donovan performed an L4-5 PLIF on October 10, 2014 secondary to stenosis and spondylolisthesis. She did very well after that operation and was doing well until May 2018, when she had recurrent pain in the right lower extremity, which is where it was initially. An MRI was performed and it showed facet arthropathy and foraminal narrowing on the right side at L3-4.Marland Kitchen The left side looks good and she has no discomfort whatsoever. She underwent 2 epidural injections. The first was helpful, the second was not. She has pain into the groin that runs past the knee.  She is 5 feet in height and weighs 208 pounds. Temperature is 97.9, blood pressure is 141/94, pulse is 91, pain is 6/10.  She has also undergone a lumbar discectomy in September 2014 in [place] and has had a cesarean section.  She takes tramadol, fish oil, valsartan, fenofibrate, venlafaxine hormonal patch, multivitamin, and Allegra.  Mother, 78, is in poor health, COPD, osteoarthritis, hypertension, hypercholesterolemia. Father is deceased, had COPD, cluster headaches, and hypercholesterolemia.  She says the pain started in May, gradually came on. She is not able to identify any specific etiology. She takes tramadol and the pain is 4/10 when she does.  Review of systems is positive for night sweats, hypercholesterolemia, back pain, leg pain, and joint pain in the knee.  On exam, she has 5/5 strength, 2+ reflexes at the knees and ankles. You just have to tap the right knee a little bit harder to achieve that 2+. She has a normal gait, can toe walk and heel walk. Wound is well-healed. Memory, language, attention, and fund of knowledge are normal. Speech is clear and fluent.

## 2017-08-17 NOTE — Progress Notes (Signed)
Patient arrived to 3W02 from PACU. Family at bedside. Safety precautions and orders reviewed with patient. VSS. SCDs ordered. No other distress noted. Will continue to monitor.  Ave Filter, RN

## 2017-08-17 NOTE — Anesthesia Procedure Notes (Signed)
Procedure Name: Intubation Date/Time: 08/17/2017 12:24 PM Performed by: Carney Living, CRNA Pre-anesthesia Checklist: Patient identified, Emergency Drugs available, Suction available, Patient being monitored and Timeout performed Patient Re-evaluated:Patient Re-evaluated prior to induction Oxygen Delivery Method: Circle system utilized Preoxygenation: Pre-oxygenation with 100% oxygen Induction Type: IV induction Ventilation: Mask ventilation without difficulty Laryngoscope Size: Mac and 4 Grade View: Grade II Tube type: Oral Tube size: 7.0 mm Number of attempts: 1 Airway Equipment and Method: Stylet Placement Confirmation: ETT inserted through vocal cords under direct vision,  positive ETCO2 and breath sounds checked- equal and bilateral Secured at: 22 cm Tube secured with: Tape Dental Injury: Teeth and Oropharynx as per pre-operative assessment

## 2017-08-17 NOTE — Transfer of Care (Signed)
Immediate Anesthesia Transfer of Care Note  Patient: Loretta Bullock  Procedure(s) Performed: POSTERIOR LUMBAR INTERBODY FUSION LUMBAR THREE - LUMBAR FOUR (N/A Back)  Patient Location: PACU  Anesthesia Type:General  Level of Consciousness: awake, alert  and oriented  Airway & Oxygen Therapy: Patient Spontanous Breathing and Patient connected to nasal cannula oxygen  Post-op Assessment: Report given to RN and Post -op Vital signs reviewed and stable  Post vital signs: Reviewed and stable  Last Vitals:  Vitals:   08/17/17 0934  BP: (!) 148/86  Pulse: 98  Resp: 20  Temp: 36.8 C  SpO2: 95%    Last Pain:  Vitals:   08/17/17 0934  TempSrc: Oral         Complications: No apparent anesthesia complications

## 2017-08-17 NOTE — Op Note (Signed)
08/17/2017  4:52 PM  PATIENT:  Loretta Bullock  56 y.o. female presents with pain in the right side of the back, buttocks and lower extremity. She has had extensive conservative treatment without improvement. She has agreed to operative decompression of the right L3, and L4 nerve roots.   PRE-OPERATIVE DIAGNOSIS:  RADICULOPATHY, LUMBAR REGION Lateral recess stenosis right L3/4  POST-OPERATIVE DIAGNOSIS:  RADICULOPATHY, LUMBAR REGION Lateral recess stenosis right L3/4 PROCEDURE:  Procedure(s): right  POSTERIOR LUMBAR INTERBODY FUSION LUMBAR THREE - LUMBAR FOUR Non segmental pedicle screw fixation(globus Creo) L3-4 SURGEON:  Surgeon(s): Ashok Pall, MD Ditty, Kevan Ny, MD  ASSISTANTS:Ditty, Marland Kitchen  ANESTHESIA:   general  EBL:  Total I/O In: 3250 [I.V.:3000; IV Piggyback:250] Out: 350 [Urine:150; Blood:200]  BLOOD ADMINISTERED:none  CELL SAVER GIVEN:none  COUNT:per nursing  DRAINS: none   SPECIMEN:  No Specimen  DICTATION: Loretta Bullock is a 56 y.o. female whom was taken to the operating room intubated, and placed under a general anesthetic without difficulty. A foley catheter was placed under sterile conditions. She was positioned prone on a Jackson stable with all pressure points properly padded.  Her lumbar region was prepped and draped in a sterile manner. Using the superior portion of the existing lumbar incision, and extending it rostrally,  I opened the skin with a 10 blade and took the incision down to the thoracolumbar fascia. I exposed the lamina of L3 in a subperiosteal fashion on the right side. I confirmed my location with an intraoperative xray. I dissected and exposed the right side L4, and L5 screw. I did inspect the arthrodesis and did not appreciate movement. I removed the locking caps, and removed the L5 screw, and the rod. I left the L4 screw in place.   I placed self retaining retractors and started the decompression.  I decompressed the spinal canal  and the right L3 root, and L4 roots on the right side via an inferior L3 facetectomy. I used the drill and the Kerrison punches to unroof the nerve and to remove the inferior facet, and a portion of the L4 superior facet. This allowed for the decompression of the lateral recess and foramen. The L3 nerve root was quite swollen  A TLIF was performed at L3/4 on the right . I opened the disc space with an 11 blade then used a variety of instruments to remove the disc and prepare the space for the arthrodesis. I used curettes, rongeurs, punches, shavers for the disc space, and rasps in the discetomy. I measured the disc space and placed 1 Altera(globus) cage packed with auto and allograft morsels under fluoroscopic guidance in the disc space.    I placed a pedicle screw at L3, using fluoroscopic guidance. I drilled a pilot hole, then cannulated the pedicle with a sound . I then tapped each pedicle, assessing each site for pedicle violations. No cutouts were appreciated. The screw (Globus creo) was then placed at L3 without difficulty. We attached the rod and locking caps with the appropriate tools. The locking caps were secured with torque limited screwdrivers. Final films were performed and the final construct appeared to be in good position.  I closed the wound in a layered fashion. We approximated the thoracolumbar fascia, subcutaneous, and subcuticular planes with vicryl sutures. I used dermabond for a sterile dressing.     PLAN OF CARE: Admit to inpatient   PATIENT DISPOSITION:  PACU - hemodynamically stable.   Delay start of Pharmacological VTE agent (>24hrs) due to surgical blood loss  or risk of bleeding:  yes

## 2017-08-17 NOTE — Progress Notes (Signed)
Patient requesting for CPAP tonight. Paged Ferne Reus, PA. Orders received for CPAP . Will inform respiratory

## 2017-08-17 NOTE — Anesthesia Preprocedure Evaluation (Addendum)
Anesthesia Evaluation  Patient identified by MRN, date of birth, ID band Patient awake    History of Anesthesia Complications (+) PONV  Airway Mallampati: II  TM Distance: >3 FB Neck ROM: Full    Dental  (+) Teeth Intact, Dental Advisory Given   Pulmonary sleep apnea and Continuous Positive Airway Pressure Ventilation , former smoker,    Pulmonary exam normal        Cardiovascular hypertension, Pt. on medications Normal cardiovascular exam     Neuro/Psych Anxiety    GI/Hepatic   Endo/Other  Hypothyroidism Morbid obesity  Renal/GU      Musculoskeletal  (+) Arthritis , Osteoarthritis,    Abdominal   Peds  Hematology   Anesthesia Other Findings   Reproductive/Obstetrics                            Anesthesia Physical Anesthesia Plan  ASA: II  Anesthesia Plan: General   Post-op Pain Management:    Induction: Intravenous  PONV Risk Score and Plan: 4 or greater and Ondansetron, Dexamethasone and Treatment may vary due to age or medical condition  Airway Management Planned:   Additional Equipment:   Intra-op Plan:   Post-operative Plan: Extubation in OR  Informed Consent: I have reviewed the patients History and Physical, chart, labs and discussed the procedure including the risks, benefits and alternatives for the proposed anesthesia with the patient or authorized representative who has indicated his/her understanding and acceptance.   Dental advisory given  Plan Discussed with: CRNA and Surgeon  Anesthesia Plan Comments:        Anesthesia Quick Evaluation

## 2017-08-18 ENCOUNTER — Encounter (HOSPITAL_COMMUNITY): Payer: Self-pay | Admitting: General Practice

## 2017-08-18 ENCOUNTER — Other Ambulatory Visit: Payer: Self-pay

## 2017-08-18 NOTE — Progress Notes (Signed)
PT Cancellation Note  Patient Details Name: Loretta Bullock MRN: [id number] DOB: May 17, 1961   Cancelled Treatment:    Reason Eval/Treat Not Completed: (P) Other (comment)(Brace not yet present) Pt brace not yet in room. Nursing notified and order will be placed. PT will follow back this morning as able.  Loretta Bullock B. Migdalia Dk PT, DPT Acute Rehabilitation  [phone] Pager [phone]     [place] 08/18/2017, 7:35 AM

## 2017-08-18 NOTE — Anesthesia Postprocedure Evaluation (Signed)
Anesthesia Post Note  Patient: Loretta Bullock  Procedure(s) Performed: POSTERIOR LUMBAR INTERBODY FUSION LUMBAR THREE - LUMBAR FOUR (N/A Back)     Patient location during evaluation: PACU Anesthesia Type: General Level of consciousness: awake and alert Pain management: pain level controlled Vital Signs Assessment: post-procedure vital signs reviewed and stable Respiratory status: spontaneous breathing, nonlabored ventilation, respiratory function stable and patient connected to nasal cannula oxygen Cardiovascular status: blood pressure returned to baseline and stable Postop Assessment: no apparent nausea or vomiting Anesthetic complications: no    Last Vitals:  Vitals:   08/18/17 0503 08/18/17 0915  BP: 124/80 122/84  Pulse: 96 98  Resp: 18 16  Temp: 36.8 C 36.9 C  SpO2: 97% 100%    Last Pain:  Vitals:   08/18/17 0915  TempSrc: Oral  PainSc: 5                  [name] DAVID

## 2017-08-18 NOTE — Progress Notes (Signed)
PT Cancellation Note  Patient Details Name: Loretta Bullock MRN: [id number] DOB: 1961-02-21   Cancelled Treatment:    Reason Eval/Treat Not Completed: (P) Other (comment)(Brace not present) Nursing notified and called Ortho Tech. PT will follow back for evaluation as able.   Mairin Lindsley B. Migdalia Dk PT, DPT Acute Rehabilitation  [phone] Pager [phone]      [place] 08/18/2017, 10:33 AM

## 2017-08-18 NOTE — Evaluation (Signed)
Occupational Therapy Evaluation Patient Details Name: Loretta Bullock MRN: [id number] DOB: Nov 06, 1960 Today's Date: 08/18/2017    History of Present Illness 56 y.o. female presents with pain in the right side of the back, buttocks and lower extremity secondary to stenosis and spondylolisthesis. She has had extensive conservative treatment without improvement. s/p operative decompression of the right L3, and L4 nerve roots and L3-L4 lumbar fusion 08/18/17 PMH for  night sweats, hypercholesterolemia, back pain, leg pain, and joint pain in the knee   Clinical Impression   This 56 y/o F presents with the above. At baseline Pt is independent with ADLs and functional mobility. Pt completed room level functional mobility at RW level with MinGuard assist, requires minguard-minA for LB ADLs using AE. Education provided on back precautions, AE/DME and compensatory techniques for completing ADLs and functional mobility transfers while adhering to precautions with Pt return demonstrating and verbalizing good understanding; questions answered throughout. Pt will return home with available assist from spouse PRN. No further acute OT needs identified at this time. Will sign off.     Follow Up Recommendations  No OT follow up;Supervision/Assistance - 24 hour    Equipment Recommendations  Tub/shower seat           Precautions / Restrictions Precautions Precautions: Fall;Back Precaution Booklet Issued: Yes (comment) Precaution Comments: verbally reviewed back precautions Required Braces or Orthoses: Spinal Brace Spinal Brace: Lumbar corset;Applied in sitting position Restrictions Weight Bearing Restrictions: No      Mobility Bed Mobility Overal bed mobility: Needs Assistance Bed Mobility: Supine to Sit     Supine to sit: Min guard     General bed mobility comments: close guard for safety, verbal cues for proper technique to log roll  Transfers Overall transfer level: Needs  assistance Equipment used: Rolling walker (2 wheeled);None Transfers: Sit to/from Stand Sit to Stand: Min guard         General transfer comment: close guard for safety, verbal cues for hand placement     Balance Overall balance assessment: Needs assistance Sitting-balance support: Feet supported Sitting balance-Leahy Scale: Good     Standing balance support: Bilateral upper extremity supported;No upper extremity supported;During functional activity Standing balance-Leahy Scale: Fair                             ADL either performed or assessed with clinical judgement   ADL Overall ADL's : Needs assistance/impaired Eating/Feeding: Independent;Sitting   Grooming: Wash/dry hands;Min guard;Standing   Upper Body Bathing: Min guard;Sitting   Lower Body Bathing: Sit to/from stand;Min guard;With adaptive equipment;Adhering to back precautions   Upper Body Dressing : Minimal assistance;Sitting Upper Body Dressing Details (indicate cue type and reason): MinA to don back brace Lower Body Dressing: Minimal assistance;Sit to/from stand;With adaptive equipment;Adhering to hip precautions   Toilet Transfer: Min guard;Ambulation;Regular Glass blower/designer Details (indicate cue type and reason): Pt able to sit/stand from regular toilet while maintaining back precautions without external assist  Toileting- Clothing Manipulation and Hygiene: Min guard;Sit to/from stand;Adhering to back precautions     Tub/Shower Transfer Details (indicate cue type and reason): educated on safe transfer technique; educated on use of shower chair in shower for increased safety during task completion  Functional mobility during ADLs: Min guard;Rolling walker General ADL Comments: education provided on back precautions, AE/DME and compensatory techniques for completing ADLs while adhering to precautions with Pt verbalizing and return demonstrating understanding; min verbal cues for adhering to back  precautions  Pertinent Vitals/Pain Pain Assessment: Faces Faces Pain Scale: No hurt          Extremity/Trunk Assessment Upper Extremity Assessment Upper Extremity Assessment: Overall WFL for tasks assessed   Lower Extremity Assessment Lower Extremity Assessment: Defer to PT evaluation       Communication Communication Communication: No difficulties   Cognition Arousal/Alertness: Awake/alert Behavior During Therapy: WFL for tasks assessed/performed Overall Cognitive Status: Within Functional Limits for tasks assessed                                                      Home Living Family/patient expects to be discharged to:: Private residence Living Arrangements: Spouse/significant other Available Help at Discharge: Family Type of Home: House Home Access: Stairs to enter           [employer] Shower/Tub: Occupational psychologist: Standard     Home Equipment: None          Prior Functioning/Environment Level of Independence: Independent                 OT Problem List: Decreased knowledge of use of DME or AE;Decreased knowledge of precautions;Decreased activity tolerance            OT Goals(Current goals can be found in the care plan section) Acute Rehab OT Goals Patient Stated Goal: return home/regain independence  OT Goal Formulation: All assessment and education complete, DC therapy                                 AM-PAC PT "6 Clicks" Daily Activity     Outcome Measure Help from another person eating meals?: None Help from another person taking care of personal grooming?: None Help from another person toileting, which includes using toliet, bedpan, or urinal?: A Little Help from another person bathing (including washing, rinsing, drying)?: A Little Help from another person to put on and taking off regular upper body clothing?: A Little Help from another person to put on  and taking off regular lower body clothing?: A Little 6 Click Score: 20   End of Session Equipment Utilized During Treatment: Gait belt;Rolling walker;Back brace Nurse Communication: Mobility status  Activity Tolerance: Patient tolerated treatment well Patient left: in bed;with call bell/phone within reach;with family/visitor present  OT Visit Diagnosis: Other abnormalities of gait and mobility (R26.89)                Time: 6256-3893 OT Time Calculation (min): 25 min Charges:  OT General Charges $OT Visit: 1 Visit OT Evaluation $OT Eval Low Complexity: 1 Low G-Codes:     Lou Cal, OT Pager [phone] 08/18/2017   Raymondo Band 08/18/2017, 2:33 PM

## 2017-08-18 NOTE — Progress Notes (Signed)
Orthopedic Tech Progress Note Patient Details:  Loretta Bullock 30-Nov-1960 [id number]  Patient ID: [name], female   DOB: 1961-04-09, 56 y.o.   MRN: [id number]   Hildred Priest 08/18/2017, 9:10 AM Called in bio-tech brace order; spoke with Bella Kennedy

## 2017-08-18 NOTE — Evaluation (Signed)
Physical Therapy Evaluation Patient Details Name: Loretta Bullock MRN: [id number] DOB: Jul 30, 1961 Today's Date: 08/18/2017   History of Present Illness  56 y.o. female presents with pain in the right side of the back, buttocks and lower extremity secondary to stenosis and spondylolisthesis. She has had extensive conservative treatment without improvement. s/p operative decompression of the right L3, and L4 nerve roots and L3-L4 lumbar fusion 08/18/17 PMH for  night sweats, hypercholesterolemia, back pain, leg pain, and joint pain in the knee  Clinical Impression  Patient presents close to functional baseline and after education on precautions is no longer in need of skilled PT at this time.  Demonstrating safe mobility with cues and S to occasional minguard assist.  Family able to assist as needed.  Will sign off.     Follow Up Recommendations No PT follow up    Equipment Recommendations  None recommended by PT    Recommendations for Other Services       Precautions / Restrictions Precautions Precautions: Fall;Back Precaution Booklet Issued: Yes (comment) Precaution Comments: verbally reviewed back precautions Required Braces or Orthoses: Spinal Brace Spinal Brace: Lumbar corset;Applied in sitting position Restrictions Weight Bearing Restrictions: No      Mobility  Bed Mobility Overal bed mobility: Needs Assistance Bed Mobility: Rolling;Sidelying to Sit;Sit to Sidelying Rolling: Supervision Sidelying to sit: Supervision Supine to sit: Min guard   Sit to sidelying: Supervision General bed mobility comments: close guard for safety, verbal cues for proper technique to log roll  Transfers Overall transfer level: Needs assistance Equipment used: None Transfers: Sit to/from Stand Sit to Stand: Supervision         General transfer comment: close guard for safety, verbal cues for hand placement   Ambulation/Gait Ambulation/Gait assistance: Min guard;Supervision Ambulation  Distance (Feet): 200 Feet Assistive device: None Gait Pattern/deviations: Step-through pattern;Decreased stride length     General Gait Details: minguard at times and initially reaching for support surfaces, then more stable without UE support as walking more  Stairs Stairs: Yes Stairs assistance: Supervision;Min guard Stair Management: One rail Right;Alternating pattern;Step to pattern Number of Stairs: 3 General stair comments: cues for sequence, but pt performing self selected sequence  Wheelchair Mobility    Modified Rankin (Stroke Patients Only)       Balance Overall balance assessment: Needs assistance Sitting-balance support: Feet supported Sitting balance-Leahy Scale: Good     Standing balance support: Bilateral upper extremity supported;No upper extremity supported;During functional activity Standing balance-Leahy Scale: Fair                               Pertinent Vitals/Pain Pain Assessment: Faces Faces Pain Scale: Hurts a little bit Pain Location: incisional Pain Descriptors / Indicators: Sore Pain Intervention(s): Monitored during session    Home Living Family/patient expects to be discharged to:: Private residence Living Arrangements: Spouse/significant other Available Help at Discharge: Family Type of Home: House Home Access: Stairs to enter Entrance Stairs-Rails: None Technical brewer of Steps: 3 Home Layout: One level Home Equipment: None      Prior Function Level of Independence: Independent         Comments: works at desk job     Journalist, newspaper        Extremity/Trunk Assessment   Upper Extremity Assessment Upper Extremity Assessment: Overall WFL for tasks assessed    Lower Extremity Assessment Lower Extremity Assessment: Overall WFL for tasks assessed       Communication  Communication: No difficulties  Cognition Arousal/Alertness: Awake/alert Behavior During Therapy: WFL for tasks  assessed/performed Overall Cognitive Status: Within Functional Limits for tasks assessed                                        General Comments General comments (skin integrity, edema, etc.): Issued handout with precautions and reviewed with pt and discussed implementation of sitting position at her workplace.    Exercises     Assessment/Plan    PT Assessment Patent does not need any further PT services  PT Problem List         PT Treatment Interventions      PT Goals (Current goals can be found in the Care Plan section)  Acute Rehab PT Goals Patient Stated Goal: return home/regain independence  PT Goal Formulation: All assessment and education complete, DC therapy    Frequency     Barriers to discharge        Co-evaluation               AM-PAC PT "6 Clicks" Daily Activity  Outcome Measure Difficulty turning over in bed (including adjusting bedclothes, sheets and blankets)?: A Little Difficulty moving from lying on back to sitting on the side of the bed? : A Little Difficulty sitting down on and standing up from a chair with arms (e.g., wheelchair, bedside commode, etc,.)?: A Little Help needed moving to and from a bed to chair (including a wheelchair)?: A Little Help needed walking in hospital room?: A Little Help needed climbing 3-5 steps with a railing? : A Little 6 Click Score: 18    End of Session Equipment Utilized During Treatment: Back brace Activity Tolerance: Patient tolerated treatment well Patient left: in bed;with call bell/phone within reach;with family/visitor present   PT Visit Diagnosis: Difficulty in walking, not elsewhere classified (R26.2)    Time: 7416-3845 PT Time Calculation (min) (ACUTE ONLY): 34 min   Charges:   PT Evaluation $PT Eval Low Complexity: 1 Low PT Treatments $Gait Training: 8-22 mins   PT G CodesMagda Bullock, [place] [phone] 08/18/2017   Loretta Bullock 08/18/2017, 3:30 PM

## 2017-08-18 NOTE — Progress Notes (Signed)
Patient ID: Loretta Bullock, female   DOB: 03/26/1961, 56 y.o.   MRN: [id number] BP 137/75 (BP Location: Right Arm)   Pulse (!) 112   Temp 98.3 F (36.8 C) (Oral)   Resp 18   Ht 5' (1.524 m)   Wt 93.4 kg (206 lb)   LMP 12/21/2012   SpO2 97%   BMI 40.23 kg/m  Alert and oriented x 4 , speech is clear and fluent Moving all extremities well Doing well, possible discharge home tomorrow.

## 2017-08-19 MED ORDER — TIZANIDINE HCL 4 MG PO TABS: 4.0000 mg | ORAL_TABLET | Freq: Four times a day (QID) | ORAL | 0 refills | Status: DC | PRN

## 2017-08-19 MED ORDER — OXYCODONE HCL 5 MG PO TABS: 5.0000 mg | ORAL_TABLET | Freq: Four times a day (QID) | ORAL | 0 refills | Status: DC | PRN

## 2017-08-19 NOTE — Discharge Instructions (Signed)

## 2017-08-19 NOTE — Plan of Care (Signed)
Baseline mobility tolerance improved

## 2017-08-19 NOTE — Care Management Note (Signed)
Case Management Note  Patient Details  Name: Loretta Bullock MRN: [id number] Date of Birth: [birth year]  Subjective/Objective:     Pt underwent:     POSTERIOR LUMBAR INTERBODY FUSION LUMBAR THREE - LUMBAR FOUR. She is from home with her spouse.               Action/Plan: No f/u per PT/OT. They did recommend a tub seat. Insurance does not cover this cost. Pt wants to purchase this outside the hospital setting.  Husband to provide supervision at home and transportation home.  No further needs per CM.  Expected Discharge Date:                  Expected Discharge Plan:  Home/Self Care  In-House Referral:     Discharge planning Services     Post Acute Care Choice:    Choice offered to:     DME Arranged:    DME Agency:     HH Arranged:    [place] Agency:     Status of Service:  Completed, signed off  If discussed at [employer] of Stay Meetings, dates discussed:    Additional Comments:  Pollie Friar, RN 08/19/2017, 2:38 PM

## 2017-08-19 NOTE — Discharge Summary (Signed)
Physician Discharge Summary  Patient ID: Loretta Bullock MRN: [id number] DOB/AGE: 10-13-1960 56 y.o.  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 I placed pedicle screws on the right side. I did removed the existing hardware on the right sideright  POSTERIOR LUMBAR INTERBODY FUSION LUMBAR THREE - LUMBAR FOUR Non segmental pedicle screw fixation(globus Creo) L3-4, right side hardware removal.  at L5, and the rod. Post op she is ambulating, voiding, and tolerating a regular diet without difficulty. Her wound is clean, dry, and without signs of infection.  Treatments: surgery: as above.   Discharge Exam: Blood pressure (!) 145/86, pulse (!) 105, temperature 98.5 F (36.9 C), temperature source Oral, resp. rate 18, height 5' (1.524 m), weight 93.4 kg (206 lb), last menstrual period 12/21/2012, SpO2 97 %. General appearance: alert, cooperative, appears stated age and mild distress  Disposition:  RADICULOPATHY, LUMBAR REGION  Allergies as of 08/19/2017   No Known Allergies     Medication List    TAKE these medications   acetaminophen 500 MG tablet Commonly known as:  TYLENOL Take 1,000 mg every 6 (six) hours as needed by mouth for moderate pain or headache.   ALAWAY OP Place 1 drop 2 (two) times daily into both eyes.   AMBULATORY NON FORMULARY MEDICATION Medication Name: CPAP  set to 7 cm of water. Please fit for new facemask with tubing. Humidifier. Diagnosis moderate obstructive sleep apnea. Please see sleep study from 01/27/2005.   AMBULATORY NON FORMULARY  MEDICATION Medication Name: Need download from CPAP machine.  Patient doesn't feel like working as well since she has gained weight. Dx. OSA.  Company: [employer]   estradiol 0.075 MG/24HR Commonly known as:  VIVELLE-DOT APPLY 1 PATCH TWICE WEEKLY   Fenofibric Acid 135 MG Cpdr TAKE 1 CAPSULE EVERY DAY What changed:    how much to take  how to take this  when to take this   fexofenadine 180 MG tablet Commonly known as:  ALLEGRA Take 180 mg by mouth daily.   gabapentin 100 MG capsule Commonly known as:  NEURONTIN TAKE 1 CAPSULE (100 MG TOTAL) BY MOUTH DAILY.   multivitamin capsule Take 1 capsule by mouth daily.   naproxen sodium 220 MG tablet Commonly known as:  ALEVE Take 220 mg 2 (two) times daily as needed by mouth (for pain or headache).   omega-3 acid ethyl esters 1 g capsule Commonly known as:  LOVAZA TAKE 1 CAPSULE (1 G TOTAL) BY MOUTH 2 (TWO) TIMES DAILY. What changed:  See the new instructions.   oxyCODONE 5 MG immediate release tablet Commonly known as:  Oxy IR/ROXICODONE Take 1 tablet (5 mg total) by mouth every 6 (six) hours as needed for moderate pain.   progesterone 200 MG capsule Commonly known as:  PROMETRIUM Take 200 mg See admin instructions by mouth. Take 200 mg by mouth daily for 2 weeks every other month   tiZANidine 4 MG tablet Commonly known as:  ZANAFLEX Take 1 tablet (4 mg total) by mouth every 6 (six) hours as needed for muscle spasms.   traMADol 50 MG tablet Commonly known as:  ULTRAM Take 1 tablet (  50 mg total) by mouth 3 (three) times daily as needed. What changed:  reasons to take this   valsartan-hydrochlorothiazide 320-25 MG tablet Commonly known as:  DIOVAN-HCT TAKE 1 TABLET BY MOUTH DAILY.   venlafaxine XR 75 MG 24 hr capsule Commonly known as:  EFFEXOR-XR Take 75 mg by mouth daily.        Signed: Mcadoo Muzquiz L 08/19/2017, 3:17 PM

## 2017-08-23 ENCOUNTER — Other Ambulatory Visit: Payer: Self-pay | Admitting: Neurosurgery

## 2017-08-23 DIAGNOSIS — M5416 Radiculopathy, lumbar region: Secondary | ICD-10-CM

## 2017-09-06 ENCOUNTER — Ambulatory Visit (INDEPENDENT_AMBULATORY_CARE_PROVIDER_SITE_OTHER): Payer: BLUE CROSS/BLUE SHIELD

## 2017-09-06 DIAGNOSIS — M5416 Radiculopathy, lumbar region: Secondary | ICD-10-CM | POA: Diagnosis not present

## 2017-10-01 ENCOUNTER — Other Ambulatory Visit: Payer: Self-pay | Admitting: Family Medicine

## 2017-10-01 DIAGNOSIS — I10 Essential (primary) hypertension: Secondary | ICD-10-CM

## 2017-10-13 ENCOUNTER — Encounter: Payer: Self-pay | Admitting: Family Medicine

## 2017-10-13 ENCOUNTER — Ambulatory Visit (INDEPENDENT_AMBULATORY_CARE_PROVIDER_SITE_OTHER): Payer: BLUE CROSS/BLUE SHIELD | Admitting: Family Medicine

## 2017-10-13 VITALS — BP 136/82 | HR 87 | Ht 60.0 in | Wt 197.0 lb

## 2017-10-13 DIAGNOSIS — E8881 Metabolic syndrome: Secondary | ICD-10-CM

## 2017-10-13 DIAGNOSIS — I10 Essential (primary) hypertension: Secondary | ICD-10-CM

## 2017-10-13 DIAGNOSIS — R748 Abnormal levels of other serum enzymes: Secondary | ICD-10-CM

## 2017-10-13 LAB — POCT GLYCOSYLATED HEMOGLOBIN (HGB A1C): Hemoglobin A1C: 6.2

## 2017-10-13 MED ORDER — VALSARTAN-HYDROCHLOROTHIAZIDE 320-25 MG PO TABS: 1.0000 | ORAL_TABLET | Freq: Every day | ORAL | 1 refills | Status: DC

## 2017-10-13 NOTE — Progress Notes (Signed)
Subjective:    CC: glucose, BP  HPI:  57 year old female is here today for routine follow-up.  She has had a posterior lumbar fusion with Dr. Ashok Pall on August 17, 2017.  Impaired fasting glucose-no increased thirst or urination. No symptoms consistent with hypoglycemia.  Hypertension- Pt denies chest pain, SOB, dizziness, or heart palpitations.  Taking meds as directed w/o problems.  Denies medication side effects.    He did have one elevated liver enzymes and we checked her labs back in May of this year.  She is due to repeat that.   Past medical history, Surgical history, Family history not pertinant except as noted below, Social history, Allergies, and medications have been entered into the medical record, reviewed, and corrections made.   Review of Systems: No fevers, chills, night sweats, weight loss, chest pain, or shortness of breath.   Objective:    General: Well Developed, well nourished, and in no acute distress.  Neuro: Alert and oriented x3, extra-ocular muscles intact, sensation grossly intact.  HEENT: Normocephalic, atraumatic  Skin: Warm and dry, no rashes. Cardiac: Regular rate and rhythm, no murmurs rubs or gallops, no lower extremity edema.  Respiratory: Clear to auscultation bilaterally. Not using accessory muscles, speaking in full sentences.   Impression and Recommendations:    HTN - Well controlled. Continue current regimen. Follow up in 6 months.   IFG - A1 C of 6.2./ Well controlled. Continue current regimen. Follow up in  51months.   Lab Results  Component Value Date   HGBA1C 6.1 04/11/2017    Elevated liver enzyme-due to repeat hepatic function.

## 2017-11-07 ENCOUNTER — Ambulatory Visit (INDEPENDENT_AMBULATORY_CARE_PROVIDER_SITE_OTHER): Payer: BLUE CROSS/BLUE SHIELD | Admitting: Physical Therapy

## 2017-11-07 DIAGNOSIS — G8929 Other chronic pain: Secondary | ICD-10-CM

## 2017-11-07 DIAGNOSIS — M5441 Lumbago with sciatica, right side: Secondary | ICD-10-CM | POA: Diagnosis not present

## 2017-11-07 DIAGNOSIS — M6281 Muscle weakness (generalized): Secondary | ICD-10-CM | POA: Diagnosis not present

## 2017-11-07 DIAGNOSIS — M62830 Muscle spasm of back: Secondary | ICD-10-CM

## 2017-11-07 NOTE — Patient Instructions (Addendum)
Quads / HF, Prone - focus on Right side     Lie face down, knees together. Grasp one ankle with same-side hand. Use towel if needed to reach. Gently pull foot toward buttock. Hold _45-60__ seconds. Repeat __2_ times per session. Do _1-2__ sessions per day.  Quads / HF, Supine - perform on both sides    Lie near edge of bed, one leg bent, foot flat on bed. Other leg hanging over edge, relaxed, thigh resting entirely on bed. Keep back pressed down.  Bend hanging knee backward keeping thigh in contact with bed. Hold _45-60__ seconds. Repeat __2_ times per session. Do _1-2__ sessions per day.   PELVIC TILT  Lie on back, legs bent. Exhale, tilting top of pelvis back, pubic bone up, to flatten lower back. Inhale, rolling pelvis opposite way, top forward, pubic bone down, arch in back. Repeat __10__ times. Do __2__ sessions per day.  Knee Drop   Keep pelvis stable. Without rotating hips, slowly drop knee to side, pause, return to center, bring knee across midline toward opposite hip. Feel obliques engaging. Repeat for ___10_ times each leg.    Use a ball for trigger point release in the buttocks and low back against a wall. Continue with heat.      Trigger Point Dry Needling  . What is Trigger Point Dry Needling (DN)? o DN is a physical therapy technique used to treat muscle pain and dysfunction. Specifically, DN helps deactivate muscle trigger points (muscle knots).  o A thin filiform needle is used to penetrate the skin and stimulate the underlying trigger point. The goal is for a local twitch response (LTR) to occur and for the trigger point to relax. No medication of any kind is injected during the procedure.   . What Does Trigger Point Dry Needling Feel Like?  o The procedure feels different for each individual patient. Some patients report that they do not actually feel the needle enter the skin and overall the process is not painful. Very mild bleeding may occur. However, many  patients feel a deep cramping in the muscle in which the needle was inserted. This is the local twitch response.   Marland Kitchen How Will I feel after the treatment? o Soreness is normal, and the onset of soreness may not occur for a few hours. Typically this soreness does not last longer than two days.  o Bruising is uncommon, however; ice can be used to decrease any possible bruising.  o In rare cases feeling tired or nauseous after the treatment is normal. In addition, your symptoms may get worse before they get better, this period will typically not last longer than 24 hours.   . What Can I do After My Treatment? o Increase your hydration by drinking more water for the next 24 hours. o You may place ice or heat on the areas treated that have become sore, however, do not use heat on inflamed or bruised areas. Heat often brings more relief post needling. o You can continue your regular activities, but vigorous activity is not recommended initially after the treatment for 24 hours. o DN is best combined with other physical therapy such as strengthening, stretching, and other therapies.

## 2017-11-07 NOTE — Therapy (Signed)
[facility] [place], [place], [address] Phone: [phone]   Fax:  [phone]  Physical Therapy Evaluation  Patient Details  Name: Loretta Bullock MRN: [id number] Date of Birth: 1961/08/18 Referring Provider: Dr Ashok Pall   Encounter Date: 11/07/2017  PT End of Session - 11/07/17 0719    Visit Number  1    Number of Visits  8    Date for PT Re-Evaluation  12/05/17    PT Start Time  0719    PT Stop Time  0824    PT Time Calculation (min)  65 min    Activity Tolerance  Patient tolerated treatment well       Past Medical History:  Diagnosis Date  . Anxiety   . DDD (degenerative disc disease), cervical   . History of lumbar laminectomy   . Hypertension   . PONV (postoperative nausea and vomiting)   . Sleep apnea    CPAP 7 cm water    Past Surgical History:  Procedure Laterality Date  . BACK SURGERY    . CESAREAN SECTION  2002  . LUMBAR DISC SURGERY  06/2013   Dr. Elliot Dally  . LUMBAR FUSION  08/17/2017  . MAXIMUM ACCESS (MAS)POSTERIOR LUMBAR INTERBODY FUSION (PLIF) 1 LEVEL  2016    There were no vitals filed for this visit.   Subjective Assessment - 11/07/17 0719    Subjective  Pt reports in 2016 she was fused L4-5 and this time L3-4 on 08/17/17. Since surgery she is having alot of scaitic pain with muscle spasms into the Rt LE. Also has a lot of arthritis in the Rt knee.  Referred to PT for strengthening    How long can you sit comfortably?  tolerates 60'    How long can you stand comfortably?  tolerates 30'    How long can you walk comfortably?  tolerates ~ 26'    Diagnostic tests  x-ray and MRI prior to surgery    Patient Stated Goals  get stronger and reduce pain    Currently in Pain?  Yes    Pain Score  3     Pain Location  Back    Pain Orientation  Right    Pain Descriptors / Indicators  Sharp;Aching    Pain Type  Surgical pain    Pain Radiating Towards  into shin    Pain  Onset  More than a month ago    Pain Frequency  Constant    Aggravating Factors   sitting    Pain Relieving Factors  stretch, straightening leg and moving around, sometimes heat         [facility] PT Assessment - 11/07/17 0001      Assessment   Medical Diagnosis  lumbar fusion    Referring Provider  Dr Ashok Pall    Onset Date/Surgical Date  08/17/18    Hand Dominance  Right    Next MD Visit  12/18/17    Prior Therapy  not after this surgery      Precautions   Precautions  Back    Precaution Booklet Issued  No    Precaution Comments  recieved in hospital      Balance Screen   Has the patient fallen in the past 6 months  No      [place] residence    Home Layout  One level      Prior Function   Level  of Independence  Independent    Vocation  Full time employment    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    Leisure  read, play with dogs, skeet shoot      Observation/Other Assessments   Focus on Therapeutic Outcomes (FOTO)   46% limited      Functional Tests   Functional tests  Single leg stance;Squat      Squat   Comments  slight shift ot Lt      Lunges   Comments  NA      Single Leg Stance   Comments  bilat WNL      Posture/Postural Control   Posture/Postural Control  Postural limitations    Postural Limitations  -- trunk shift to Rt , obesity      ROM / Strength   AROM / PROM / Strength  Strength lumbar motion NA due to recent surgery      Strength   Strength Assessment Site  Hip;Knee;Ankle;Lumbar    Right/Left Hip  Right Lt WNL except abduction 4+/5    Right Hip Flexion  4+/5    Right Hip Extension  5/5    Right Hip ABduction  4+/5    Right/Left Knee  Right Lt WNL    Right Knee Flexion  -- 5-/5    Right Knee Extension  4+/5    Right/Left Ankle  Right    Right Ankle Dorsiflexion  -- 5-/5    Right Ankle Plantar Flexion  4/5    Right Ankle Inversion  5/5    Right Ankle Eversion  5/5    Lumbar  Flexion  -- TA - Lt fair, Rt fair (-)       Flexibility   Soft Tissue Assessment /Muscle Length  yes    Hamstrings  supine SLR WNL bilat    Quadriceps  prone knee flex Rt 118, Lt 135      Palpation   Palpation comment  tight and tender in Rt gluts, pirformis, QL and lumbar paraspinals.              Objective measurements completed on examination: See above findings.      [facility] PT Treatment/Exercise - 11/07/17 0001      Therapeutic Activites    Therapeutic Activities  Other Therapeutic Activities    Other Therapeutic Activities  self trigger point release with ball on wall into Rt gluts/piriformis and low back.      Exercises   Exercises  Lumbar      Lumbar Exercises: Stretches   Hip Flexor Stretch  Left;Right;2 reps;60 seconds    Quad Stretch  Right;2 reps;60 seconds      Lumbar Exercises: Supine   Pelvic Tilt  10 reps    Clam  10 reps;Other (comment)      Modalities   Modalities  Electrical Stimulation;Moist Heat      Moist Heat Therapy   Number Minutes Moist Heat  20 Minutes    Moist Heat Location  Lumbar Spine buttocks       Electrical Stimulation   Electrical Stimulation Location  Rt lumbar and buttocks    Electrical Stimulation Action  IFC    Electrical Stimulation Parameters  to tolerance    Electrical Stimulation Goals  Pain;Tone             PT Education - 11/07/17 0753    Education provided  Yes    Education Details  HEP     Person(s) Educated  Patient  Methods  Handout;Demonstration;Explanation    Comprehension  Verbalized understanding;Returned demonstration          PT Long Term Goals - 11/07/17 0818      PT LONG TERM GOAL #1   Title  I with HEP ( 12/05/17)     Time  4    Period  Weeks    Status  New      PT LONG TERM GOAL #2   Title  report =/> 75% reduction of low back and Rt LE pain with sitting at work ( 12/05/17)     Time  4    Period  Weeks    Status  New      PT LONG TERM GOAL #3   Title  improve Rt LE  strenth =/> 5-/5 through out ( 12/05/17)     Time  4    Period  Weeks    Status  New      PT LONG TERM GOAL #4   Title  improve FOTO =/> 40% limited ( 12/05/17)     Time  4    Period  Weeks    Status  New             Plan - 11/07/17 6387    Clinical Impression Statement  57 yo female 10wks s/p lumbar fusion. She has continued pain in her rt low back and into the LE.  She requested PT to help reduce pain and restore strength. She has core weakness along with Rt LE weakness.  There is a lot of muscular tightness in her Rt low back and buttocks.  In standing her trunk is shifting to the Rt most likely due to tight muscles.     History and Personal Factors relevant to plan of care:  previous lumbar surgeries    Clinical Presentation  Stable    Clinical Decision Making  Low    Rehab Potential  Excellent    PT Frequency  2x / week    PT Duration  4 weeks    PT Treatment/Interventions  Dry needling;Manual techniques;Moist Heat;Patient/family education;Taping;Ultrasound;Cryotherapy;Electrical Stimulation    PT Next Visit Plan  DN, manual work to low back and Rt buttocks, core and LE strengthening    Consulted and Agree with Plan of Care  Patient       Patient will benefit from skilled therapeutic intervention in order to improve the following deficits and impairments:  Pain, Postural dysfunction, Increased muscle spasms, Decreased strength  Visit Diagnosis: Chronic midline low back pain with right-sided sciatica - Plan: PT plan of care cert/re-cert  Muscle weakness (generalized) - Plan: PT plan of care cert/re-cert  Muscle spasm of back - Plan: PT plan of care cert/re-cert     Problem List Patient Active Problem List   Diagnosis Date Noted  . Stenosis of lateral recess of lumbar spine 08/17/2017  . Primary osteoarthritis of right knee 03/04/2017  . Subclinical hypothyroidism 05/01/2014  . Lumbar degenerative disc disease 01/11/2013  . Obese 11/15/2011  . Sleep apnea  01/20/2011  . MOOD DISORDER 11/16/2010  . Insulin resistance 11/16/2010  . Hyperlipidemia 09/01/2010  . HYPERTENSION, BENIGN 09/01/2010    Jeral Pinch PT 11/07/2017, 8:22 AM  [facility] [place], [place], [address] Phone: [phone]   Fax:  [phone]  Name: Loretta Bullock MRN: [id number] Date of Birth: 07-21-1961

## 2017-11-08 ENCOUNTER — Ambulatory Visit (INDEPENDENT_AMBULATORY_CARE_PROVIDER_SITE_OTHER): Payer: BLUE CROSS/BLUE SHIELD | Admitting: Physical Therapy

## 2017-11-08 DIAGNOSIS — M6281 Muscle weakness (generalized): Secondary | ICD-10-CM | POA: Diagnosis not present

## 2017-11-08 DIAGNOSIS — M62830 Muscle spasm of back: Secondary | ICD-10-CM | POA: Diagnosis not present

## 2017-11-08 DIAGNOSIS — M5441 Lumbago with sciatica, right side: Secondary | ICD-10-CM | POA: Diagnosis not present

## 2017-11-08 DIAGNOSIS — G8929 Other chronic pain: Secondary | ICD-10-CM | POA: Diagnosis not present

## 2017-11-08 NOTE — Therapy (Signed)
[place], [place], [address] Phone: [phone]   Fax:  [phone]  Physical Therapy Treatment  Patient Details  Name: Loretta Bullock MRN: [id number] Date of Birth: 09/07/1961 Referring Provider: Dr Ashok Pall   Encounter Date: 11/08/2017  PT End of Session - 11/08/17 1156    Visit Number  2    Number of Visits  8    Date for PT Re-Evaluation  12/05/17    PT Start Time  2595    PT Stop Time  1250    PT Time Calculation (min)  52 min    Activity Tolerance  Patient tolerated treatment well       Past Medical History:  Diagnosis Date  . Anxiety   . DDD (degenerative disc disease), cervical   . History of lumbar laminectomy   . Hypertension   . PONV (postoperative nausea and vomiting)   . Sleep apnea    CPAP 7 cm water    Past Surgical History:  Procedure Laterality Date  . BACK SURGERY    . CESAREAN SECTION  2002  . LUMBAR DISC SURGERY  06/2013   Dr. Elliot Dally  . LUMBAR FUSION  08/17/2017  . MAXIMUM ACCESS (MAS)POSTERIOR LUMBAR INTERBODY FUSION (PLIF) 1 LEVEL  2016    There were no vitals filed for this visit.  Subjective Assessment - 11/08/17 1156    Subjective  Pt reports she felt better last night, the pain is coming back  a little today.     Currently in Pain?  Yes    Pain Score  2     Pain Location  Back    Pain Orientation  Right    Pain Descriptors / Indicators  Aching    Pain Type  Surgical pain    Pain Onset  More than a month ago    Pain Frequency  Constant                      [facility] Adult PT Treatment/Exercise - 11/08/17 0001      Lumbar Exercises: Stretches   Single Knee to Chest Stretch  Left;Right;30 seconds    Lower Trunk Rotation  2 reps;30 seconds each side    Hip Flexor Stretch  Left;Right;60 seconds    Quad Stretch  Left;Right;60 seconds      Modalities   Modalities  Electrical Stimulation;Moist Heat      Moist Heat Therapy   Number Minutes Moist Heat  20 Minutes    Moist Heat Location  Lumbar Spine Rt buttock      Electrical Stimulation   Electrical Stimulation Location  Rt lumbar and buttocks    Electrical Stimulation Action  IFC    Electrical Stimulation Parameters  to tolerance    Electrical Stimulation Goals  Pain;Tone      Manual Therapy   Manual Therapy  Soft tissue mobilization    Soft tissue mobilization  STM to Rt gluts/piriformis and low back, she was ticklish into the Rt QL area       Trigger Point Dry Needling - 11/08/17 1213    Consent Given?  Yes    Education Handout Provided  Yes    Muscles Treated Lower Body  Piriformis;Gluteus maximus;Gluteus minimus Rt side    Gluteus Maximus Response  Palpable increased muscle length;Twitch response elicited with stim rt side    Gluteus Minimus Response  Palpable increased muscle length;Twitch response elicited    Piriformis Response  Palpable  increased muscle length;Twitch response elicited           PT Education - 11/07/17 0753    Education provided  Yes    Education Details  HEP     Person(s) Educated  Patient    Methods  Handout;Demonstration;Explanation    Comprehension  Verbalized understanding;Returned demonstration          PT Long Term Goals - 11/07/17 0818      PT LONG TERM GOAL #1   Title  I with HEP ( 12/05/17)     Time  4    Period  Weeks    Status  New      PT LONG TERM GOAL #2   Title  report =/> 75% reduction of low back and Rt LE pain with sitting at work ( 12/05/17)     Time  4    Period  Weeks    Status  New      PT LONG TERM GOAL #3   Title  improve Rt LE strenth =/> 5-/5 through out ( 12/05/17)     Time  4    Period  Weeks    Status  New      PT LONG TERM GOAL #4   Title  improve FOTO =/> 40% limited ( 12/05/17)     Time  4    Period  Weeks    Status  New            Plan - 11/08/17 1236    Clinical Impression Statement  Claiborne Billings returned for her second visit, she tolerated manual work and DN well  with almost no pain after treatment.     Rehab Potential  Excellent    PT Frequency  2x / week    PT Duration  4 weeks    PT Treatment/Interventions  Dry needling;Manual techniques;Moist Heat;Patient/family education;Taping;Ultrasound;Cryotherapy;Electrical Stimulation    PT Next Visit Plan  DN, manual work to low back and Rt buttocks, core and LE strengthening    Consulted and Agree with Plan of Care  Patient       Patient will benefit from skilled therapeutic intervention in order to improve the following deficits and impairments:  Pain, Postural dysfunction, Increased muscle spasms, Decreased strength  Visit Diagnosis: Chronic midline low back pain with right-sided sciatica  Muscle weakness (generalized)  Muscle spasm of back     Problem List Patient Active Problem List   Diagnosis Date Noted  . Stenosis of lateral recess of lumbar spine 08/17/2017  . Primary osteoarthritis of right knee 03/04/2017  . Subclinical hypothyroidism 05/01/2014  . Lumbar degenerative disc disease 01/11/2013  . Obese 11/15/2011  . Sleep apnea 01/20/2011  . MOOD DISORDER 11/16/2010  . Insulin resistance 11/16/2010  . Hyperlipidemia 09/01/2010  . HYPERTENSION, BENIGN 09/01/2010    Jeral Pinch PT  11/08/2017, 12:39 PM  [facility] [place], [place], [address] Phone: [phone]   Fax:  [phone]  Name: Citlalic Norlander MRN: [id number] Date of Birth: 26-Mar-1961

## 2017-11-09 ENCOUNTER — Encounter: Payer: BLUE CROSS/BLUE SHIELD | Admitting: Physical Therapy

## 2017-11-14 ENCOUNTER — Encounter: Payer: BLUE CROSS/BLUE SHIELD | Admitting: Rehabilitative and Restorative Service Providers"

## 2017-11-16 ENCOUNTER — Encounter: Payer: Self-pay | Admitting: Physical Therapy

## 2017-11-16 ENCOUNTER — Ambulatory Visit: Payer: BLUE CROSS/BLUE SHIELD | Admitting: Physical Therapy

## 2017-11-16 DIAGNOSIS — M6281 Muscle weakness (generalized): Secondary | ICD-10-CM

## 2017-11-16 DIAGNOSIS — M62830 Muscle spasm of back: Secondary | ICD-10-CM

## 2017-11-16 DIAGNOSIS — G8929 Other chronic pain: Secondary | ICD-10-CM

## 2017-11-16 DIAGNOSIS — M5441 Lumbago with sciatica, right side: Secondary | ICD-10-CM | POA: Diagnosis not present

## 2017-11-16 NOTE — Patient Instructions (Addendum)
Toe Touch    Lie on back, legs folded to chest, arms by sides. Exhale, lowering leg to just touch toes to mat. Inhale, returning knee to chest. Keep abdominals flat, navel to spine. Repeat _10-20___ times, alternating legs. Do __every other day.   Single Leg Circle    Lie on back, one leg bent, other leg straight up. Inhale, circling leg across body, and exhale while circling down and around to beginning. Maintain still pelvis; avoid rocking. Keep circle small. Repeat _10-20___ times clockwise, then counterclockwise. Repeat with other leg. Do __every other day.  Leg Lift: One-Leg    Press pelvis down. Keep knee straight; lengthen and lift one leg (from waist). Do not twist body. Keep other leg down. Hold _1__ seconds. Relax. Repeat 10-20 time. Repeat with other leg. Perform every other day.   Shoulder Blade Squeeze: Fingers Interlaced    Fingers interlaced behind your body, palms facing each other. Press pelvis down. Squeeze backbone with shoulder blades. Raise front of shoulders, chest, and head. Keep neck neutral. Hold __1_ seconds. Relax. Repeat _10-20__ times. Perform every other day.

## 2017-11-16 NOTE — Therapy (Signed)
[place], [place], [address] Phone: [phone]   Fax:  [phone]  Physical Therapy Treatment  Patient Details  Name: Loretta Bullock MRN: [id number] Date of Birth: July 02, 1961 Referring Provider: Dr Ashok Pall   Encounter Date: 11/16/2017  PT End of Session - 11/16/17 0719    Visit Number  3    Number of Visits  8    Date for PT Re-Evaluation  12/05/17    PT Start Time  0720    PT Stop Time  0819    PT Time Calculation (min)  59 min    Activity Tolerance  Patient tolerated treatment well       Past Medical History:  Diagnosis Date  . Anxiety   . DDD (degenerative disc disease), cervical   . History of lumbar laminectomy   . Hypertension   . PONV (postoperative nausea and vomiting)   . Sleep apnea    CPAP 7 cm water    Past Surgical History:  Procedure Laterality Date  . BACK SURGERY    . CESAREAN SECTION  2002  . LUMBAR DISC SURGERY  06/2013   Dr. Elliot Dally  . LUMBAR FUSION  08/17/2017  . MAXIMUM ACCESS (MAS)POSTERIOR LUMBAR INTERBODY FUSION (PLIF) 1 LEVEL  2016    There were no vitals filed for this visit.  Subjective Assessment - 11/16/17 0720    Subjective  Loretta Bullock reports she has been doing pretty good, the tennis ball for TPR is helping a lot. Got her stand up desk yesterday.     Currently in Pain?  Yes    Pain Score  2     Pain Location  Hip    Pain Orientation  Right    Pain Descriptors / Indicators  Dull    Pain Type  Surgical pain    Aggravating Factors   sitting    Pain Relieving Factors  stretching and TPR         [facility] PT Assessment - 11/16/17 0001      Assessment   Medical Diagnosis  lumbar fusion    Referring Provider  Dr Ashok Pall      Strength   Right/Left Hip  Right    Right Hip Flexion  -- 5-/5    Right Hip Extension  5/5    Right Hip ABduction  5/5                  [facility] Adult PT Treatment/Exercise - 11/16/17 0001      Lumbar Exercises: Stretches   Passive Hamstring Stretch  Left;Right;2 reps;20 seconds with strap    Hip Flexor Stretch  Left;Right;2 reps;60 seconds off EOB with strap to get quad.     ITB Stretch  Left;Right;2 reps;30 seconds cross body with strap    Other Lumbar Stretch Exercise  10 cat/cow, then childs pose      Lumbar Exercises: Supine   Other Supine Lumbar Exercises  10 reps table top with heel taps, CW/CCW leg circles      Lumbar Exercises: Prone   Straight Leg Raise  10 reps with pelvic press    Other Prone Lumbar Exercises  10 reps upper body lifts      Modalities   Modalities  Electrical Stimulation;Moist Heat      Moist Heat Therapy   Number Minutes Moist Heat  20 Minutes    Moist Heat Location  --  buttocks      Electrical Stimulation  Electrical Stimulation Location  Rt buttock    Electrical Stimulation Action  IFC    Electrical Stimulation Parameters  to tolerance    Electrical Stimulation Goals  Pain;Tone      Manual Therapy   Manual Therapy  Soft tissue mobilization    Soft tissue mobilization  STm to Rt gluts and piriformis       Trigger Point Dry Needling - 11/16/17 0743    Consent Given?  Yes    Education Handout Provided  No    Muscles Treated Lower Body  Gluteus maximus;Piriformis Rt    Gluteus Maximus Response  Palpable increased muscle length;Twitch response elicited    Piriformis Response  Palpable increased muscle length;Twitch response elicited                PT Long Term Goals - 11/16/17 0732      PT LONG TERM GOAL #1   Title  I with HEP ( 12/05/17)     Status  On-going      PT LONG TERM GOAL #2   Title  report =/> 75% reduction of low back and Rt LE pain with sitting at work ( 12/05/17)     Status  On-going 405 improvement      PT LONG TERM GOAL #3   Title  improve Rt LE strenth =/> 5-/5 through out ( 12/05/17)     Status  Achieved      PT LONG TERM GOAL #4   Title  improve FOTO =/> 40% limited ( 12/05/17)     Status  On-going             Plan - 11/16/17 0736    Clinical Impression Statement  Loretta Bullock reports about a 405 reduction in overall back and Rt  LE pain, she has met her LE strength goal.  She is very weak through her core and exercises have been added to begin strengthening here. She is having less palpable tightness in her rt buttocks.  Progressing well with therapy.  Will benifit from continued therapy to increase core stabilization and reduce hip/back tightness.     Rehab Potential  Excellent    PT Frequency  2x / week    PT Duration  4 weeks    PT Treatment/Interventions  Dry needling;Manual techniques;Moist Heat;Patient/family education;Taping;Ultrasound;Cryotherapy;Electrical Stimulation    PT Next Visit Plan  progress core work in clinic     Consulted and Agree with Plan of Care  Patient       Patient will benefit from skilled therapeutic intervention in order to improve the following deficits and impairments:  Pain, Postural dysfunction, Increased muscle spasms, Decreased strength  Visit Diagnosis: Chronic midline low back pain with right-sided sciatica  Muscle weakness (generalized)  Muscle spasm of back     Problem List Patient Active Problem List   Diagnosis Date Noted  . Stenosis of lateral recess of lumbar spine 08/17/2017  . Primary osteoarthritis of right knee 03/04/2017  . Subclinical hypothyroidism 05/01/2014  . Lumbar degenerative disc disease 01/11/2013  . Obese 11/15/2011  . Sleep apnea 01/20/2011  . MOOD DISORDER 11/16/2010  . Insulin resistance 11/16/2010  . Hyperlipidemia 09/01/2010  . HYPERTENSION, BENIGN 09/01/2010    Jeral Pinch PT 11/16/2017, 8:01 AM  [facility] [place], [place], [address] Phone: [phone]   Fax:  [phone]  Name: Loretta Bullock MRN: [id number] Date of Birth: 08-02-1961

## 2017-11-18 ENCOUNTER — Encounter: Payer: Self-pay | Admitting: Physical Therapy

## 2017-11-18 ENCOUNTER — Ambulatory Visit (INDEPENDENT_AMBULATORY_CARE_PROVIDER_SITE_OTHER): Payer: BLUE CROSS/BLUE SHIELD | Admitting: Physical Therapy

## 2017-11-18 DIAGNOSIS — M5441 Lumbago with sciatica, right side: Secondary | ICD-10-CM | POA: Diagnosis not present

## 2017-11-18 DIAGNOSIS — M6281 Muscle weakness (generalized): Secondary | ICD-10-CM

## 2017-11-18 DIAGNOSIS — G8929 Other chronic pain: Secondary | ICD-10-CM | POA: Diagnosis not present

## 2017-11-18 DIAGNOSIS — M62830 Muscle spasm of back: Secondary | ICD-10-CM | POA: Diagnosis not present

## 2017-11-18 NOTE — Therapy (Signed)
[place], [place], [address] Phone: [phone]   Fax:  [phone]  Physical Therapy Treatment  Patient Details  Name: Loretta Bullock MRN: [id number] Date of Birth: Jan 08, 1961 Referring Provider: Dr Ashok Pall   Encounter Date: 11/18/2017  PT End of Session - 11/18/17 0847    Visit Number  4    Number of Visits  8    Date for PT Re-Evaluation  12/05/17    PT Start Time  6433    PT Stop Time  0947    PT Time Calculation (min)  60 min    Activity Tolerance  Patient tolerated treatment well;No increased pain    Behavior During Therapy  WFL for tasks assessed/performed       Past Medical History:  Diagnosis Date  . Anxiety   . DDD (degenerative disc disease), cervical   . History of lumbar laminectomy   . Hypertension   . PONV (postoperative nausea and vomiting)   . Sleep apnea    CPAP 7 cm water    Past Surgical History:  Procedure Laterality Date  . BACK SURGERY    . CESAREAN SECTION  2002  . LUMBAR DISC SURGERY  06/2013   Dr. Elliot Dally  . LUMBAR FUSION  08/17/2017  . MAXIMUM ACCESS (MAS)POSTERIOR LUMBAR INTERBODY FUSION (PLIF) 1 LEVEL  2016    There were no vitals filed for this visit.  Subjective Assessment - 11/18/17 0851    Subjective  Pt reports things are little worse today, not sure why.  She has been using standing desk since [place].     Currently in Pain?  Yes    Pain Score  5     Pain Location  Buttocks    Pain Orientation  Right    Pain Descriptors / Indicators  Dull    Aggravating Factors   sitting or standing for long periods    Pain Relieving Factors  stretching, tennis ball massage.        [facility] Adult PT Treatment/Exercise - 11/18/17 0001      Self-Care   Self-Care  Other Self-Care Comments    Other Self-Care Comments   pt educated on TENS unit parameters, safety and application.  Pt verbalized understanding.        Lumbar Exercises: Stretches    Passive Hamstring Stretch  --    Sports administrator  Right;Left;2 reps;60 seconds prone with strap    Piriformis Stretch  Right;Left;2 reps;30 seconds      Lumbar Exercises: Aerobic   Nustep  L4: 3 min       Lumbar Exercises: Supine   Ab Set  5 reps;5 seconds tactile cues for engagement.     Clam  5 reps with ab set; tactile cues for form.     Bent Knee Raise  10 reps with ab set    Bridge  5 reps      Lumbar Exercises: Prone   Straight Leg Raise  10 reps with pelvic press    Opposite Arm/Leg Raise  Right arm/Left leg;Left arm/Right leg;5 reps      Modalities   Modalities  Electrical Stimulation;Moist Heat      Moist Heat Therapy   Number Minutes Moist Heat  15 Minutes    Moist Heat Location  --  buttocks      Electrical Stimulation   Electrical Stimulation Location  Rt buttocks/ thigh    Electrical Stimulation Action  TENS    Electrical Stimulation  Parameters  to tolerance     Electrical Stimulation Goals  Pain;Tone      Manual Therapy   Soft tissue mobilization  STm to Rt gluts and piriformis             PT Education - 11/18/17 0913    Education provided  Yes    Education Details  HEP     Person(s) Educated  Patient    Methods  Explanation;Demonstration;Tactile cues;Verbal cues;Handout    Comprehension  Verbalized understanding;Returned demonstration          PT Long Term Goals - 11/18/17 1004      PT LONG TERM GOAL #1   Title  I with HEP ( 12/05/17)     Time  4    Period  Weeks    Status  On-going      PT LONG TERM GOAL #2   Title  report =/> 75% reduction of low back and Rt LE pain with sitting at work ( 12/05/17)     Time  4    Period  Weeks    Status  On-going 40% reduction      PT LONG TERM GOAL #3   Title  improve Rt LE strenth =/> 5-/5 through out ( 12/05/17)     Time  4    Period  Weeks    Status  Achieved      PT LONG TERM GOAL #4   Title  improve FOTO =/> 40% limited ( 12/05/17)     Time  4    Period  Weeks    Status  On-going             Plan - 11/18/17 0947    Clinical Impression Statement  Pt reported reduction of Rt hip pain with exercise.  She tolerated all exercises well except upper body lifts; modified HEP to include opp arm and leg instead. She reported significant reduction in pain at end of session. Progressing well towards established goals.     Rehab Potential  Excellent    PT Frequency  2x / week    PT Duration  4 weeks    PT Treatment/Interventions  Dry needling;Manual techniques;Moist Heat;Patient/family education;Taping;Ultrasound;Cryotherapy;Electrical Stimulation    PT Next Visit Plan  progress lumbar stabilization and core    Consulted and Agree with Plan of Care  Patient       Patient will benefit from skilled therapeutic intervention in order to improve the following deficits and impairments:  Pain, Postural dysfunction, Increased muscle spasms, Decreased strength  Visit Diagnosis: Chronic midline low back pain with right-sided sciatica  Muscle weakness (generalized)  Muscle spasm of back     Problem List Patient Active Problem List   Diagnosis Date Noted  . Stenosis of lateral recess of lumbar spine 08/17/2017  . Primary osteoarthritis of right knee 03/04/2017  . Subclinical hypothyroidism 05/01/2014  . Lumbar degenerative disc disease 01/11/2013  . Obese 11/15/2011  . Sleep apnea 01/20/2011  . MOOD DISORDER 11/16/2010  . Insulin resistance 11/16/2010  . Hyperlipidemia 09/01/2010  . HYPERTENSION, BENIGN 09/01/2010   Kerin Perna, PTA 11/18/17 10:06 AM  [place], [place], [address] Phone: [phone]   Fax:  [phone]  Name: Ludmila Ebarb MRN: [id number] Date of Birth: Dec 25, 1960

## 2017-11-18 NOTE — Patient Instructions (Addendum)
Arm / Leg Lift: Opposite (Prone)    Lift right leg and opposite arm __2__ inches from floor, keeping knee locked. Repeat __5-10__ times per set. Do __2__ sets per session. Do __1__ sessions per day.   Abdominal Bracing With Pelvic Floor (Hook-Lying)   With neutral spine, tighten pelvic floor and abdominals. Hold 10 seconds. Repeat __10_ times. Do _several__ times a day. Can be performed in sitting, standing.   Knee to Chest: Transverse Plane Stability   Bring one knee up, then return. Be sure pelvis does not roll side to side. Keep pelvis still. Lift knee __10_ times each leg. Restabilize pelvis. Repeat with other leg. Do _1-2__ sets, _1-2__ times per day.  Hip External Rotation With Pillow: Transverse Plane Stability   KEEP BOTH KNEES BENT.  Slowly roll bent knee out. Be sure pelvis does not rotate. Do _10__ times. Restabilize pelvis. Repeat with other leg. Do _1-2__ sets, _1__ times per day. Therapeutic - Bridging    Tighten abdominals, Lift buttocks, keeping back straight and arms on floor. Hold _1-5___ seconds. Repeat __10__ times.  Piriformis Stretch   Lying on back, pull right knee toward opposite shoulder. Hold 30 seconds. Repeat 3 times. Do 2-3 sessions per day.    [facility] at [facility] [place], [place] [address]  [phone] (office) [phone] (fax)

## 2017-11-21 ENCOUNTER — Encounter: Payer: Self-pay | Admitting: Physical Therapy

## 2017-11-21 ENCOUNTER — Ambulatory Visit (INDEPENDENT_AMBULATORY_CARE_PROVIDER_SITE_OTHER): Payer: BLUE CROSS/BLUE SHIELD | Admitting: Physical Therapy

## 2017-11-21 DIAGNOSIS — G8929 Other chronic pain: Secondary | ICD-10-CM | POA: Diagnosis not present

## 2017-11-21 DIAGNOSIS — M62830 Muscle spasm of back: Secondary | ICD-10-CM

## 2017-11-21 DIAGNOSIS — M5441 Lumbago with sciatica, right side: Secondary | ICD-10-CM | POA: Diagnosis not present

## 2017-11-21 DIAGNOSIS — M6281 Muscle weakness (generalized): Secondary | ICD-10-CM

## 2017-11-21 NOTE — Therapy (Signed)
[place], [place], [address] Phone: [phone]   Fax:  [phone]  Physical Therapy Treatment  Patient Details  Name: Loretta Bullock MRN: [id number] Date of Birth: Nov 13, 1960 Referring Provider: Dr Ashok Pall   Encounter Date: 11/21/2017  PT End of Session - 11/21/17 0721    Visit Number  5    Number of Visits  8    Date for PT Re-Evaluation  12/05/17    Authorization - Visit Number  8    PT Start Time  0721    PT Stop Time  0813    PT Time Calculation (min)  52 min    Activity Tolerance  Patient tolerated treatment well       Past Medical History:  Diagnosis Date  . Anxiety   . DDD (degenerative disc disease), cervical   . History of lumbar laminectomy   . Hypertension   . PONV (postoperative nausea and vomiting)   . Sleep apnea    CPAP 7 cm water    Past Surgical History:  Procedure Laterality Date  . BACK SURGERY    . CESAREAN SECTION  2002  . LUMBAR DISC SURGERY  06/2013   Dr. Elliot Dally  . LUMBAR FUSION  08/17/2017  . MAXIMUM ACCESS (MAS)POSTERIOR LUMBAR INTERBODY FUSION (PLIF) 1 LEVEL  2016    There were no vitals filed for this visit.  Subjective Assessment - 11/21/17 0722    Subjective  Pt reports that yesterday was a bad pain day and she is still sore today.     Patient Stated Goals  get stronger and reduce pain    Currently in Pain?  Yes    Pain Score  6     Pain Location  Buttocks    Pain Orientation  Right    Pain Descriptors / Indicators  Sharp in buttocks    Pain Type  Surgical pain    Pain Radiating Towards  to knee    Pain Onset  More than a month ago    Pain Frequency  Constant    Aggravating Factors   prolonged sitting and standing, turning the wrong way    Pain Relieving Factors  TPR with tennis ball.          [facility] PT Assessment - 11/21/17 0001      Assessment   Medical Diagnosis  lumbar fusion    Referring Provider  Dr Ashok Pall    Onset Date/Surgical Date  08/17/18                  [facility] Adult PT Treatment/Exercise - 11/21/17 0001      Therapeutic Activites    Other Therapeutic Activities  self trigger point release with ball on wall into Rt gluts/piriformis and low back.      Lumbar Exercises: Aerobic   Nustep  L5x5'      Lumbar Exercises: Standing   Heel Raises  20 reps no HHA required    Functional Squats  20 reps sit to/from stand with TA contraction, straight trunk      Lumbar Exercises: Supine   Isometric Hip Flexion  10 reps;5 seconds same side and opposite, VC for form      Modalities   Modalities  Electrical Stimulation;Moist Heat      Moist Heat Therapy   Number Minutes Moist Heat  15 Minutes    Moist Heat Location  -- buttocks      Electrical Stimulation  Electrical Stimulation Location  Rt buttocks    Electrical Stimulation Action  TENS    Electrical Stimulation Parameters  to tolerance    Electrical Stimulation Goals  Pain;Tone      Manual Therapy   Manual Therapy  Soft tissue mobilization    Soft tissue mobilization  STm to Rt gluts and piriformis       Trigger Point Dry Needling - 11/21/17 0740    Consent Given?  Yes    Education Handout Provided  No    Muscles Treated Lower Body  Gluteus maximus;Piriformis rt side    Gluteus Maximus Response  Palpable increased muscle length;Twitch response elicited    Gluteus Minimus Response  Palpable increased muscle length;Twitch response elicited    Piriformis Response  Palpable increased muscle length;Twitch response elicited                PT Long Term Goals - 11/21/17 0725      PT LONG TERM GOAL #1   Title  I with HEP ( 12/05/17)     Status  On-going      PT LONG TERM GOAL #2   Title  report =/> 75% reduction of low back and Rt LE pain with sitting at work ( 12/05/17)     Status  On-going w/e was not as good       PT LONG TERM GOAL #3   Title  improve Rt LE strenth =/> 5-/5 through out ( 12/05/17)     Status   Achieved      PT LONG TERM GOAL #4   Title  improve FOTO =/> 40% limited ( 12/05/17)     Status  On-going            Plan - 11/21/17 0800    Clinical Impression Statement  Loretta Bullock is a little more sore today, not sure what she did. No new goals met, she is able to contract her TA easier than last week and had some relief wtih transfers when cued to engage her core. Also had increased flexibility in her Rt hip after manual work.  She did have a lot of tightness in the Rt gluts   Rehab Potential  Excellent    PT Frequency  2x / week    PT Duration  4 weeks    PT Treatment/Interventions  Dry needling;Manual techniques;Moist Heat;Patient/family education;Taping;Ultrasound;Cryotherapy;Electrical Stimulation    PT Next Visit Plan  progress lumbar stabilization and core    Consulted and Agree with Plan of Care  Patient       Patient will benefit from skilled therapeutic intervention in order to improve the following deficits and impairments:  Pain, Postural dysfunction, Increased muscle spasms, Decreased strength  Visit Diagnosis: Chronic midline low back pain with right-sided sciatica  Muscle weakness (generalized)  Muscle spasm of back     Problem List Patient Active Problem List   Diagnosis Date Noted  . Stenosis of lateral recess of lumbar spine 08/17/2017  . Primary osteoarthritis of right knee 03/04/2017  . Subclinical hypothyroidism 05/01/2014  . Lumbar degenerative disc disease 01/11/2013  . Obese 11/15/2011  . Sleep apnea 01/20/2011  . MOOD DISORDER 11/16/2010  . Insulin resistance 11/16/2010  . Hyperlipidemia 09/01/2010  . HYPERTENSION, BENIGN 09/01/2010    Jeral Pinch PT  11/21/2017, 8:01 AM  [facility] [place], [place], [address] Phone: [phone]   Fax:  [phone]  Name: Loretta Bullock MRN: [id number] Date of Birth: 1960-12-05

## 2017-11-23 ENCOUNTER — Encounter: Payer: Self-pay | Admitting: Physical Therapy

## 2017-11-23 ENCOUNTER — Ambulatory Visit (INDEPENDENT_AMBULATORY_CARE_PROVIDER_SITE_OTHER): Payer: BLUE CROSS/BLUE SHIELD | Admitting: Physical Therapy

## 2017-11-23 DIAGNOSIS — M6281 Muscle weakness (generalized): Secondary | ICD-10-CM | POA: Diagnosis not present

## 2017-11-23 DIAGNOSIS — M5441 Lumbago with sciatica, right side: Secondary | ICD-10-CM | POA: Diagnosis not present

## 2017-11-23 DIAGNOSIS — G8929 Other chronic pain: Secondary | ICD-10-CM | POA: Diagnosis not present

## 2017-11-23 DIAGNOSIS — M62830 Muscle spasm of back: Secondary | ICD-10-CM | POA: Diagnosis not present

## 2017-11-23 NOTE — Therapy (Signed)
[place], [place], [address] Phone: [phone]   Fax:  [phone]  Physical Therapy Treatment  Patient Details  Name: Loretta Bullock MRN: [id number] Date of Birth: June 05, 1961 Referring Provider: Dr Ashok Pall   Encounter Date: 11/23/2017  PT End of Session - 11/23/17 0719    Visit Number  6    Number of Visits  8    Date for PT Re-Evaluation  12/05/17    Authorization - Visit Number  8    PT Start Time  0720    PT Stop Time  0809    PT Time Calculation (min)  49 min    Activity Tolerance  Patient tolerated treatment well       Past Medical History:  Diagnosis Date  . Anxiety   . DDD (degenerative disc disease), cervical   . History of lumbar laminectomy   . Hypertension   . PONV (postoperative nausea and vomiting)   . Sleep apnea    CPAP 7 cm water    Past Surgical History:  Procedure Laterality Date  . BACK SURGERY    . CESAREAN SECTION  2002  . LUMBAR DISC SURGERY  06/2013   Dr. Elliot Dally  . LUMBAR FUSION  08/17/2017  . MAXIMUM ACCESS (MAS)POSTERIOR LUMBAR INTERBODY FUSION (PLIF) 1 LEVEL  2016    There were no vitals filed for this visit.  Subjective Assessment - 11/23/17 0720    Subjective  Loretta Bullock report she was good monday after treatment, yesterday had a fair amount of pain.  She was very sore after her exercise for a couple hrs. It felt llike muscle soreness.  Her stand up desk does help.     Patient Stated Goals  get stronger and reduce pain    Currently in Pain?  Yes    Pain Score  4     Pain Location  Back    Pain Orientation  Right    Pain Descriptors / Indicators  Aching    Pain Type  Surgical pain    Pain Onset  More than a month ago    Pain Frequency  Constant                      [facility] Adult PT Treatment/Exercise - 11/23/17 0001      Lumbar Exercises: Stretches   Active Hamstring Stretch  Right;30 seconds      Lumbar Exercises:  Aerobic   Nustep  L5x5'      Lumbar Exercises: Supine   Other Supine Lumbar Exercises  modified pilates 100's - upper body, VC for form    Other Supine Lumbar Exercises  pilates table top with knee extension      Lumbar Exercises: Prone   Other Prone Lumbar Exercises  10 lower body lifts -pt reported some tighness pain/cramping in Rt hamstring, 10 reps upper body lifts      Lumbar Exercises: Quadruped   Straight Leg Raise  20 reps verbal and physical cues to keep pelvis level    Other Quadruped Lumbar Exercises  with knee lifts for TA contaction      Modalities   Modalities  Electrical Stimulation;Moist Heat      Moist Heat Therapy   Number Minutes Moist Heat  15 Minutes    Moist Heat Location  -- buttocks      Electrical Stimulation   Electrical Stimulation Location  Rt buttocks    Electrical Stimulation Action  IFC  Electrical Stimulation Parameters  to tolerance    Electrical Stimulation Goals  Pain;Tone                  PT Long Term Goals - 11/21/17 0725      PT LONG TERM GOAL #1   Title  I with HEP ( 12/05/17)     Status  On-going      PT LONG TERM GOAL #2   Title  report =/> 75% reduction of low back and Rt LE pain with sitting at work ( 12/05/17)     Status  On-going w/e was not as good       PT LONG TERM GOAL #3   Title  improve Rt LE strenth =/> 5-/5 through out ( 12/05/17)     Status  Achieved      PT LONG TERM GOAL #4   Title  improve FOTO =/> 40% limited ( 12/05/17)     Status  On-going            Plan - 11/23/17 0757    Clinical Impression Statement  Loretta Bullock tolerated strengthening well today without any increased pain.  Once she had cues to perform ex she was able to maintain the form.     Rehab Potential  Excellent    PT Frequency  2x / week    PT Duration  4 weeks    PT Treatment/Interventions  Dry needling;Manual techniques;Moist Heat;Patient/family education;Taping;Ultrasound;Cryotherapy;Electrical Stimulation    PT Next Visit Plan   progress lumbar stabilization and core, next week isher last week of therapy, issue picture of exercise progressions for core     Consulted and Agree with Plan of Care  Patient       Patient will benefit from skilled therapeutic intervention in order to improve the following deficits and impairments:  Pain, Postural dysfunction, Increased muscle spasms, Decreased strength  Visit Diagnosis: Chronic midline low back pain with right-sided sciatica  Muscle weakness (generalized)  Muscle spasm of back     Problem List Patient Active Problem List   Diagnosis Date Noted  . Stenosis of lateral recess of lumbar spine 08/17/2017  . Primary osteoarthritis of right knee 03/04/2017  . Subclinical hypothyroidism 05/01/2014  . Lumbar degenerative disc disease 01/11/2013  . Obese 11/15/2011  . Sleep apnea 01/20/2011  . MOOD DISORDER 11/16/2010  . Insulin resistance 11/16/2010  . Hyperlipidemia 09/01/2010  . HYPERTENSION, BENIGN 09/01/2010    Jeral Pinch PT  11/23/2017, 7:59 AM  [facility] [place], [place], [address] Phone: [phone]   Fax:  [phone]  Name: Loretta Bullock MRN: [id number] Date of Birth: 30-Dec-1960

## 2017-11-28 ENCOUNTER — Encounter: Payer: Self-pay | Admitting: Physical Therapy

## 2017-11-28 ENCOUNTER — Ambulatory Visit (INDEPENDENT_AMBULATORY_CARE_PROVIDER_SITE_OTHER): Payer: BLUE CROSS/BLUE SHIELD | Admitting: Physical Therapy

## 2017-11-28 DIAGNOSIS — M5441 Lumbago with sciatica, right side: Secondary | ICD-10-CM | POA: Diagnosis not present

## 2017-11-28 DIAGNOSIS — M62830 Muscle spasm of back: Secondary | ICD-10-CM

## 2017-11-28 DIAGNOSIS — M6281 Muscle weakness (generalized): Secondary | ICD-10-CM

## 2017-11-28 DIAGNOSIS — G8929 Other chronic pain: Secondary | ICD-10-CM | POA: Diagnosis not present

## 2017-11-28 NOTE — Therapy (Signed)
[place], [place], [address] Phone: [phone]   Fax:  [phone]  Physical Therapy Treatment  Patient Details  Name: Loretta Bullock MRN: [id number] Date of Birth: September 28, 1960 Referring Provider: Dr. Ashok Pall    Encounter Date: 11/28/2017  PT End of Session - 11/28/17 0723    Visit Number  7    Number of Visits  8    Date for PT Re-Evaluation  12/05/17    Authorization - Visit Number  8    PT Start Time  0720    PT Stop Time  0814    PT Time Calculation (min)  54 min    Activity Tolerance  Patient tolerated treatment well;No increased pain    Behavior During Therapy  WFL for tasks assessed/performed       Past Medical History:  Diagnosis Date  . Anxiety   . DDD (degenerative disc disease), cervical   . History of lumbar laminectomy   . Hypertension   . PONV (postoperative nausea and vomiting)   . Sleep apnea    CPAP 7 cm water    Past Surgical History:  Procedure Laterality Date  . BACK SURGERY    . CESAREAN SECTION  2002  . LUMBAR DISC SURGERY  06/2013   Dr. Elliot Dally  . LUMBAR FUSION  08/17/2017  . MAXIMUM ACCESS (MAS)POSTERIOR LUMBAR INTERBODY FUSION (PLIF) 1 LEVEL  2016    There were no vitals filed for this visit.  Subjective Assessment - 11/28/17 0723    Subjective  Pt reports she used her TENS over weekend a few times and this has helped.  "I didn't even have to take any muscle relaxers".   "I'm feeling creaky this morning".     Patient Stated Goals  get stronger and reduce pain    Currently in Pain?  Yes    Pain Score  4     Pain Location  Back    Pain Orientation  Right;Lower    Pain Descriptors / Indicators  Aching    Aggravating Factors   prolonged positions and turning the wrong way.     Pain Relieving Factors  TENS, TPR with ball.          [facility] PT Assessment - 11/28/17 0001      Assessment   Medical Diagnosis  lumbar fusion    Referring Provider   Dr. Ashok Pall     Onset Date/Surgical Date  08/17/18    Hand Dominance  Right    Next MD Visit  12/18/17    Prior Therapy  not after this surgery      Flexibility   Quadriceps  Lt 123 deg, Rt 123 (prone with strap)        [facility] Adult PT Treatment/Exercise - 11/28/17 0001      Lumbar Exercises: Stretches   Active Hamstring Stretch  Right;Left;3 reps;30 seconds    Quad Stretch  Right;Left;60 seconds;3 reps prone with strap    Piriformis Stretch  Right;Left;2 reps;30 seconds    Other Lumbar Stretch Exercise  Lt calf stretch x 30 sec x 2 reps (heel off step)      Lumbar Exercises: Aerobic   Nustep  L5x5' PTA present to discuss progress      Lumbar Exercises: Supine   Other Supine Lumbar Exercises  beginner pilates 100 (25 pulses x 4); pilated toe touches x 10 reps each      Lumbar Exercises: Sidelying   Other Sidelying Lumbar  Exercises  pilates leg circles x 10 CW/CCW each leg.       Lumbar Exercises: Quadruped   Straight Leg Raise  10 reps    Opposite Arm/Leg Raise  Right arm/Left leg;Left arm/Right leg;10 reps      Modalities   Modalities  Electrical Stimulation;Moist Heat      Moist Heat Therapy   Number Minutes Moist Heat  15 Minutes    Moist Heat Location  -- buttocks      Electrical Stimulation   Electrical Stimulation Location  Rt glute     Electrical Stimulation Action  IFC    Electrical Stimulation Parameters  to tolerance    Electrical Stimulation Goals  Tone;Pain             PT Education - 11/28/17 0752    Education provided  Yes    Education Details  HEP     Person(s) Educated  Patient    Methods  Explanation;Demonstration;Verbal cues;Handout    Comprehension  Verbalized understanding;Returned demonstration          PT Long Term Goals - 11/28/17 6606      PT LONG TERM GOAL #1   Title  I with HEP ( 12/05/17)     Time  4    Period  Weeks    Status  On-going      PT LONG TERM GOAL #2   Title  report =/> 75% reduction of low back and Rt  LE pain with sitting at work ( 12/05/17)     Time  4    Period  Weeks    Status  On-going 60% reduction reported 11/28/17      PT LONG TERM GOAL #3   Title  improve Rt LE strenth =/> 5-/5 through out ( 12/05/17)     Time  4    Period  Weeks    Status  Achieved      PT LONG TERM GOAL #4   Title  improve FOTO =/> 40% limited ( 12/05/17)     Time  4    Period  Weeks    Status  On-going            Plan - 11/28/17 0753    Clinical Impression Statement  Pt demonstrated  improved quad flexibility.  She had some minor calf cramping with exercises for LE; relieved with calf stretches.  Otherwise she tolerated all core/hip strengthening exercises well without increase in symptoms.  Pt reporting 60% reduction of pain during work hours.  Making great progress towards established goals.     Rehab Potential  Excellent    PT Frequency  2x / week    PT Duration  4 weeks    PT Treatment/Interventions  Dry needling;Manual techniques;Moist Heat;Patient/family education;Taping;Ultrasound;Cryotherapy;Electrical Stimulation    PT Next Visit Plan  DN;  MMT and assess goals; end of POC (FOTO)    Consulted and Agree with Plan of Care  Patient       Patient will benefit from skilled therapeutic intervention in order to improve the following deficits and impairments:  Pain, Postural dysfunction, Increased muscle spasms, Decreased strength  Visit Diagnosis: Chronic midline low back pain with right-sided sciatica  Muscle weakness (generalized)  Muscle spasm of back     Problem List Patient Active Problem List   Diagnosis Date Noted  . Stenosis of lateral recess of lumbar spine 08/17/2017  . Primary osteoarthritis of right knee 03/04/2017  . Subclinical hypothyroidism 05/01/2014  . Lumbar degenerative disc  disease 01/11/2013  . Obese 11/15/2011  . Sleep apnea 01/20/2011  . MOOD DISORDER 11/16/2010  . Insulin resistance 11/16/2010  . Hyperlipidemia 09/01/2010  . HYPERTENSION, BENIGN  09/01/2010   Kerin Perna, PTA 11/28/17 8:12 AM  [place], [place], [address] Phone: [phone]   Fax:  [phone]  Name: Loretta Bullock MRN: [id number] Date of Birth: [birth year]

## 2017-11-28 NOTE — Patient Instructions (Addendum)
[place]: Beginner    Start with feet flat. Lift head and hold. Pump hands slightly up and down. Breathe in for count of _5__. Breathe out for count of _5__.  OR --- [place]: Intermediate    Start with legs raised and knees bent. Lift head and hold. Pump hands slightly up and down. Breathe in for count of _5__. Breathe out for count of _5__.    Toe Touch    Lie on back, legs folded to chest, arms by sides. Exhale, lowering leg to just touch toes to mat. Inhale, returning knee to chest. Keep abdominals flat, navel to spine. Repeat __10__ times, alternating legs. Do ____ sessions per day.   Side Leg Circle    Lie on side, back straight along edge of mat, legs 30 in front of torso. Lift top leg to hip height. Rotate in small circle, _20__ times in each direction. Repeat _1-2___ times. Repeat on other side.   Healthy Back Strengthening - Back Extension on All Fours    Start on hands and knees, keeping them apart. Straighten right leg and left arm at the same time. Hold _1-5___ seconds. Switch immediately and repeat with left leg and right arm. Do ___10_ times.   OR ----  Arm / Leg Lift: Opposite (Prone)    Lift right leg and opposite arm __2-3__ inches from floor, keeping knee locked. Repeat _10___ times per set. Do _2___ sets per session.

## 2017-11-30 ENCOUNTER — Ambulatory Visit (INDEPENDENT_AMBULATORY_CARE_PROVIDER_SITE_OTHER): Payer: BLUE CROSS/BLUE SHIELD | Admitting: Physical Therapy

## 2017-11-30 ENCOUNTER — Encounter: Payer: Self-pay | Admitting: Physical Therapy

## 2017-11-30 DIAGNOSIS — G8929 Other chronic pain: Secondary | ICD-10-CM

## 2017-11-30 DIAGNOSIS — M6281 Muscle weakness (generalized): Secondary | ICD-10-CM

## 2017-11-30 DIAGNOSIS — M5441 Lumbago with sciatica, right side: Secondary | ICD-10-CM

## 2017-11-30 DIAGNOSIS — M62830 Muscle spasm of back: Secondary | ICD-10-CM | POA: Diagnosis not present

## 2017-11-30 NOTE — Therapy (Addendum)
[place], [place], [address] Phone: [phone]   Fax:  [phone]  Physical Therapy Treatment  Patient Details  Name: Loretta Bullock MRN: [id number] Date of Birth: 03-Feb-1961 Referring Provider: Dr. Ashok Pall    Encounter Date: 11/30/2017  PT End of Session - 11/30/17 0720    Visit Number  8    Number of Visits  8    Authorization - Visit Number  8    PT Start Time  0720    PT Stop Time  0812    PT Time Calculation (min)  52 min    Activity Tolerance  Patient tolerated treatment well       Past Medical History:  Diagnosis Date  . Anxiety   . DDD (degenerative disc disease), cervical   . History of lumbar laminectomy   . Hypertension   . PONV (postoperative nausea and vomiting)   . Sleep apnea    CPAP 7 cm water    Past Surgical History:  Procedure Laterality Date  . BACK SURGERY    . CESAREAN SECTION  2002  . LUMBAR DISC SURGERY  06/2013   Dr. Elliot Dally  . LUMBAR FUSION  08/17/2017  . MAXIMUM ACCESS (MAS)POSTERIOR LUMBAR INTERBODY FUSION (PLIF) 1 LEVEL  2016    There were no vitals filed for this visit.  Subjective Assessment - 11/30/17 0721    Subjective  Loretta Bullock reports she had been hurting in both sides of her low back.  She feels like it is muscular it she feels it with movement initally then it loosens up.     Patient Stated Goals  get stronger and reduce pain    Currently in Pain?  Yes    Pain Score  3     Pain Location  Back    Pain Orientation  Lower;Mid    Pain Descriptors / Indicators  Dull    Pain Type  Surgical pain    Pain Onset  More than a month ago    Pain Frequency  Intermittent    Aggravating Factors   initially with walking    Pain Relieving Factors  TENs TPR with ball          [facility] PT Assessment - 11/30/17 0001      Assessment   Medical Diagnosis  lumbar fusion      Observation/Other Assessments   Focus on Therapeutic Outcomes (FOTO)    43% limited      Strength   Strength Assessment Site  Hip;Lumbar    Right/Left Hip  Right    Right Hip Flexion  5/5    Right Hip Extension  5/5    Right Hip ABduction  5/5    Lumbar Flexion  -- TA Lt good, Rt slightly delayed then strong    Lumbar Extension  -- strong multifidi bilat                  [facility] Adult PT Treatment/Exercise - 11/30/17 0001      Lumbar Exercises: Stretches   Passive Hamstring Stretch  Right;Left;30 seconds with strap    ITB Stretch  Left;Right;30 seconds cross body with strap    Figure 4 Stretch  With overpressure;Supine;30 seconds;3 reps    Other Lumbar Stretch Exercise  prone quad stretch bilat with strap      Lumbar Exercises: Aerobic   Nustep  L5x5'      Modalities   Modalities  Electrical Stimulation;Moist Heat  Moist Heat Therapy   Number Minutes Moist Heat  15 Minutes    Moist Heat Location  -- buttocks      Electrical Stimulation   Electrical Stimulation Location  Rt glute     Electrical Stimulation Action  IFC    Electrical Stimulation Parameters  totolerance    Electrical Stimulation Goals  Tone;Pain      Manual Therapy   Manual Therapy  Soft tissue mobilization    Soft tissue mobilization  STm to Rt gluts and piriformis       Trigger Point Dry Needling - 11/30/17 0732    Consent Given?  Yes    Education Handout Provided  No    Muscles Treated Lower Body  Gluteus maximus;Gluteus minimus;Piriformis Rt    Gluteus Maximus Response  Palpable increased muscle length;Twitch response elicited    Gluteus Minimus Response  Palpable increased muscle length;Twitch response elicited    Piriformis Response  Palpable increased muscle length;Twitch response elicited                PT Long Term Goals - 11/30/17 0730      PT LONG TERM GOAL #1   Title  I with HEP ( 12/05/17)     Status  Achieved      PT LONG TERM GOAL #2   Title  report =/> 75% reduction of low back and Rt LE pain with sitting at work ( 12/05/17)      Status  Achieved 75% improvement per pt.       PT LONG TERM GOAL #3   Title  improve Rt LE strenth =/> 5-/5 through out ( 12/05/17)     Status  Achieved      PT LONG TERM GOAL #4   Title  improve FOTO =/> 40% limited ( 12/05/17)     Status  On-going 43% limited, some improvement            Plan - 11/30/17 0754    Clinical Impression Statement  Loretta Bullock continues to make improvements.  She has met most of her goals and is ready to work on her HEP independently at home. If she develops problems or increased pain she will call and schedule and appointment to continue with therapy.  Otherwise if she is doing well we will discharge her end of month.     PT Treatment/Interventions  Dry needling;Manual techniques;Moist Heat;Patient/family education;Taping;Ultrasound;Cryotherapy;Electrical Stimulation    PT Next Visit Plan  Place on hold for two weeks, end of March,, if she doesn't call then discharge to HEP     Consulted and Agree with Plan of Care  Patient       Patient will benefit from skilled therapeutic intervention in order to improve the following deficits and impairments:  Pain, Postural dysfunction, Increased muscle spasms, Decreased strength  Visit Diagnosis: Chronic midline low back pain with right-sided sciatica  Muscle weakness (generalized)  Muscle spasm of back     Problem List Patient Active Problem List   Diagnosis Date Noted  . Stenosis of lateral recess of lumbar spine 08/17/2017  . Primary osteoarthritis of right knee 03/04/2017  . Subclinical hypothyroidism 05/01/2014  . Lumbar degenerative disc disease 01/11/2013  . Obese 11/15/2011  . Sleep apnea 01/20/2011  . MOOD DISORDER 11/16/2010  . Insulin resistance 11/16/2010  . Hyperlipidemia 09/01/2010  . HYPERTENSION, BENIGN 09/01/2010    Jeral Pinch PT  11/30/2017, 8:00 AM  [facility] [place], [place], [address]  Phone: [phone]    Fax:  [phone]  Name: Loretta Bullock MRN: [id number] Date of Birth: [birth year]   PHYSICAL THERAPY DISCHARGE SUMMARY  Visits from Start of Care: 8  Current functional level related to goals / functional outcomes: See above for function at her last visit   Remaining deficits: Minimal, occasional back tightness   Education / Equipment: HEP Plan: Patient agrees to discharge.  Patient goals were partially met. Patient is being discharged due to being pleased with the current functional level.  ?????     Jeral Pinch, PT 12/21/17 8:45 AM

## 2018-03-19 IMAGING — MR MR LUMBAR SPINE WO/W CM
4 of 7 series · 25 of 48 positions shown · IV contrast (20 ML MULTIHANCE)
Comparison: MRI dated 02/01/2014

CLINICAL DATA: Right-sided low back pain and right leg pain with
numbness.

EXAM:
MRI LUMBAR SPINE WITHOUT AND WITH CONTRAST
TECHNIQUE: Multiplanar and multiecho pulse sequences of the lumbar spine were
obtained without and with intravenous contrast.
CONTRAST:  20mL MULTIHANCE GADOBENATE DIMEGLUMINE 529 MG/ML IV SOLN

[Series 2: T2 · sagittal · 4.0mm · 0.81mm/px · 5 of 15 slices shown (1 of 2)]
[im 1/15]
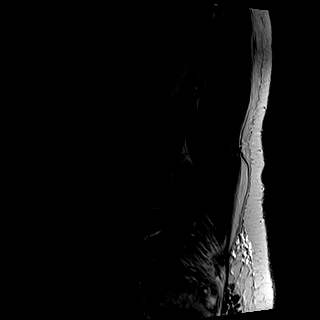
[im 4/15]
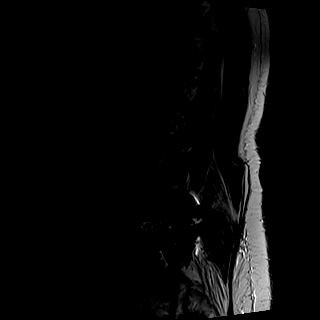
[im 8/15]
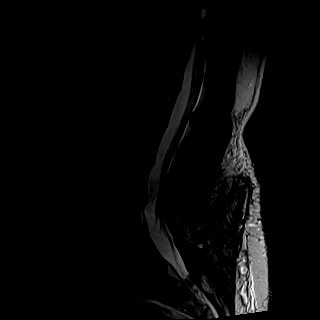
[im 11/15]
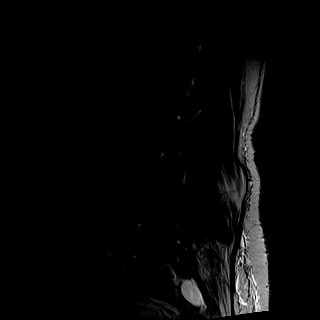
[im 15/15]
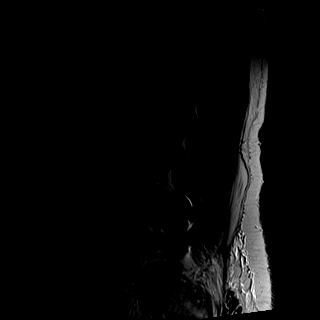

[Series 3: T1 · sagittal · 4.0mm · 0.41mm/px · 5 of 15 slices shown (1 of 2)]
[im 1/15]
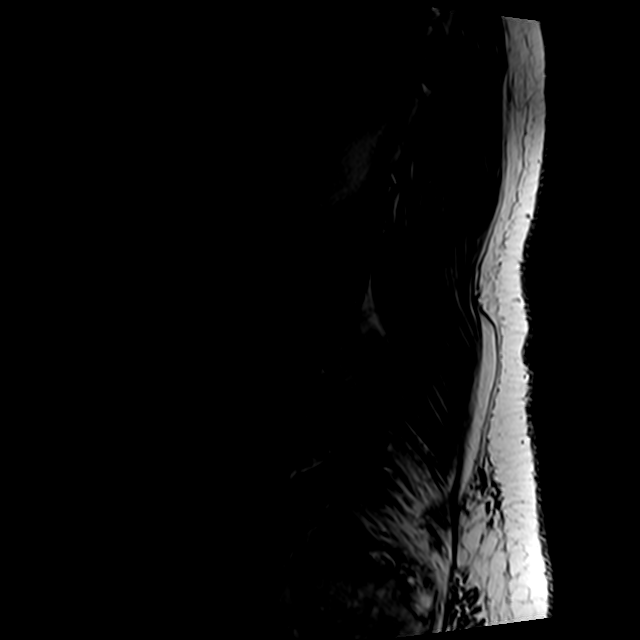
[im 4/15]
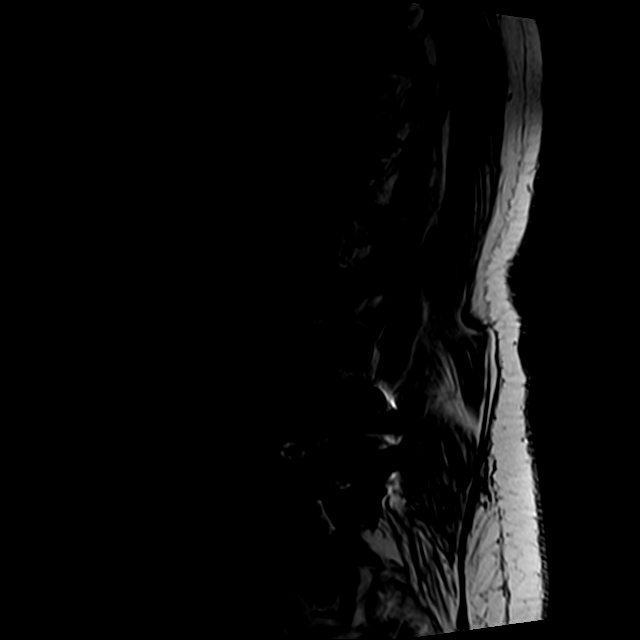
[im 8/15]
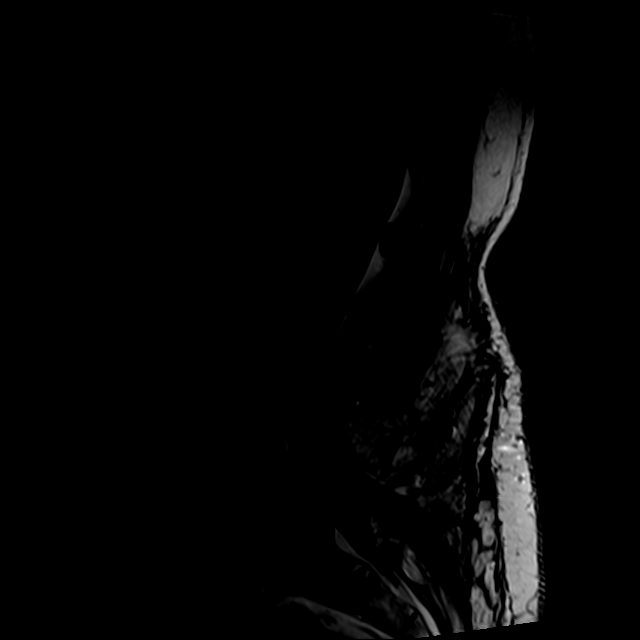
[im 11/15]
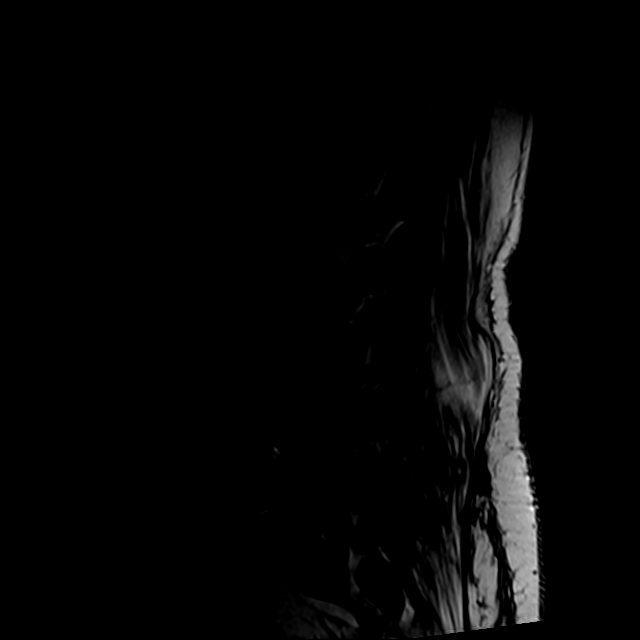
[im 15/15]
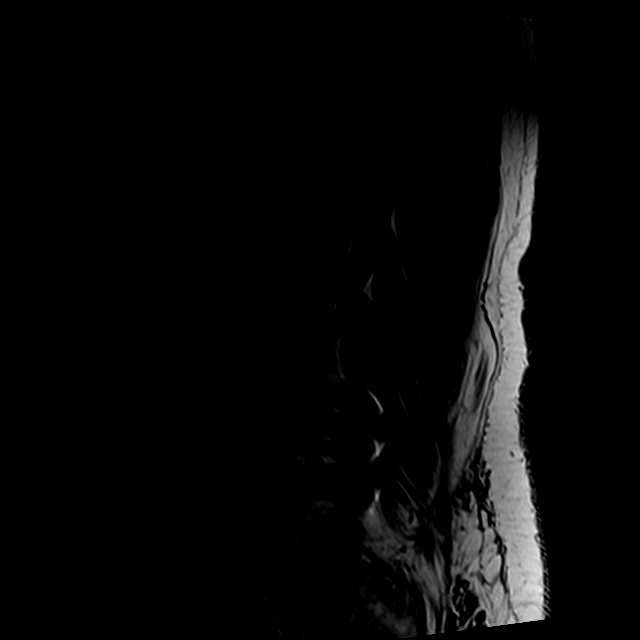

[Series 6: T1 · axial · 4.0mm · 0.39mm/px · z∈[-51,+139]mm · 7 of 37 slices shown (2 of 2)]
[im 1/37]
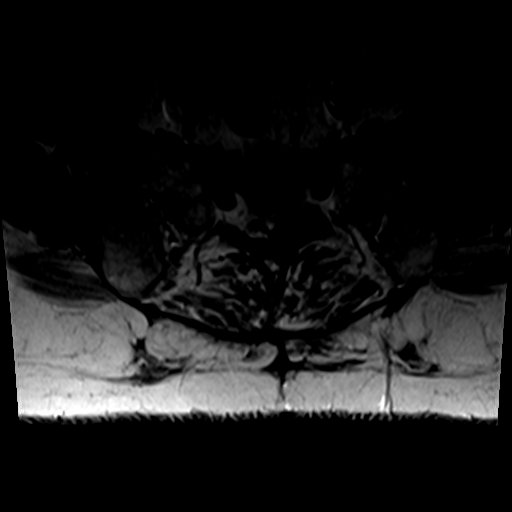
[im 5/37]
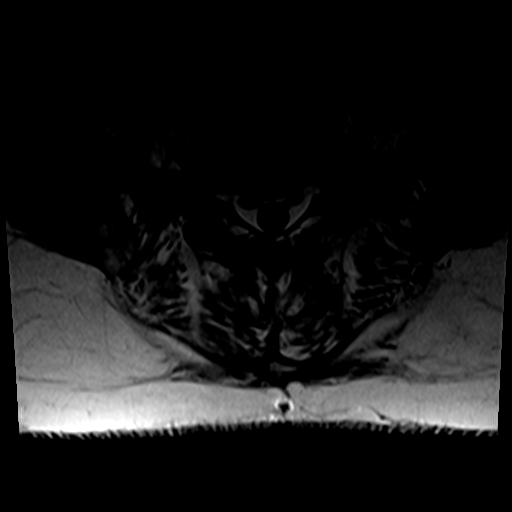
[im 13/37]
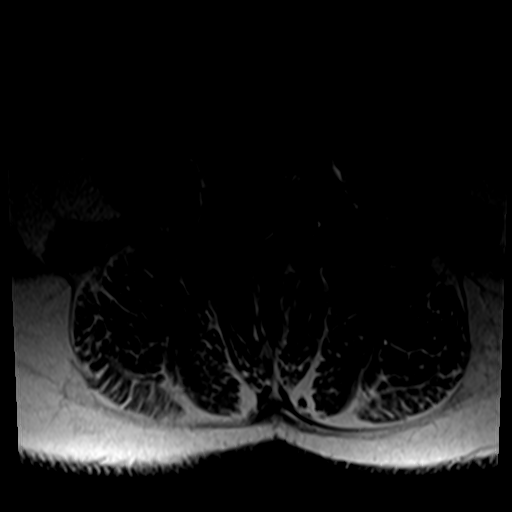
[im 17/37]
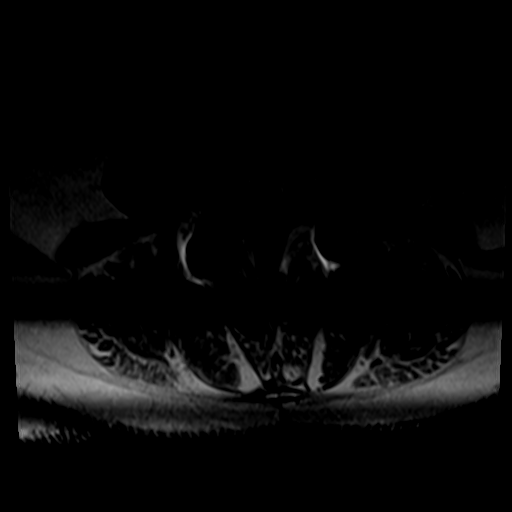
[im 21/37]
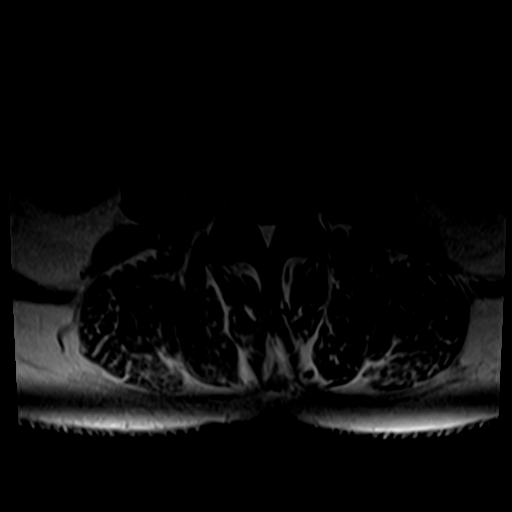
[im 25/37]
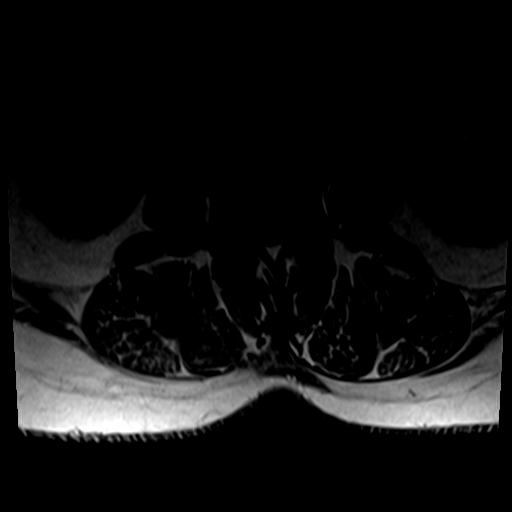
[im 33/37]
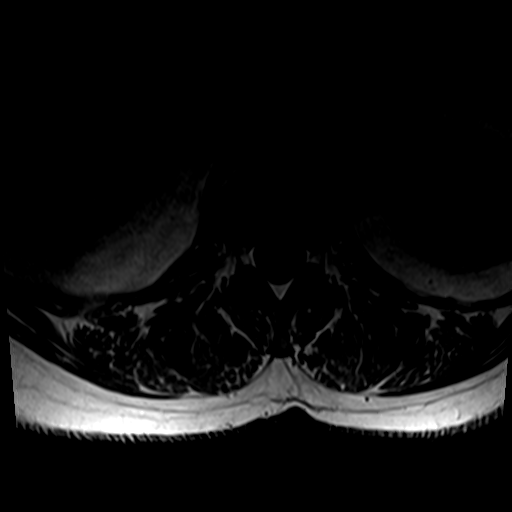

[Series 100: T2 · axial · 4.0mm · 0.78mm/px · z∈[-51,+158]mm · 8 of 37 slices shown (2 of 2)]
[im 1/37]
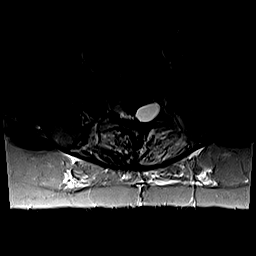
[im 5/37]
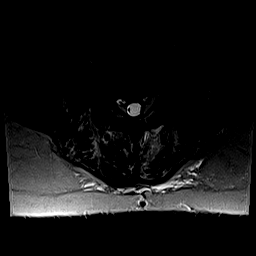
[im 13/37]
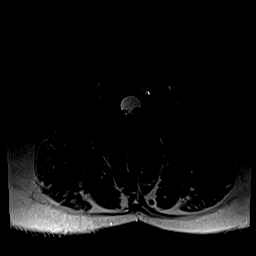
[im 17/37]
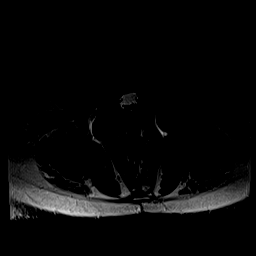
[im 21/37]
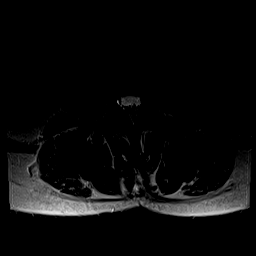
[im 25/37]
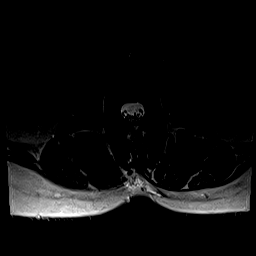
[im 33/37]
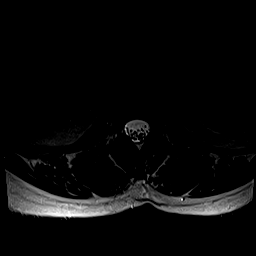
[im 37/37]
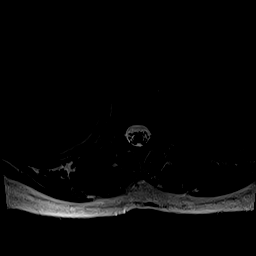

[25 of 48 positions shown; findings below may reference images not displayed]

FINDINGS: Segmentation:  Standard.

Alignment: Grade 1 spondylolisthesis at L4-5. Evidence of previous
interbody and posterior fusion with posterior decompression at L4-5.
3 mm retrolisthesis of L3 on L4.

Vertebrae:  No fracture, evidence of discitis, or bone lesion.

Conus medullaris: Extends to the T12-L1 level and appears normal.

Paraspinal and other soft tissues: Negative.

Disc levels:

L1-2:  Normal.

L2-3:  Tiny diffuse disc bulge with no neural impingement.

L3-4: Broad-based disc protrusion most prominent in the right
lateral recess and right neural foramen with a slight mass effect
upon the right side of the thecal sac. This could affect the right
L4 nerve. Right L3 nerve could be affected lateral to the neural
foramen.

L4-5: Interval interbody and posterior fusion. Stable grade 1
spondylolisthesis. Excellent posterior decompression. Minimal
enhancing scar around the thecal sac to the expected degree. No
residual neural impingement. Widely patent neural foramina.

L5-S1: Chronic disc space narrowing with a small broad-based soft
disc protrusion without neural impingement. The appearance is
essentially unchanged.

Prominent Tarlov cyst at S2, stable.
IMPRESSION: 1. Interval interbody and posterior fusion at L4-5 with no residual
impingement at that level.
2. New broad-based soft disc protrusion asymmetric to the right at
L3-4 extending foraminal and extraforaminal on the right. This could
affect the right L4 and L3 nerves as described above.
3. Chronic small soft disc protrusion at L5-S1 without neural
impingement.

## 2018-04-12 ENCOUNTER — Ambulatory Visit: Payer: BLUE CROSS/BLUE SHIELD | Admitting: Family Medicine

## 2018-04-27 ENCOUNTER — Ambulatory Visit: Payer: BLUE CROSS/BLUE SHIELD | Admitting: Family Medicine

## 2018-05-12 ENCOUNTER — Ambulatory Visit: Payer: BLUE CROSS/BLUE SHIELD | Admitting: Family Medicine

## 2018-05-17 ENCOUNTER — Other Ambulatory Visit: Payer: Self-pay | Admitting: Family Medicine

## 2018-05-17 DIAGNOSIS — I10 Essential (primary) hypertension: Secondary | ICD-10-CM

## 2018-05-18 ENCOUNTER — Other Ambulatory Visit: Payer: Self-pay | Admitting: Family Medicine

## 2018-05-29 ENCOUNTER — Encounter: Payer: Self-pay | Admitting: Family Medicine

## 2018-05-29 ENCOUNTER — Ambulatory Visit (INDEPENDENT_AMBULATORY_CARE_PROVIDER_SITE_OTHER): Payer: BLUE CROSS/BLUE SHIELD | Admitting: Family Medicine

## 2018-05-29 VITALS — BP 138/78 | HR 87 | Ht 60.0 in | Wt 214.0 lb

## 2018-05-29 DIAGNOSIS — Z23 Encounter for immunization: Secondary | ICD-10-CM

## 2018-05-29 DIAGNOSIS — E8881 Metabolic syndrome: Secondary | ICD-10-CM | POA: Diagnosis not present

## 2018-05-29 DIAGNOSIS — R748 Abnormal levels of other serum enzymes: Secondary | ICD-10-CM | POA: Diagnosis not present

## 2018-05-29 DIAGNOSIS — I10 Essential (primary) hypertension: Secondary | ICD-10-CM | POA: Diagnosis not present

## 2018-05-29 DIAGNOSIS — G4733 Obstructive sleep apnea (adult) (pediatric): Secondary | ICD-10-CM

## 2018-05-29 LAB — POCT GLYCOSYLATED HEMOGLOBIN (HGB A1C): Hemoglobin A1C: 5.9 % — AB (ref 4.0–5.6)

## 2018-05-29 MED ORDER — AMBULATORY NON FORMULARY MEDICATION: 0 refills | Status: DC

## 2018-05-29 MED ORDER — FUROSEMIDE 20 MG PO TABS: 20.0000 mg | ORAL_TABLET | Freq: Every day | ORAL | 1 refills | Status: DC | PRN

## 2018-05-29 NOTE — Progress Notes (Signed)
Subjective:    Patient ID: Loretta Bullock, female    DOB: Sep 18, 1961, 57 y.o.   MRN: [id number]  HPI Impaired fasting glucose-no increased thirst or urination. No symptoms consistent with hypoglycemia.   Hypertension- Pt denies chest pain, SOB, dizziness, or heart palpitations.  Taking meds as directed w/o problems.  Denies medication side effects.  Using Aleve for her back pain.  She reports that she has been swelling more frequently.  She says is definitely diet related but it has been happening a lot more than it used to.  She is also gained back some of her weight.  F/U elevated liver enzymes.    OSA -she would really like to get a new CPAP machine.  The one that she has about 57 years old and parts are starting to break on it.  She has not actually gotten new supplies in quite a long time and is just been using what she has but she does use it consistently and she even travels with it.  Review of Systems  BP 138/78   Pulse 87   Ht 5' (1.524 m)   Wt 214 lb (97.1 kg)   LMP 12/21/2012   SpO2 96%   BMI 41.79 kg/m     No Known Allergies  Past Medical History:  Diagnosis Date  . Anxiety   . DDD (degenerative disc disease), cervical   . History of lumbar laminectomy   . Hypertension   . PONV (postoperative nausea and vomiting)   . Sleep apnea    CPAP 7 cm water    Past Surgical History:  Procedure Laterality Date  . BACK SURGERY    . CESAREAN SECTION  2002  . LUMBAR DISC SURGERY  06/2013   Dr. Elliot Dally  . LUMBAR FUSION  08/17/2017  . MAXIMUM ACCESS (MAS)POSTERIOR LUMBAR INTERBODY FUSION (PLIF) 1 LEVEL  2016    Social History   Socioeconomic History  . Marital status: Married    Spouse name: Not on file  . Number of children: Not on file  . Years of education: Not on file  . Highest education level: Not on file  Occupational History  . Not on file  Social Needs  . Financial resource strain: Not on file  . Food insecurity:    Worry: Not on file   Inability: Not on file  . Transportation needs:    Medical: Not on file    Non-medical: Not on file  Tobacco Use  . Smoking status: Former Research scientist (life sciences)  . Smokeless tobacco: Never Used  Substance and Sexual Activity  . Alcohol use: Yes    Comment: occ  . Drug use: No  . Sexual activity: Not on file  Lifestyle  . Physical activity:    Days per week: Not on file    Minutes per session: Not on file  . Stress: Not on file  Relationships  . Social connections:    Talks on phone: Not on file    Gets together: Not on file    Attends religious service: Not on file    Active member of club or organization: Not on file    Attends meetings of clubs or organizations: Not on file    Relationship status: Not on file  . Intimate partner violence:    Fear of current or ex partner: Not on file    Emotionally abused: Not on file    Physically abused: Not on file    Forced sexual activity: Not on file  Other Topics Concern  . Not on file  Social History Narrative  . Not on file    Family History  Problem Relation Age of Onset  . Hyperlipidemia Father   . Hyperlipidemia Mother   . Hyperlipidemia Brother   . Diabetes Maternal Grandmother     Outpatient Encounter Medications as of 05/29/2018  Medication Sig  . acetaminophen (TYLENOL) 500 MG tablet Take 1,000 mg every 6 (six) hours as needed by mouth for moderate pain or headache.  . AMBULATORY NON FORMULARY MEDICATION Medication Name: CPAP  set to 7 cm of water. Please fit for new facemask with tubing. Humidifier. Diagnosis moderate obstructive sleep apnea. Please see sleep study from 01/27/2005.  Marland Kitchen AMBULATORY NON FORMULARY MEDICATION Medication Name: Need download from CPAP machine.  Patient doesn't feel like working as well since she has gained weight. Dx. OSA.  Company: [employer]  . Choline Fenofibrate (FENOFIBRIC ACID) 135 MG CPDR TAKE 1 CAPSULE EVERY DAY  . estradiol (VIVELLE-DOT) 0.075 MG/24HR APPLY 1 PATCH TWICE WEEKLY  . fexofenadine  (ALLEGRA) 180 MG tablet Take 180 mg by mouth daily.  Marland Kitchen Ketotifen Fumarate (ALAWAY OP) Place 1 drop 2 (two) times daily into both eyes.  . Multiple Vitamin (MULTIVITAMIN) capsule Take 1 capsule by mouth daily.    . naproxen sodium (ALEVE) 220 MG tablet Take 220 mg 2 (two) times daily as needed by mouth (for pain or headache).  Marland Kitchen omega-3 acid ethyl esters (LOVAZA) 1 g capsule TAKE 1 CAPSULE (1 G TOTAL) BY MOUTH 2 (TWO) TIMES DAILY. (Patient taking differently: Take 2g by mouth once daily)  . progesterone (PROMETRIUM) 200 MG capsule Take 200 mg See admin instructions by mouth. Take 200 mg by mouth daily for 2 weeks every other month  . valsartan-hydrochlorothiazide (DIOVAN-HCT) 320-25 MG tablet Take 1 tablet by mouth daily. Must keep upcoming appt  . venlafaxine (EFFEXOR-XR) 75 MG 24 hr capsule Take 75 mg by mouth daily.    . AMBULATORY NON FORMULARY MEDICATION Medication Name: Order for new CPAP machine with humidifier and supplies.  CPAP set to 7 cm. Fax to [facility].  Sleep study  Feb 01, 2005.  Machine is not working and is 57 years old.  . furosemide (LASIX) 20 MG tablet Take 1 tablet (20 mg total) by mouth daily as needed for edema.  . [DISCONTINUED] traMADol (ULTRAM) 50 MG tablet Take 1 tablet (50 mg total) by mouth 3 (three) times daily as needed. (Patient taking differently: Take 50 mg 3 (three) times daily as needed by mouth for moderate pain. )   No facility-administered encounter medications on file as of 05/29/2018.          Objective:   Physical Exam  Constitutional: She is oriented to person, place, and time. She appears well-developed and well-nourished.  HENT:  Head: Normocephalic and atraumatic.  Cardiovascular: Normal rate, regular rhythm and normal heart sounds.  Pulmonary/Chest: Effort normal and breath sounds normal.  Neurological: She is alert and oriented to person, place, and time.  Skin: Skin is warm and dry.  Trace ankle edema bilaterally.  Psychiatric: She has a  normal mood and affect. Her behavior is normal.        Assessment & Plan:  IFG -well controlled.  Heme globin A1c of 5.9 today which is fantastic.  Improved from last time.  Follow-up in 6 months.  HTN -she is also been getting some bilateral ankle swelling.  We discussed the importance of low-salt diet regular exercise.  Compression stockings would be  helpful as well.  Elevate when able.  She is already on 25 mg underscore thiazide daily in combination her blood pressure pill.  I am going give her a small amount of Lasix to take as needed and just encouraged her to keep an eye on how frequently she is using it.  If she finds she is using it more more frequently than we may need to add a potassium supplement.  Elevated liver enzymes -recheck liver enzymes.  Sleep apnea-I do have a copy of her interpretation from [facility] where he was performed in Feb 01, 2005.  I do not have the full report.  We will go ahead and place new referral for new CPAP machine and supplies.

## 2018-06-19 ENCOUNTER — Other Ambulatory Visit: Payer: Self-pay | Admitting: Family Medicine

## 2018-07-17 LAB — COMPLETE METABOLIC PANEL WITH GFR
AG RATIO: 1.9 (calc) (ref 1.0–2.5)
ALT: 36 U/L — AB (ref 6–29)
AST: 34 U/L (ref 10–35)
Albumin: 4.4 g/dL (ref 3.6–5.1)
Alkaline phosphatase (APISO): 30 U/L — ABNORMAL LOW (ref 33–130)
BILIRUBIN TOTAL: 0.5 mg/dL (ref 0.2–1.2)
BUN: 15 mg/dL (ref 7–25)
CHLORIDE: 101 mmol/L (ref 98–110)
CO2: 30 mmol/L (ref 20–32)
Calcium: 9.9 mg/dL (ref 8.6–10.4)
Creat: 0.9 mg/dL (ref 0.50–1.05)
GFR, EST AFRICAN AMERICAN: 82 mL/min/{1.73_m2} (ref >=60)
GFR, Est Non African American: 71 mL/min/{1.73_m2} (ref >=60)
Globulin: 2.3 g/dL (calc) (ref 1.9–3.7)
Glucose, Bld: 130 mg/dL — ABNORMAL HIGH (ref 65–99)
POTASSIUM: 3.9 mmol/L (ref 3.5–5.3)
SODIUM: 139 mmol/L (ref 135–146)
TOTAL PROTEIN: 6.7 g/dL (ref 6.1–8.1)

## 2018-07-17 LAB — LIPID PANEL
Cholesterol: 179 mg/dL (ref <200)
HDL: 39 mg/dL — AB (ref >50)
LDL Cholesterol (Calc): 113 mg/dL (calc) — ABNORMAL HIGH
Non-HDL Cholesterol (Calc): 140 mg/dL (calc) — ABNORMAL HIGH (ref <130)
TRIGLYCERIDES: 156 mg/dL — AB (ref <150)
Total CHOL/HDL Ratio: 4.6 (calc) (ref <5.0)

## 2018-07-19 ENCOUNTER — Telehealth: Payer: Self-pay | Admitting: *Deleted

## 2018-07-19 NOTE — Telephone Encounter (Signed)
Order for cpap faxed,confirmation received.Loretta Bullock, [place]

## 2018-08-14 ENCOUNTER — Other Ambulatory Visit: Payer: Self-pay | Admitting: Family Medicine

## 2018-08-14 DIAGNOSIS — I10 Essential (primary) hypertension: Secondary | ICD-10-CM

## 2018-08-21 ENCOUNTER — Ambulatory Visit (INDEPENDENT_AMBULATORY_CARE_PROVIDER_SITE_OTHER): Payer: BLUE CROSS/BLUE SHIELD | Admitting: Obstetrics & Gynecology

## 2018-08-21 ENCOUNTER — Encounter: Payer: Self-pay | Admitting: Obstetrics & Gynecology

## 2018-08-21 VITALS — BP 155/95 | HR 90 | Ht 60.0 in | Wt 213.0 lb

## 2018-08-21 DIAGNOSIS — Z1239 Encounter for other screening for malignant neoplasm of breast: Secondary | ICD-10-CM

## 2018-08-21 DIAGNOSIS — Z01419 Encounter for gynecological examination (general) (routine) without abnormal findings: Secondary | ICD-10-CM | POA: Diagnosis not present

## 2018-08-21 MED ORDER — ESTRADIOL 0.05 MG/24HR TD PTTW: 1.0000 | MEDICATED_PATCH | TRANSDERMAL | 2 refills | Status: DC

## 2018-08-21 MED ORDER — PROGESTERONE MICRONIZED 200 MG PO CAPS
200.0000 mg | ORAL_CAPSULE | ORAL | 6 refills | Status: DC
Start: 2018-08-21 — End: 2019-10-01

## 2018-08-21 NOTE — Progress Notes (Signed)
First BP 169/101. Repeat BP 155/95. Repeat BP 179/94  Last pap 07/2017- negative Last mammogram- 07/2017- negative

## 2018-08-21 NOTE — Progress Notes (Signed)
Subjective:     Loretta Bullock is a 57 y.o. female here for a routine exam.  Current complaints: wants to ween off HRT.  She is "hot all the time" but does not have flushes like used to in her 41s..     Gynecologic History Patient's last menstrual period was 12/21/2012. Contraception: post menopausal status Last Pap: 2018. Results were: normal per patient--getting records from Dr. Willis Modena Last mammogram: 2018. Results were: normal  Obstetric History OB History  Gravida Para Term Preterm AB Living  2 1     1 1   SAB TAB Ectopic Multiple Live Births      1        # Outcome Date GA Lbr Len/2nd Weight Sex Delivery Anes PTL Lv  2 Ectopic           1 Para              The following portions of the patient's history were reviewed and updated as appropriate: allergies, current medications, past family history, past medical history, past social history, past surgical history and problem list.  Review of Systems Pertinent items noted in HPI and remainder of comprehensive ROS otherwise negative.    Objective:      Vitals:   08/21/18 1309  BP: (!) 155/95  Pulse: 90  Weight: 213 lb (96.6 kg)  Height: 5' (1.524 m)   Vitals:  WNL General appearance: alert, cooperative and no distress  HEENT: Normocephalic, without obvious abnormality, atraumatic Eyes: negative Throat: lips, mucosa, and tongue normal; teeth and gums normal  Respiratory: Clear to auscultation bilaterally  CV: Regular rate and rhythm  Breasts:  Normal appearance, no masses or tenderness, no nipple retraction or dimpling  GI: Soft, non-tender; bowel sounds normal; no masses,  no organomegaly  GU: External Genitalia:  Tanner V, no lesion Urethra:  No prolapse   Vagina: Pink, normal rugae, no blood or discharge  Cervix: No CMT, no lesion  Uterus:  Normal size and contour, non tender  Adnexa: Normal, no masses, non tender  Musculoskeletal: No edema, redness or tenderness in the calves or thighs  Skin: No lesions or  rash  Lymphatic: Axillary adenopathy: none     Psychiatric: Normal mood and behavior   Assessment:    Healthy female exam.   HTN  Plan:    Pap due 2021 (getting paps drom dr. Willis Modena) Mammogram yearly Ween HRT.  Will decrease to Vivelle 0.05 for 3 months then to 0.0375 for 3 months then to 0.025 for 3 months.  Will continue prometrium If pt had vaginal symptoms can add Vagifem  Pt to f.u with PCP tomorrow for BP check or sooner if symptomatic.  Pt thinks it is due o heavy salt diet the past few days over Thanksgiving.

## 2018-08-23 ENCOUNTER — Telehealth: Payer: Self-pay | Admitting: Family Medicine

## 2018-08-23 ENCOUNTER — Encounter: Payer: Self-pay | Admitting: Family Medicine

## 2018-08-23 ENCOUNTER — Ambulatory Visit (INDEPENDENT_AMBULATORY_CARE_PROVIDER_SITE_OTHER): Payer: BLUE CROSS/BLUE SHIELD | Admitting: Family Medicine

## 2018-08-23 VITALS — BP 137/86 | HR 88 | Ht 60.0 in | Wt 211.0 lb

## 2018-08-23 DIAGNOSIS — G4733 Obstructive sleep apnea (adult) (pediatric): Secondary | ICD-10-CM | POA: Diagnosis not present

## 2018-08-23 DIAGNOSIS — I10 Essential (primary) hypertension: Secondary | ICD-10-CM

## 2018-08-23 DIAGNOSIS — E8881 Metabolic syndrome: Secondary | ICD-10-CM

## 2018-08-23 DIAGNOSIS — E88819 Insulin resistance, unspecified: Secondary | ICD-10-CM

## 2018-08-23 LAB — POCT GLYCOSYLATED HEMOGLOBIN (HGB A1C): HEMOGLOBIN A1C: 7.1 % — AB (ref 4.0–5.6)

## 2018-08-23 MED ORDER — AMLODIPINE BESYLATE 5 MG PO TABS: 5.0000 mg | ORAL_TABLET | Freq: Every day | ORAL | 3 refills | Status: DC

## 2018-08-23 MED ORDER — VALSARTAN-HYDROCHLOROTHIAZIDE 320-25 MG PO TABS: 1.0000 | ORAL_TABLET | Freq: Every day | ORAL | 1 refills | Status: DC

## 2018-08-23 NOTE — Progress Notes (Signed)
Acute Office Visit  Subjective:    Patient ID: Loretta Bullock, female    DOB: 23-Nov-1960, 57 y.o.   MRN: [id number]  Chief Complaint  Patient presents with  . Hypertension  . abnormal glucose    HPI Patient is in today for Elevated BPs.  For the past week she has felt more tired, irritable and having more HA.  HA start in the back of her head but sometime frontal.  Resting does seem to help.  Her BP was high when went to see her OB earlier this week.  She admits she doesn't hydrate well.  NO recent change in caffeine intake.  She denies any flashing light before HA. Not using the Aleve.  She is on Diovan-hct. She has been stressed at home withher teenage son. Has been eating out more at work and on weekends.    OSA - using her CPAP regularly.  REally like her new machine.    Impaired fasting glucose-no increased thirst or urination. No symptoms consistent with hypoglycemia.   Past Medical History:  Diagnosis Date  . Anxiety   . DDD (degenerative disc disease), cervical   . History of lumbar laminectomy   . Hypertension   . PONV (postoperative nausea and vomiting)   . Sleep apnea    CPAP 7 cm water    Past Surgical History:  Procedure Laterality Date  . BACK SURGERY    . CESAREAN SECTION  2002  . LUMBAR DISC SURGERY  06/2013   Dr. Elliot Dally  . LUMBAR FUSION  08/17/2017  . MAXIMUM ACCESS (MAS)POSTERIOR LUMBAR INTERBODY FUSION (PLIF) 1 LEVEL  2016    Family History  Problem Relation Age of Onset  . Hyperlipidemia Father   . Hyperlipidemia Mother   . Hyperlipidemia Brother   . Diabetes Maternal Grandmother   . Colon cancer Maternal Aunt     Social History   Socioeconomic History  . Marital status: Married    Spouse name: Not on file  . Number of children: Not on file  . Years of education: Not on file  . Highest education level: Not on file  Occupational History  . Not on file  Social Needs  . Financial resource strain: Not on file  . Food insecurity:     Worry: Not on file    Inability: Not on file  . Transportation needs:    Medical: Not on file    Non-medical: Not on file  Tobacco Use  . Smoking status: Former Research scientist (life sciences)  . Smokeless tobacco: Never Used  Substance and Sexual Activity  . Alcohol use: Yes    Comment: occ  . Drug use: No  . Sexual activity: Yes    Birth control/protection: Post-menopausal  Lifestyle  . Physical activity:    Days per week: Not on file    Minutes per session: Not on file  . Stress: Not on file  Relationships  . Social connections:    Talks on phone: Not on file    Gets together: Not on file    Attends religious service: Not on file    Active member of club or organization: Not on file    Attends meetings of clubs or organizations: Not on file    Relationship status: Not on file  . Intimate partner violence:    Fear of current or ex partner: Not on file    Emotionally abused: Not on file    Physically abused: Not on file    Forced sexual activity:  Not on file  Other Topics Concern  . Not on file  Social History Narrative  . Not on file    Outpatient Medications Prior to Visit  Medication Sig Dispense Refill  . acetaminophen (TYLENOL) 500 MG tablet Take 1,000 mg every 6 (six) hours as needed by mouth for moderate pain or headache.    . AMBULATORY NON FORMULARY MEDICATION Medication Name: CPAP  set to 7 cm of water. Please fit for new facemask with tubing. Humidifier. Diagnosis moderate obstructive sleep apnea. Please see sleep study from 01/27/2005. 1 vial 0  . Choline Fenofibrate (FENOFIBRIC ACID) 135 MG CPDR TAKE 1 CAPSULE EVERY DAY 90 capsule 3  . estradiol (VIVELLE-DOT) 0.05 MG/24HR patch Place 1 patch (0.05 mg total) onto the skin 2 (two) times a week. 8 patch 2  . fexofenadine (ALLEGRA) 180 MG tablet Take 180 mg by mouth daily.    . furosemide (LASIX) 20 MG tablet Take 1 tablet (20 mg total) by mouth daily as needed for edema. 30 tablet 1  . Ketotifen Fumarate (ALAWAY OP) Place 1 drop 2  (two) times daily into both eyes.    . Multiple Vitamin (MULTIVITAMIN) capsule Take 1 capsule by mouth daily.      . naproxen sodium (ALEVE) 220 MG tablet Take 220 mg 2 (two) times daily as needed by mouth (for pain or headache).    Marland Kitchen omega-3 acid ethyl esters (LOVAZA) 1 g capsule TAKE 1 CAPSULE (1 G TOTAL) BY MOUTH 2 (TWO) TIMES DAILY. 180 capsule 3  . progesterone (PROMETRIUM) 200 MG capsule Take 1 capsule (200 mg total) by mouth See admin instructions. Take 200 mg by mouth daily for 2 weeks every other month 14 capsule 6  . venlafaxine (EFFEXOR-XR) 75 MG 24 hr capsule Take 75 mg by mouth daily.      . AMBULATORY NON FORMULARY MEDICATION Medication Name: Need download from CPAP machine.  Patient doesn't feel like working as well since she has gained weight. Dx. OSA.  Company: [employer] 1 Units 0  . valsartan-hydrochlorothiazide (DIOVAN-HCT) 320-25 MG tablet Take 1 tablet by mouth daily. 90 tablet 0  . AMBULATORY NON FORMULARY MEDICATION Medication Name: Order for new CPAP machine with humidifier and supplies.  CPAP set to 7 cm. Fax to [facility].  Sleep study  Feb 01, 2005.  Machine is not working and is 57 years old. 1 vial 0   No facility-administered medications prior to visit.     No Known Allergies  ROS     Objective:    Physical Exam  Constitutional: She is oriented to person, place, and time. She appears well-developed and well-nourished.  HENT:  Head: Normocephalic and atraumatic.  Cardiovascular: Normal rate, regular rhythm and normal heart sounds.  Pulmonary/Chest: Effort normal and breath sounds normal.  Neurological: She is alert and oriented to person, place, and time.  Skin: Skin is warm and dry.  Psychiatric: She has a normal mood and affect. Her behavior is normal.    BP 137/86   Pulse 88   Ht 5' (1.524 m)   Wt 211 lb (95.7 kg)   LMP 12/21/2012   SpO2 98%   BMI 41.21 kg/m  Wt Readings from Last 3 Encounters:  08/23/18 211 lb (95.7 kg)  08/21/18 213 lb (96.6 kg)   05/29/18 214 lb (97.1 kg)    There are no preventive care reminders to display for this patient.  There are no preventive care reminders to display for this patient.   Lab Results  Component Value Date   TSH 4.14 04/16/2016   Lab Results  Component Value Date   WBC 3.9 (L) 08/15/2017   HGB 14.1 08/15/2017   HCT 40.9 08/15/2017   MCV 85.4 08/15/2017   PLT 236 08/15/2017   Lab Results  Component Value Date   NA 139 07/17/2018   K 3.9 07/17/2018   CO2 30 07/17/2018   GLUCOSE 130 (H) 07/17/2018   BUN 15 07/17/2018   CREATININE 0.90 07/17/2018   BILITOT 0.5 07/17/2018   ALKPHOS 33 01/20/2017   AST 34 07/17/2018   ALT 36 (H) 07/17/2018   PROT 6.7 07/17/2018   ALBUMIN 5.1 01/20/2017   CALCIUM 9.9 07/17/2018   ANIONGAP 8 08/15/2017   Lab Results  Component Value Date   CHOL 179 07/17/2018   Lab Results  Component Value Date   HDL 39 (L) 07/17/2018   Lab Results  Component Value Date   LDLCALC 113 (H) 07/17/2018   Lab Results  Component Value Date   TRIG 156 (H) 07/17/2018   Lab Results  Component Value Date   CHOLHDL 4.6 07/17/2018   Lab Results  Component Value Date   HGBA1C 7.1 (A) 08/23/2018       Assessment & Plan:   Problem List Items Addressed This Visit      Cardiovascular and Mediastinum   HYPERTENSION, BENIGN - Primary    We discussed options.  We will start her on low-dose amlodipine 1 about potential side effects.  In the meantime we will work on diet and exercise and really cutting back on salt.  Additional information provided on the DASH diet.  Also encouraged weight loss and cutting back on eating out.. Follow up in  3 mo.       Relevant Medications   amLODipine (NORVASC) 5 MG tablet   valsartan-hydrochlorothiazide (DIOVAN-HCT) 320-25 MG tablet     Respiratory   Sleep apnea    Like her new machine is working well.  Will call to get download from [facility] [facility] to make sure that her numbers look good.        Endocrine   Insulin  resistance    A1c uncontrolled today with a hemoglobin A1c of 7.1.  She has been eating out a lot at work and on the weekends and on the go plus her stress levels have been high.  We discussed working on getting back on track and really working on increasing her vegetables and significantly decreasing her carb intake.  She says she feels like crackers are her weakness and she eats a lot of them.  Also working on some weight loss would be helpful as well.  If in 90 days she is able to get her A1c back down into the 5 range which is where it was previously then we will just continue to monitor.  If her A1c is 6.5 or higher than we will need to start medication.  Patient prefers to try to work on diet and exercise first.      Relevant Orders   POCT glycosylated hemoglobin (Hb A1C) (Completed)    Other Visit Diagnoses    OSA (obstructive sleep apnea)           Meds ordered this encounter  Medications  . amLODipine (NORVASC) 5 MG tablet    Sig: Take 1 tablet (5 mg total) by mouth daily.    Dispense:  30 tablet    Refill:  3  . valsartan-hydrochlorothiazide (DIOVAN-HCT) 320-25 MG tablet  Sig: Take 1 tablet by mouth daily.    Dispense:  90 tablet    Refill:  1     Beatrice Lecher, MD

## 2018-08-23 NOTE — Telephone Encounter (Signed)
Please call [employer] for CPAP download.

## 2018-08-23 NOTE — Assessment & Plan Note (Signed)
Like her new machine is working well.  Will call to get download from [facility] [facility] to make sure that her numbers look good.

## 2018-08-23 NOTE — Patient Instructions (Signed)
DASH Eating Plan DASH stands for "Dietary Approaches to Stop Hypertension." The DASH eating plan is a healthy eating plan that has been shown to reduce high blood pressure (hypertension). It may also reduce your risk for type 2 diabetes, heart disease, and stroke. The DASH eating plan may also help with weight loss. What are tips for following this plan? General guidelines  Avoid eating more than 2,300 mg (milligrams) of salt (sodium) a day. If you have hypertension, you may need to reduce your sodium intake to 1,500 mg a day.  Limit alcohol intake to no more than 1 drink a day for nonpregnant women and 2 drinks a day for men. One drink equals 12 oz of beer, 5 oz of wine, or 1 oz of hard liquor.  Work with your health care provider to maintain a healthy body weight or to lose weight. Ask what an ideal weight is for you.  Get at least 30 minutes of exercise that causes your heart to beat faster (aerobic exercise) most days of the week. Activities may include walking, swimming, or biking.  Work with your health care provider or diet and nutrition specialist (dietitian) to adjust your eating plan to your individual calorie needs. Reading food labels  Check food labels for the amount of sodium per serving. Choose foods with less than 5 percent of the Daily Value of sodium. Generally, foods with less than 300 mg of sodium per serving fit into this eating plan.  To find whole grains, look for the word "whole" as the first word in the ingredient list. Shopping  Buy products labeled as "low-sodium" or "no salt added."  Buy fresh foods. Avoid canned foods and premade or frozen meals. Cooking  Avoid adding salt when cooking. Use salt-free seasonings or herbs instead of table salt or sea salt. Check with your health care provider or pharmacist before using salt substitutes.  Do not fry foods. Cook foods using healthy methods such as baking, boiling, grilling, and broiling instead.  Cook with  heart-healthy oils, such as olive, canola, soybean, or sunflower oil. Meal planning   Eat a balanced diet that includes: ? 5 or more servings of fruits and vegetables each day. At each meal, try to fill half of your plate with fruits and vegetables. ? Up to 6-8 servings of whole grains each day. ? Less than 6 oz of lean meat, poultry, or fish each day. A 3-oz serving of meat is about the same size as a deck of cards. One egg equals 1 oz. ? 2 servings of low-fat dairy each day. ? A serving of nuts, seeds, or beans 5 times each week. ? Heart-healthy fats. Healthy fats called Omega-3 fatty acids are found in foods such as flaxseeds and coldwater fish, like sardines, salmon, and mackerel.  Limit how much you eat of the following: ? Canned or prepackaged foods. ? Food that is high in trans fat, such as fried foods. ? Food that is high in saturated fat, such as fatty meat. ? Sweets, desserts, sugary drinks, and other foods with added sugar. ? Full-fat dairy products.  Do not salt foods before eating.  Try to eat at least 2 vegetarian meals each week.  Eat more home-cooked food and less restaurant, buffet, and fast food.  When eating at a restaurant, ask that your food be prepared with less salt or no salt, if possible. What foods are recommended? The items listed may not be a complete list. Talk with your dietitian about what   dietary choices are best for you. Grains Whole-grain or whole-wheat bread. Whole-grain or whole-wheat pasta. Brown rice. Oatmeal. Quinoa. Bulgur. Whole-grain and low-sodium cereals. Pita bread. Low-fat, low-sodium crackers. Whole-wheat flour tortillas. Vegetables Fresh or frozen vegetables (raw, steamed, roasted, or grilled). Low-sodium or reduced-sodium tomato and vegetable juice. Low-sodium or reduced-sodium tomato sauce and tomato paste. Low-sodium or reduced-sodium canned vegetables. Fruits All fresh, dried, or frozen fruit. Canned fruit in natural juice (without  added sugar). Meat and other protein foods Skinless chicken or turkey. Ground chicken or turkey. Pork with fat trimmed off. Fish and seafood. Egg whites. Dried beans, peas, or lentils. Unsalted nuts, nut butters, and seeds. Unsalted canned beans. Lean cuts of beef with fat trimmed off. Low-sodium, lean deli meat. Dairy Low-fat (1%) or fat-free (skim) milk. Fat-free, low-fat, or reduced-fat cheeses. Nonfat, low-sodium ricotta or cottage cheese. Low-fat or nonfat yogurt. Low-fat, low-sodium cheese. Fats and oils Soft margarine without trans fats. Vegetable oil. Low-fat, reduced-fat, or light mayonnaise and salad dressings (reduced-sodium). Canola, safflower, olive, soybean, and sunflower oils. Avocado. Seasoning and other foods Herbs. Spices. Seasoning mixes without salt. Unsalted popcorn and pretzels. Fat-free sweets. What foods are not recommended? The items listed may not be a complete list. Talk with your dietitian about what dietary choices are best for you. Grains Baked goods made with fat, such as croissants, muffins, or some breads. Dry pasta or rice meal packs. Vegetables Creamed or fried vegetables. Vegetables in a cheese sauce. Regular canned vegetables (not low-sodium or reduced-sodium). Regular canned tomato sauce and paste (not low-sodium or reduced-sodium). Regular tomato and vegetable juice (not low-sodium or reduced-sodium). Pickles. Olives. Fruits Canned fruit in a light or heavy syrup. Fried fruit. Fruit in cream or butter sauce. Meat and other protein foods Fatty cuts of meat. Ribs. Fried meat. Bacon. Sausage. Bologna and other processed lunch meats. Salami. Fatback. Hotdogs. Bratwurst. Salted nuts and seeds. Canned beans with added salt. Canned or smoked fish. Whole eggs or egg yolks. Chicken or turkey with skin. Dairy Whole or 2% milk, cream, and half-and-half. Whole or full-fat cream cheese. Whole-fat or sweetened yogurt. Full-fat cheese. Nondairy creamers. Whipped toppings.  Processed cheese and cheese spreads. Fats and oils Butter. Stick margarine. Lard. Shortening. Ghee. Bacon fat. Tropical oils, such as coconut, palm kernel, or palm oil. Seasoning and other foods Salted popcorn and pretzels. Onion salt, garlic salt, seasoned salt, table salt, and sea salt. Worcestershire sauce. Tartar sauce. Barbecue sauce. Teriyaki sauce. Soy sauce, including reduced-sodium. Steak sauce. Canned and packaged gravies. Fish sauce. Oyster sauce. Cocktail sauce. Horseradish that you find on the shelf. Ketchup. Mustard. Meat flavorings and tenderizers. Bouillon cubes. Hot sauce and Tabasco sauce. Premade or packaged marinades. Premade or packaged taco seasonings. Relishes. Regular salad dressings. Where to find more information:  National Heart, Lung, and Blood Institute: www.nhlbi.nih.gov  American Heart Association: www.heart.org Summary  The DASH eating plan is a healthy eating plan that has been shown to reduce high blood pressure (hypertension). It may also reduce your risk for type 2 diabetes, heart disease, and stroke.  With the DASH eating plan, you should limit salt (sodium) intake to 2,300 mg a day. If you have hypertension, you may need to reduce your sodium intake to 1,500 mg a day.  When on the DASH eating plan, aim to eat more fresh fruits and vegetables, whole grains, lean proteins, low-fat dairy, and heart-healthy fats.  Work with your health care provider or diet and nutrition specialist (dietitian) to adjust your eating plan to your individual   calorie needs. This information is not intended to replace advice given to you by your health care provider. Make sure you discuss any questions you have with your health care provider. Document Released: 08/26/2011 Document Revised: 08/30/2016 Document Reviewed: 08/30/2016 Elsevier Interactive Patient Education  2018 Elsevier Inc.  

## 2018-08-23 NOTE — Assessment & Plan Note (Signed)
A1c uncontrolled today with a hemoglobin A1c of 7.1.  She has been eating out a lot at work and on the weekends and on the go plus her stress levels have been high.  We discussed working on getting back on track and really working on increasing her vegetables and significantly decreasing her carb intake.  She says she feels like crackers are her weakness and she eats a lot of them.  Also working on some weight loss would be helpful as well.  If in 90 days she is able to get her A1c back down into the 5 range which is where it was previously then we will just continue to monitor.  If her A1c is 6.5 or higher than we will need to start medication.  Patient prefers to try to work on diet and exercise first.

## 2018-08-23 NOTE — Telephone Encounter (Signed)
Called [facility] and asked that CPAP download be faxed to our office.Maryruth Eve, Lahoma Crocker, CMA

## 2018-08-23 NOTE — Assessment & Plan Note (Addendum)
We discussed options.  We will start her on low-dose amlodipine 1 about potential side effects.  In the meantime we will work on diet and exercise and really cutting back on salt.  Additional information provided on the DASH diet.  Also encouraged weight loss and cutting back on eating out.. Follow up in  3 mo.

## 2018-08-30 ENCOUNTER — Encounter: Payer: Self-pay | Admitting: *Deleted

## 2018-09-08 ENCOUNTER — Ambulatory Visit (INDEPENDENT_AMBULATORY_CARE_PROVIDER_SITE_OTHER): Payer: BLUE CROSS/BLUE SHIELD

## 2018-09-08 DIAGNOSIS — Z1239 Encounter for other screening for malignant neoplasm of breast: Secondary | ICD-10-CM | POA: Diagnosis not present

## 2018-09-12 ENCOUNTER — Other Ambulatory Visit: Payer: Self-pay | Admitting: Obstetrics & Gynecology

## 2018-10-11 ENCOUNTER — Other Ambulatory Visit: Payer: Self-pay | Admitting: *Deleted

## 2018-10-11 MED ORDER — VENLAFAXINE HCL ER 75 MG PO CP24: 75.0000 mg | ORAL_CAPSULE | Freq: Every day | ORAL | 2 refills | Status: DC

## 2018-10-12 ENCOUNTER — Telehealth: Payer: Self-pay | Admitting: *Deleted

## 2018-10-12 MED ORDER — ESTRADIOL 0.05 MG/24HR TD PTTW: 1.0000 | MEDICATED_PATCH | TRANSDERMAL | 1 refills | Status: DC

## 2018-10-12 NOTE — Telephone Encounter (Signed)
Pt wanted to stay on Vivelle Dot  5 [address] Progression Recent Vital Signs   @VS @   Past Medical History:  Diagnosis Date  . Anxiety   . DDD (degenerative disc disease), cervical   . History of lumbar laminectomy   . Hypertension   . PONV (postoperative nausea and vomiting)   . Sleep apnea    CPAP 7 cm water     Expected Discharge Date  @FLOW (132440::1)@  Diet Order    None       VTE Documentation  @FLOW (0272536::6)@   Work Intensity Score/Level of Care  @FLOW (10536::1)@  @LEVELOFCARE @   Mobility  @FLOW (7060220::1)@     Significant Events    DC Barriers   Abnormal Labs:  Asencion Islam 10/12/2018, 2:15 PM because she was afraid she would have the hot flashes.  She has been on HRT for 4 years.  Spoke with Dr Gala Romney and she has agreed to keep her on this dose and have her to return to see Dr Gala Romney in 6 months.  Pt agrees.

## 2018-11-19 ENCOUNTER — Other Ambulatory Visit: Payer: Self-pay | Admitting: Family Medicine

## 2018-11-19 DIAGNOSIS — I10 Essential (primary) hypertension: Secondary | ICD-10-CM

## 2018-11-23 ENCOUNTER — Ambulatory Visit: Payer: BLUE CROSS/BLUE SHIELD | Admitting: Family Medicine

## 2018-11-27 ENCOUNTER — Ambulatory Visit: Payer: BLUE CROSS/BLUE SHIELD

## 2018-12-11 ENCOUNTER — Encounter: Payer: Self-pay | Admitting: Family Medicine

## 2018-12-11 ENCOUNTER — Ambulatory Visit (INDEPENDENT_AMBULATORY_CARE_PROVIDER_SITE_OTHER): Payer: BLUE CROSS/BLUE SHIELD | Admitting: Family Medicine

## 2018-12-11 ENCOUNTER — Other Ambulatory Visit: Payer: Self-pay

## 2018-12-11 VITALS — BP 135/76 | HR 91 | Ht 60.0 in | Wt 201.0 lb

## 2018-12-11 DIAGNOSIS — I10 Essential (primary) hypertension: Secondary | ICD-10-CM

## 2018-12-11 DIAGNOSIS — G4733 Obstructive sleep apnea (adult) (pediatric): Secondary | ICD-10-CM | POA: Diagnosis not present

## 2018-12-11 DIAGNOSIS — E1165 Type 2 diabetes mellitus with hyperglycemia: Secondary | ICD-10-CM

## 2018-12-11 LAB — POCT GLYCOSYLATED HEMOGLOBIN (HGB A1C): Hemoglobin A1C: 11.8 % — AB (ref 4.0–5.6)

## 2018-12-11 MED ORDER — SITAGLIP PHOS-METFORMIN HCL ER 50-500 MG PO TB24: 1.0000 | ORAL_TABLET | Freq: Every day | ORAL | 2 refills | Status: DC

## 2018-12-11 NOTE — Progress Notes (Signed)
Subjective:    CC: HTN, IFG  HPI:  Hypertension- Pt denies chest pain, SOB, dizziness, or heart palpitations.  Taking meds as directed w/o problems.  Denies medication side effects.    Impaired fasting glucose:  + increased thirst or urination. No symptoms consistent with hypoglycemia.  Obstructive sleep apnea-she is doing well.  She is on CPAP set at 7 7 cm water pressure.  She more recently got a new machine and that seems to be working well.  Past medical history, Surgical history, Family history not pertinant except as noted below, Social history, Allergies, and medications have been entered into the medical record, reviewed, and corrections made.   Review of Systems: No fevers, chills, night sweats, weight loss, chest pain, or shortness of breath.   Objective:    General: Well Developed, well nourished, and in no acute distress.  Neuro: Alert and oriented x3, extra-ocular muscles intact, sensation grossly intact.  HEENT: Normocephalic, atraumatic  Skin: Warm and dry, no rashes. Cardiac: Regular rate and rhythm, no murmurs rubs or gallops, no lower extremity edema.  Respiratory: Clear to auscultation bilaterally. Not using accessory muscles, speaking in full sentences.   Impression and Recommendations:    HTN - Well controlled. Continue current regimen. Follow up in  6 monthh.   Diabetes mellitus, uncontrolled-new diagnosis.  Hemoglobin A1c jumped to 11.8.  We discussed major dietary changes.  She says more recently she actually has cut back on sugar intake.  But she is also been drinking pineapple juice and we discussed cutting that out in addition to getting more regular exercise and really starting to portion control her carb intake.  OSA -well on current settings of CPAP.

## 2018-12-11 NOTE — Assessment & Plan Note (Signed)
CPAP 7 cm water pressure.

## 2018-12-12 ENCOUNTER — Other Ambulatory Visit: Payer: Self-pay | Admitting: *Deleted

## 2018-12-12 DIAGNOSIS — I10 Essential (primary) hypertension: Secondary | ICD-10-CM

## 2018-12-12 DIAGNOSIS — E1165 Type 2 diabetes mellitus with hyperglycemia: Secondary | ICD-10-CM

## 2018-12-12 LAB — BASIC METABOLIC PANEL WITH GFR
BUN: 15 mg/dL (ref 7–25)
CO2: 32 mmol/L (ref 20–32)
Calcium: 10.1 mg/dL (ref 8.6–10.4)
Chloride: 96 mmol/L — ABNORMAL LOW (ref 98–110)
Creat: 1 mg/dL (ref 0.50–1.05)
GFR, Est African American: 72 mL/min/{1.73_m2} (ref >=60)
GFR, Est Non African American: 62 mL/min/{1.73_m2} (ref >=60)
GLUCOSE: 351 mg/dL — AB (ref 65–99)
POTASSIUM: 3.7 mmol/L (ref 3.5–5.3)
SODIUM: 135 mmol/L (ref 135–146)

## 2018-12-12 MED ORDER — AMLODIPINE BESYLATE 5 MG PO TABS: 5.0000 mg | ORAL_TABLET | Freq: Every day | ORAL | 1 refills | Status: DC

## 2018-12-12 MED ORDER — BLOOD GLUCOSE METER KIT: PACK | 0 refills | Status: AC

## 2018-12-28 LAB — HM DIABETES EYE EXAM

## 2018-12-29 ENCOUNTER — Other Ambulatory Visit: Payer: Self-pay | Admitting: Family Medicine

## 2019-01-03 ENCOUNTER — Other Ambulatory Visit: Payer: Self-pay | Admitting: Family Medicine

## 2019-01-18 ENCOUNTER — Encounter: Payer: Self-pay | Admitting: Family Medicine

## 2019-01-25 ENCOUNTER — Other Ambulatory Visit: Payer: Self-pay | Admitting: Family Medicine

## 2019-02-01 ENCOUNTER — Other Ambulatory Visit: Payer: Self-pay | Admitting: Family Medicine

## 2019-02-04 ENCOUNTER — Other Ambulatory Visit: Payer: Self-pay | Admitting: *Deleted

## 2019-02-04 DIAGNOSIS — E1165 Type 2 diabetes mellitus with hyperglycemia: Secondary | ICD-10-CM

## 2019-02-04 MED ORDER — GLUCOSE BLOOD VI STRP: ORAL_STRIP | 3 refills | Status: AC

## 2019-02-15 ENCOUNTER — Other Ambulatory Visit: Payer: Self-pay | Admitting: Neurology

## 2019-02-15 DIAGNOSIS — I10 Essential (primary) hypertension: Secondary | ICD-10-CM

## 2019-02-15 MED ORDER — VALSARTAN-HYDROCHLOROTHIAZIDE 320-25 MG PO TABS: 1.0000 | ORAL_TABLET | Freq: Every day | ORAL | 1 refills | Status: DC

## 2019-02-16 ENCOUNTER — Other Ambulatory Visit: Payer: Self-pay | Admitting: Family Medicine

## 2019-02-16 DIAGNOSIS — I10 Essential (primary) hypertension: Secondary | ICD-10-CM

## 2019-03-19 ENCOUNTER — Ambulatory Visit (INDEPENDENT_AMBULATORY_CARE_PROVIDER_SITE_OTHER): Payer: BC Managed Care – PPO | Admitting: Family Medicine

## 2019-03-19 ENCOUNTER — Encounter: Payer: Self-pay | Admitting: Family Medicine

## 2019-03-19 VITALS — BP 119/67 | HR 76 | Ht 60.0 in | Wt 195.0 lb

## 2019-03-19 DIAGNOSIS — G4733 Obstructive sleep apnea (adult) (pediatric): Secondary | ICD-10-CM

## 2019-03-19 DIAGNOSIS — E119 Type 2 diabetes mellitus without complications: Secondary | ICD-10-CM | POA: Diagnosis not present

## 2019-03-19 DIAGNOSIS — I10 Essential (primary) hypertension: Secondary | ICD-10-CM

## 2019-03-19 DIAGNOSIS — E1165 Type 2 diabetes mellitus with hyperglycemia: Secondary | ICD-10-CM

## 2019-03-19 DIAGNOSIS — Z6835 Body mass index (BMI) 35.0-35.9, adult: Secondary | ICD-10-CM

## 2019-03-19 DIAGNOSIS — Z23 Encounter for immunization: Secondary | ICD-10-CM

## 2019-03-19 LAB — POCT GLYCOSYLATED HEMOGLOBIN (HGB A1C): Hemoglobin A1C: 5.7 % — AB (ref 4.0–5.6)

## 2019-03-19 NOTE — Assessment & Plan Note (Signed)
Well controlled. Continue current regimen. Follow up in  3 months.  

## 2019-03-19 NOTE — Assessment & Plan Note (Signed)
She has done a great job with weight loss so far.  Just keep up the good work.

## 2019-03-19 NOTE — Assessment & Plan Note (Signed)
Doing well on CPAP.  Continue current regimen.

## 2019-03-19 NOTE — Assessment & Plan Note (Signed)
She is doing a great job.  In fact at her follow-up next month if her A1c looks just is good then we can probably discontinue the Januvia and just continue metformin.  Continue to work on healthy diet and regular exercise and weight loss.

## 2019-03-19 NOTE — Progress Notes (Signed)
Established Patient Office Visit  Subjective:  Patient ID: Loretta Bullock, female    DOB: 1961-05-14  Age: 58 y.o. MRN: [id number]  CC:  Chief Complaint  Patient presents with  . Diabetes    HPI Loretta Bullock presents for  Hypertension- Pt denies chest pain, SOB, dizziness, or heart palpitations.  Taking meds as directed w/o problems.  Denies medication side effects.    Diabetes - no hypoglycemic events. No wounds or sores that are not healing well. No increased thirst or urination. Checking glucose at home. Taking medications as prescribed without any side effects.  OSA -she is doing well with her CPAP.  No problems with concerns.  She is been using it regularly.  Currently set to 7 cm of water pressure.  Past Medical History:  Diagnosis Date  . Anxiety   . DDD (degenerative disc disease), cervical   . History of lumbar laminectomy   . Hypertension   . PONV (postoperative nausea and vomiting)   . Sleep apnea    CPAP 7 cm water    Past Surgical History:  Procedure Laterality Date  . BACK SURGERY    . CESAREAN SECTION  2002  . LUMBAR DISC SURGERY  06/2013   Dr. Elliot Dally  . LUMBAR FUSION  08/17/2017  . MAXIMUM ACCESS (MAS)POSTERIOR LUMBAR INTERBODY FUSION (PLIF) 1 LEVEL  2016    Family History  Problem Relation Age of Onset  . Hyperlipidemia Father   . Hyperlipidemia Mother   . Hyperlipidemia Brother   . Diabetes Maternal Grandmother   . Colon cancer Maternal Aunt     Social History   Socioeconomic History  . Marital status: Married    Spouse name: Not on file  . Number of children: Not on file  . Years of education: Not on file  . Highest education level: Not on file  Occupational History  . Not on file  Social Needs  . Financial resource strain: Not on file  . Food insecurity    Worry: Not on file    Inability: Not on file  . Transportation needs    Medical: Not on file    Non-medical: Not on file  Tobacco Use  . Smoking status: Former  Research scientist (life sciences)  . Smokeless tobacco: Never Used  Substance and Sexual Activity  . Alcohol use: Yes    Comment: occ  . Drug use: No  . Sexual activity: Yes    Birth control/protection: Post-menopausal  Lifestyle  . Physical activity    Days per week: Not on file    Minutes per session: Not on file  . Stress: Not on file  Relationships  . Social Herbalist on phone: Not on file    Gets together: Not on file    Attends religious service: Not on file    Active member of club or organization: Not on file    Attends meetings of clubs or organizations: Not on file    Relationship status: Not on file  . Intimate partner violence    Fear of current or ex partner: Not on file    Emotionally abused: Not on file    Physically abused: Not on file    Forced sexual activity: Not on file  Other Topics Concern  . Not on file  Social History Narrative  . Not on file    Outpatient Medications Prior to Visit  Medication Sig Dispense Refill  . Accu-Chek FastClix Lancets MISC USE 4 TIMES A DAY 102 each  prn  . acetaminophen (TYLENOL) 500 MG tablet Take 1,000 mg every 6 (six) hours as needed by mouth for moderate pain or headache.    . AMBULATORY NON FORMULARY MEDICATION Medication Name: CPAP  set to 7 cm of water. Please fit for new facemask with tubing. Humidifier. Diagnosis moderate obstructive sleep apnea. Please see sleep study from 01/27/2005. 1 vial 0  . amLODipine (NORVASC) 5 MG tablet Take 1 tablet (5 mg total) by mouth daily. 90 tablet 1  . blood glucose meter kit and supplies Dispense based on patient and insurance preference. Use up to four times daily as directed. Dx:E11.65. 1 each 0  . Choline Fenofibrate (FENOFIBRIC ACID) 135 MG CPDR TAKE 1 CAPSULE EVERY DAY 90 capsule 3  . estradiol (VIVELLE-DOT) 0.05 MG/24HR patch Place 1 patch (0.05 mg total) onto the skin 2 (two) times a week. 24 patch 1  . fexofenadine (ALLEGRA) 180 MG tablet Take 180 mg by mouth daily.    . furosemide (LASIX)  20 MG tablet Take 1 tablet (20 mg total) by mouth daily as needed for edema. 30 tablet 1  . glucose blood (ACCU-CHEK GUIDE) test strip To be used for testing up to 4 times daily. Dx:E11.65 400 each 3  . JANUMET XR 50-500 MG TB24 TAKE 1 TABLET BY MOUTH EVERY DAY 90 tablet 1  . Ketotifen Fumarate (ALAWAY OP) Place 1 drop 2 (two) times daily into both eyes.    . Multiple Vitamin (MULTIVITAMIN) capsule Take 1 capsule by mouth daily.      Marland Kitchen omega-3 acid ethyl esters (LOVAZA) 1 g capsule TAKE 1 CAPSULE (1 G TOTAL) BY MOUTH 2 (TWO) TIMES DAILY. 180 capsule 3  . progesterone (PROMETRIUM) 200 MG capsule Take 1 capsule (200 mg total) by mouth See admin instructions. Take 200 mg by mouth daily for 2 weeks every other month 14 capsule 6  . valsartan-hydrochlorothiazide (DIOVAN-HCT) 320-25 MG tablet TAKE 1 TABLET BY MOUTH EVERY DAY 90 tablet 1  . venlafaxine XR (EFFEXOR-XR) 75 MG 24 hr capsule Take 1 capsule (75 mg total) by mouth daily. 90 capsule 2  . naproxen sodium (ALEVE) 220 MG tablet Take 220 mg 2 (two) times daily as needed by mouth (for pain or headache).     No facility-administered medications prior to visit.     No Known Allergies  ROS Review of Systems    Objective:    Physical Exam  Constitutional: She is oriented to person, place, and time. She appears well-developed and well-nourished.  HENT:  Head: Normocephalic and atraumatic.  Cardiovascular: Normal rate, regular rhythm and normal heart sounds.  Pulmonary/Chest: Effort normal and breath sounds normal.  Neurological: She is alert and oriented to person, place, and time.  Skin: Skin is warm and dry.  Psychiatric: She has a normal mood and affect. Her behavior is normal.    BP 119/67   Pulse 76   Ht 5' (1.524 m)   Wt 195 lb (88.5 kg)   LMP 12/21/2012   SpO2 99%   BMI 38.08 kg/m  Wt Readings from Last 3 Encounters:  03/19/19 195 lb (88.5 kg)  12/11/18 201 lb (91.2 kg)  08/23/18 211 lb (95.7 kg)     Health Maintenance  Due  Topic Date Due  . PNEUMOCOCCAL POLYSACCHARIDE VACCINE AGE 45-64 HIGH RISK  03/28/1963  . PAP SMEAR-Modifier  04/08/2019    There are no preventive care reminders to display for this patient.  Lab Results  Component Value Date   TSH 4.14  04/16/2016   Lab Results  Component Value Date   WBC 3.9 (L) 08/15/2017   HGB 14.1 08/15/2017   HCT 40.9 08/15/2017   MCV 85.4 08/15/2017   PLT 236 08/15/2017   Lab Results  Component Value Date   NA 135 12/11/2018   K 3.7 12/11/2018   CO2 32 12/11/2018   GLUCOSE 351 (H) 12/11/2018   BUN 15 12/11/2018   CREATININE 1.00 12/11/2018   BILITOT 0.5 07/17/2018   ALKPHOS 33 01/20/2017   AST 34 07/17/2018   ALT 36 (H) 07/17/2018   PROT 6.7 07/17/2018   ALBUMIN 5.1 01/20/2017   CALCIUM 10.1 12/11/2018   ANIONGAP 8 08/15/2017   Lab Results  Component Value Date   CHOL 179 07/17/2018   Lab Results  Component Value Date   HDL 39 (L) 07/17/2018   Lab Results  Component Value Date   LDLCALC 113 (H) 07/17/2018   Lab Results  Component Value Date   TRIG 156 (H) 07/17/2018   Lab Results  Component Value Date   CHOLHDL 4.6 07/17/2018   Lab Results  Component Value Date   HGBA1C 5.7 (A) 03/19/2019      Assessment & Plan:   Problem List Items Addressed This Visit      Cardiovascular and Mediastinum   HYPERTENSION, BENIGN    Well controlled. Continue current regimen. Follow up in  3 months.         Respiratory   Sleep apnea    Doing well on CPAP.  Continue current regimen.        Endocrine   Controlled type 2 diabetes mellitus without complication, without long-term current use of insulin ([place]) - Primary    She is doing a great job.  In fact at her follow-up next month if her A1c looks just is good then we can probably discontinue the Januvia and just continue metformin.  Continue to work on healthy diet and regular exercise and weight loss.        Other   Severe obesity (BMI 35.0-35.9 with comorbidity) ([place])     She has done a great job with weight loss so far.  Just keep up the good work.         Other Visit Diagnoses    Need for prophylactic vaccination against Streptococcus pneumoniae (pneumococcus)       Relevant Orders   Pneumococcal polysaccharide vaccine 23-valent greater than or equal to 2yo subcutaneous/IM (Completed)      No orders of the defined types were placed in this encounter.   Follow-up: Return in about 3 months (around 06/19/2019) for Diabetes follow-up.    Beatrice Lecher, MD

## 2019-03-22 ENCOUNTER — Other Ambulatory Visit: Payer: Self-pay | Admitting: Obstetrics & Gynecology

## 2019-06-01 ENCOUNTER — Other Ambulatory Visit: Payer: Self-pay | Admitting: Family Medicine

## 2019-06-01 DIAGNOSIS — I10 Essential (primary) hypertension: Secondary | ICD-10-CM

## 2019-06-18 ENCOUNTER — Ambulatory Visit (INDEPENDENT_AMBULATORY_CARE_PROVIDER_SITE_OTHER): Payer: BC Managed Care – PPO | Admitting: Family Medicine

## 2019-06-18 ENCOUNTER — Encounter: Payer: Self-pay | Admitting: Family Medicine

## 2019-06-18 ENCOUNTER — Other Ambulatory Visit: Payer: Self-pay

## 2019-06-18 VITALS — BP 124/68 | HR 74 | Ht 60.0 in | Wt 200.0 lb

## 2019-06-18 DIAGNOSIS — E039 Hypothyroidism, unspecified: Secondary | ICD-10-CM

## 2019-06-18 DIAGNOSIS — E119 Type 2 diabetes mellitus without complications: Secondary | ICD-10-CM | POA: Diagnosis not present

## 2019-06-18 DIAGNOSIS — E785 Hyperlipidemia, unspecified: Secondary | ICD-10-CM | POA: Diagnosis not present

## 2019-06-18 DIAGNOSIS — I10 Essential (primary) hypertension: Secondary | ICD-10-CM

## 2019-06-18 DIAGNOSIS — Z23 Encounter for immunization: Secondary | ICD-10-CM | POA: Diagnosis not present

## 2019-06-18 DIAGNOSIS — E038 Other specified hypothyroidism: Secondary | ICD-10-CM

## 2019-06-18 LAB — POCT GLYCOSYLATED HEMOGLOBIN (HGB A1C): Hemoglobin A1C: 5.5 % (ref 4.0–5.6)

## 2019-06-18 MED ORDER — METFORMIN HCL 500 MG PO TABS: 500.0000 mg | ORAL_TABLET | Freq: Two times a day (BID) | ORAL | 1 refills | Status: DC

## 2019-06-18 NOTE — Assessment & Plan Note (Signed)
Due to recheck TSH. 

## 2019-06-18 NOTE — Assessment & Plan Note (Signed)
Well controlled. Continue current regimen. Follow up in  6 mo  

## 2019-06-18 NOTE — Progress Notes (Signed)
Established Patient Office Visit  Subjective:  Patient ID: Loretta Bullock, female    DOB: Oct 09, 1960  Age: 58 y.o. MRN: [id number]  CC:  Chief Complaint  Patient presents with  . Diabetes    HPI Loretta Bullock presents for   Diabetes - no hypoglycemic events. No wounds or sores that are not healing well. No increased thirst or urination. Checking glucose at home. Taking medications as prescribed without any side effects.  Hypertension- Pt denies chest pain, SOB, dizziness, or heart palpitations.  Taking meds as directed w/o problems.  Denies medication side effects.    F/U subclinical hypothyroidism. She is asymptomatic.   Lab Results  Component Value Date   TSH 4.14 04/16/2016     Hyperlipidemia - not currently on a statin.   Lab Results  Component Value Date   CHOL 179 07/17/2018   CHOL 213 (H) 01/20/2017   CHOL 174 04/16/2016   Lab Results  Component Value Date   HDL 39 (L) 07/17/2018   HDL 49 (L) 01/20/2017   HDL 39 (L) 04/16/2016   Lab Results  Component Value Date   LDLCALC 113 (H) 07/17/2018   LDLCALC 138 (H) 01/20/2017   LDLCALC 96 04/16/2016   Lab Results  Component Value Date   TRIG 156 (H) 07/17/2018   TRIG 132 01/20/2017   TRIG 197 (H) 04/16/2016   Lab Results  Component Value Date   CHOLHDL 4.6 07/17/2018   CHOLHDL 4.3 01/20/2017   CHOLHDL 4.5 04/16/2016   No results found for: LDLDIRECT     Past Medical History:  Diagnosis Date  . Anxiety   . DDD (degenerative disc disease), cervical   . History of lumbar laminectomy   . Hypertension   . PONV (postoperative nausea and vomiting)   . Sleep apnea    CPAP 7 cm water    Past Surgical History:  Procedure Laterality Date  . BACK SURGERY    . CESAREAN SECTION  2002  . LUMBAR DISC SURGERY  06/2013   Dr. Elliot Dally  . LUMBAR FUSION  08/17/2017  . MAXIMUM ACCESS (MAS)POSTERIOR LUMBAR INTERBODY FUSION (PLIF) 1 LEVEL  2016    Family History  Problem Relation Age of Onset  .  Hyperlipidemia Father   . Hyperlipidemia Mother   . Hyperlipidemia Brother   . Diabetes Maternal Grandmother   . Colon cancer Maternal Aunt     Social History   Socioeconomic History  . Marital status: Married    Spouse name: Not on file  . Number of children: Not on file  . Years of education: Not on file  . Highest education level: Not on file  Occupational History  . Not on file  Social Needs  . Financial resource strain: Not on file  . Food insecurity    Worry: Not on file    Inability: Not on file  . Transportation needs    Medical: Not on file    Non-medical: Not on file  Tobacco Use  . Smoking status: Former Research scientist (life sciences)  . Smokeless tobacco: Never Used  Substance and Sexual Activity  . Alcohol use: Yes    Comment: occ  . Drug use: No  . Sexual activity: Yes    Birth control/protection: Post-menopausal  Lifestyle  . Physical activity    Days per week: Not on file    Minutes per session: Not on file  . Stress: Not on file  Relationships  . Social connections    Talks on phone: Not on file  Gets together: Not on file    Attends religious service: Not on file    Active member of club or organization: Not on file    Attends meetings of clubs or organizations: Not on file    Relationship status: Not on file  . Intimate partner violence    Fear of current or ex partner: Not on file    Emotionally abused: Not on file    Physically abused: Not on file    Forced sexual activity: Not on file  Other Topics Concern  . Not on file  Social History Narrative  . Not on file    Outpatient Medications Prior to Visit  Medication Sig Dispense Refill  . Accu-Chek FastClix Lancets MISC USE 4 TIMES A DAY 102 each prn  . acetaminophen (TYLENOL) 500 MG tablet Take 1,000 mg every 6 (six) hours as needed by mouth for moderate pain or headache.    . AMBULATORY NON FORMULARY MEDICATION Medication Name: CPAP  set to 7 cm of water. Please fit for new facemask with tubing.  Humidifier. Diagnosis moderate obstructive sleep apnea. Please see sleep study from 01/27/2005. 1 vial 0  . amLODipine (NORVASC) 5 MG tablet TAKE 1 TABLET BY MOUTH EVERY DAY 90 tablet 1  . blood glucose meter kit and supplies Dispense based on patient and insurance preference. Use up to four times daily as directed. Dx:E11.65. 1 each 0  . Choline Fenofibrate (FENOFIBRIC ACID) 135 MG CPDR TAKE 1 CAPSULE EVERY DAY 90 capsule 3  . estradiol (VIVELLE-DOT) 0.05 MG/24HR patch Place 1 patch (0.05 mg total) onto the skin 2 (two) times a week. 24 patch 1  . fexofenadine (ALLEGRA) 180 MG tablet Take 180 mg by mouth daily.    . furosemide (LASIX) 20 MG tablet Take 1 tablet (20 mg total) by mouth daily as needed for edema. 30 tablet 1  . glucose blood (ACCU-CHEK GUIDE) test strip To be used for testing up to 4 times daily. Dx:E11.65 400 each 3  . Ketotifen Fumarate (ALAWAY OP) Place 1 drop 2 (two) times daily into both eyes.    . Multiple Vitamin (MULTIVITAMIN) capsule Take 1 capsule by mouth daily.      Marland Kitchen omega-3 acid ethyl esters (LOVAZA) 1 g capsule TAKE 1 CAPSULE (1 G TOTAL) BY MOUTH 2 (TWO) TIMES DAILY. 180 capsule 3  . progesterone (PROMETRIUM) 200 MG capsule Take 1 capsule (200 mg total) by mouth See admin instructions. Take 200 mg by mouth daily for 2 weeks every other month 14 capsule 6  . valsartan-hydrochlorothiazide (DIOVAN-HCT) 320-25 MG tablet TAKE 1 TABLET BY MOUTH EVERY DAY 90 tablet 1  . venlafaxine XR (EFFEXOR-XR) 75 MG 24 hr capsule Take 1 capsule (75 mg total) by mouth daily. 90 capsule 2  . JANUMET XR 50-500 MG TB24 TAKE 1 TABLET BY MOUTH EVERY DAY 90 tablet 1   No facility-administered medications prior to visit.     No Known Allergies  ROS Review of Systems    Objective:    Physical Exam  Constitutional: She is oriented to person, place, and time. She appears well-developed and well-nourished.  HENT:  Head: Normocephalic and atraumatic.  Cardiovascular: Normal rate, regular  rhythm and normal heart sounds.  Pulmonary/Chest: Effort normal and breath sounds normal.  Neurological: She is alert and oriented to person, place, and time.  Skin: Skin is warm and dry.  Psychiatric: She has a normal mood and affect. Her behavior is normal.    BP 124/68   Pulse  74   Ht 5' (1.524 m)   Wt 200 lb (90.7 kg)   LMP 12/21/2012   SpO2 100%   BMI 39.06 kg/m  Wt Readings from Last 3 Encounters:  06/18/19 200 lb (90.7 kg)  03/19/19 195 lb (88.5 kg)  12/11/18 201 lb (91.2 kg)     There are no preventive care reminders to display for this patient.  There are no preventive care reminders to display for this patient.  Lab Results  Component Value Date   TSH 4.14 04/16/2016   Lab Results  Component Value Date   WBC 3.9 (L) 08/15/2017   HGB 14.1 08/15/2017   HCT 40.9 08/15/2017   MCV 85.4 08/15/2017   PLT 236 08/15/2017   Lab Results  Component Value Date   NA 135 12/11/2018   K 3.7 12/11/2018   CO2 32 12/11/2018   GLUCOSE 351 (H) 12/11/2018   BUN 15 12/11/2018   CREATININE 1.00 12/11/2018   BILITOT 0.5 07/17/2018   ALKPHOS 33 01/20/2017   AST 34 07/17/2018   ALT 36 (H) 07/17/2018   PROT 6.7 07/17/2018   ALBUMIN 5.1 01/20/2017   CALCIUM 10.1 12/11/2018   ANIONGAP 8 08/15/2017   Lab Results  Component Value Date   CHOL 179 07/17/2018   Lab Results  Component Value Date   HDL 39 (L) 07/17/2018   Lab Results  Component Value Date   LDLCALC 113 (H) 07/17/2018   Lab Results  Component Value Date   TRIG 156 (H) 07/17/2018   Lab Results  Component Value Date   CHOLHDL 4.6 07/17/2018   Lab Results  Component Value Date   HGBA1C 5.5 06/18/2019      Assessment & Plan:   Problem List Items Addressed This Visit      Cardiovascular and Mediastinum   HYPERTENSION, BENIGN - Primary    Well controlled. Continue current regimen. Follow up in  6 mo.         Relevant Orders   COMPLETE METABOLIC PANEL WITH GFR   Lipid panel   TSH      Endocrine   Subclinical hypothyroidism    Due to recheck TSH.       Relevant Orders   COMPLETE METABOLIC PANEL WITH GFR   Lipid panel   TSH   Controlled type 2 diabetes mellitus without complication, without long-term current use of insulin ([facility])    Well controlled.  Will discontinue Januvia and continue metformin by itself since her A1c was actually 5.5 today.  Continue current regimen. Follow up in  3 mo. still working on continuing to lose weight.      Relevant Medications   metFORMIN (GLUCOPHAGE) 500 MG tablet   Other Relevant Orders   COMPLETE METABOLIC PANEL WITH GFR   Lipid panel   TSH   POCT HgB A1C (Completed)     Other   Hyperlipidemia    We did discuss starting a statin and reviewed current guidelines recommending that all diabetics be on a statin regardless of their actual LDL number.  She said to give it some thought.  She really hesitates to add another medication to her regimen.  We did discuss potential side effects.  She says she will think about it and let me know      Relevant Orders   COMPLETE METABOLIC PANEL WITH GFR   Lipid panel   TSH    Other Visit Diagnoses    Need for immunization against influenza  Relevant Orders   Flu Vaccine QUAD 36+ mos IM (Completed)      Meds ordered this encounter  Medications  . metFORMIN (GLUCOPHAGE) 500 MG tablet    Sig: Take 1 tablet (500 mg total) by mouth 2 (two) times daily with a meal.    Dispense:  180 tablet    Refill:  1    Follow-up: Return in about 3 months (around 09/17/2019).    Beatrice Lecher, MD

## 2019-06-18 NOTE — Assessment & Plan Note (Signed)
We did discuss starting a statin and reviewed current guidelines recommending that all diabetics be on a statin regardless of their actual LDL number.  She said to give it some thought.  She really hesitates to add another medication to her regimen.  We did discuss potential side effects.  She says she will think about it and let me know

## 2019-06-18 NOTE — Assessment & Plan Note (Addendum)
Well controlled.  Will discontinue Januvia and continue metformin by itself since her A1c was actually 5.5 today.  Continue current regimen. Follow up in  3 mo. still working on continuing to lose weight.

## 2019-06-19 LAB — COMPLETE METABOLIC PANEL WITH GFR
AG Ratio: 1.8 (calc) (ref 1.0–2.5)
ALT: 20 U/L (ref 6–29)
AST: 21 U/L (ref 10–35)
Albumin: 4.6 g/dL (ref 3.6–5.1)
Alkaline phosphatase (APISO): 23 U/L — ABNORMAL LOW (ref 37–153)
BUN: 20 mg/dL (ref 7–25)
CO2: 28 mmol/L (ref 20–32)
Calcium: 9.9 mg/dL (ref 8.6–10.4)
Chloride: 103 mmol/L (ref 98–110)
Creat: 0.78 mg/dL (ref 0.50–1.05)
GFR, Est African American: 97 mL/min/{1.73_m2} (ref >=60)
GFR, Est Non African American: 84 mL/min/{1.73_m2} (ref >=60)
Globulin: 2.5 g/dL (calc) (ref 1.9–3.7)
Glucose, Bld: 108 mg/dL — ABNORMAL HIGH (ref 65–99)
Potassium: 3.9 mmol/L (ref 3.5–5.3)
Sodium: 140 mmol/L (ref 135–146)
Total Bilirubin: 0.4 mg/dL (ref 0.2–1.2)
Total Protein: 7.1 g/dL (ref 6.1–8.1)

## 2019-06-19 LAB — LIPID PANEL
Cholesterol: 164 mg/dL (ref <200)
HDL: 40 mg/dL — ABNORMAL LOW (ref >=50)
LDL Cholesterol (Calc): 101 mg/dL (calc) — ABNORMAL HIGH
Non-HDL Cholesterol (Calc): 124 mg/dL (calc) (ref <130)
Total CHOL/HDL Ratio: 4.1 (calc) (ref <5.0)
Triglycerides: 125 mg/dL (ref <150)

## 2019-06-19 LAB — TSH: TSH: 4.31 mIU/L (ref 0.40–4.50)

## 2019-07-05 ENCOUNTER — Other Ambulatory Visit: Payer: Self-pay | Admitting: Obstetrics & Gynecology

## 2019-07-20 ENCOUNTER — Ambulatory Visit (INDEPENDENT_AMBULATORY_CARE_PROVIDER_SITE_OTHER): Payer: BC Managed Care – PPO | Admitting: Family Medicine

## 2019-07-20 ENCOUNTER — Encounter: Payer: Self-pay | Admitting: Family Medicine

## 2019-07-20 VITALS — Temp 97.0°F

## 2019-07-20 DIAGNOSIS — Z1231 Encounter for screening mammogram for malignant neoplasm of breast: Secondary | ICD-10-CM

## 2019-07-20 DIAGNOSIS — R232 Flushing: Secondary | ICD-10-CM

## 2019-07-20 DIAGNOSIS — F419 Anxiety disorder, unspecified: Secondary | ICD-10-CM | POA: Diagnosis not present

## 2019-07-20 MED ORDER — VENLAFAXINE HCL ER 75 MG PO CP24: ORAL_CAPSULE | ORAL | 1 refills | Status: DC

## 2019-07-20 MED ORDER — ESTRADIOL 0.05 MG/24HR TD PTTW: 1.0000 | MEDICATED_PATCH | TRANSDERMAL | 1 refills | Status: DC

## 2019-07-20 NOTE — Assessment & Plan Note (Signed)
For anxiety and irritability-GAD-7 score of 7 today.  Will restart her Effexor.  New prescription sent to pharmacy.  Follow-up at appointment in January.

## 2019-07-20 NOTE — Progress Notes (Signed)
Virtual Visit via Video Note  I connected with [name] on 07/23/19 at  3:20 PM EDT by a video enabled telemedicine application and verified that I am speaking with the correct person using two identifiers.   I discussed the limitations of evaluation and management by telemedicine and the availability of in person appointments. The patient expressed understanding and agreed to proceed.     Established Patient Office Visit  Subjective:  Patient ID: Loretta Bullock, female    DOB: 1961/03/02  Age: 58 y.o. MRN: [id number]  CC:  Chief Complaint  Patient presents with  . mood  . Hot Flashes    HPI Inika Bellanger presents for anxiety.  She reports feeling nervous and on edge nearly every day.  She also reports high levels of irritability.  Rates her symptoms as somewhat difficult.  She says that she has been on Effexor for years.  Initially it was started for hot flashes as well as just some mood irritability issues.  She just been having problems getting it from her OB/GYN who normally writes it for her.  She is currently on 75 mg capsule daily.  In fact she has been out for the last 4 days.  She went for her regular exam last December for her checkup.  For hot flashes she also uses the Vivelle dot.  She would like a refill on that if possible as well.  She is actually due for her Pap smear in December but we are not scheduling physicals December.  She is willing to come in in January.  Past Medical History:  Diagnosis Date  . Anxiety   . DDD (degenerative disc disease), cervical   . History of lumbar laminectomy   . Hypertension   . PONV (postoperative nausea and vomiting)   . Sleep apnea    CPAP 7 cm water    Past Surgical History:  Procedure Laterality Date  . BACK SURGERY    . CESAREAN SECTION  2002  . LUMBAR DISC SURGERY  06/2013   Dr. Elliot Dally  . LUMBAR FUSION  08/17/2017  . MAXIMUM ACCESS (MAS)POSTERIOR LUMBAR INTERBODY FUSION (PLIF) 1 LEVEL  2016    Family  History  Problem Relation Age of Onset  . Hyperlipidemia Father   . Hyperlipidemia Mother   . Hyperlipidemia Brother   . Diabetes Maternal Grandmother   . Colon cancer Maternal Aunt     Social History   Socioeconomic History  . Marital status: Married    Spouse name: Not on file  . Number of children: Not on file  . Years of education: Not on file  . Highest education level: Not on file  Occupational History  . Not on file  Social Needs  . Financial resource strain: Not on file  . Food insecurity    Worry: Not on file    Inability: Not on file  . Transportation needs    Medical: Not on file    Non-medical: Not on file  Tobacco Use  . Smoking status: Former Research scientist (life sciences)  . Smokeless tobacco: Never Used  Substance and Sexual Activity  . Alcohol use: Yes    Comment: occ  . Drug use: No  . Sexual activity: Yes    Birth control/protection: Post-menopausal  Lifestyle  . Physical activity    Days per week: Not on file    Minutes per session: Not on file  . Stress: Not on file  Relationships  . Social Herbalist on phone: Not  on file    Gets together: Not on file    Attends religious service: Not on file    Active member of club or organization: Not on file    Attends meetings of clubs or organizations: Not on file    Relationship status: Not on file  . Intimate partner violence    Fear of current or ex partner: Not on file    Emotionally abused: Not on file    Physically abused: Not on file    Forced sexual activity: Not on file  Other Topics Concern  . Not on file  Social History Narrative  . Not on file    Outpatient Medications Prior to Visit  Medication Sig Dispense Refill  . Accu-Chek FastClix Lancets MISC USE 4 TIMES A DAY 102 each prn  . acetaminophen (TYLENOL) 500 MG tablet Take 1,000 mg every 6 (six) hours as needed by mouth for moderate pain or headache.    . AMBULATORY NON FORMULARY MEDICATION Medication Name: CPAP  set to 7 cm of water. Please  fit for new facemask with tubing. Humidifier. Diagnosis moderate obstructive sleep apnea. Please see sleep study from 01/27/2005. 1 vial 0  . amLODipine (NORVASC) 5 MG tablet TAKE 1 TABLET BY MOUTH EVERY DAY 90 tablet 1  . blood glucose meter kit and supplies Dispense based on patient and insurance preference. Use up to four times daily as directed. Dx:E11.65. 1 each 0  . Choline Fenofibrate (FENOFIBRIC ACID) 135 MG CPDR TAKE 1 CAPSULE EVERY DAY 90 capsule 3  . fexofenadine (ALLEGRA) 180 MG tablet Take 180 mg by mouth daily.    . furosemide (LASIX) 20 MG tablet Take 1 tablet (20 mg total) by mouth daily as needed for edema. 30 tablet 1  . glucose blood (ACCU-CHEK GUIDE) test strip To be used for testing up to 4 times daily. Dx:E11.65 400 each 3  . Ketotifen Fumarate (ALAWAY OP) Place 1 drop 2 (two) times daily into both eyes.    . metFORMIN (GLUCOPHAGE) 500 MG tablet Take 1 tablet (500 mg total) by mouth 2 (two) times daily with a meal. 180 tablet 1  . Multiple Vitamin (MULTIVITAMIN) capsule Take 1 capsule by mouth daily.      Marland Kitchen omega-3 acid ethyl esters (LOVAZA) 1 g capsule TAKE 1 CAPSULE (1 G TOTAL) BY MOUTH 2 (TWO) TIMES DAILY. 180 capsule 3  . progesterone (PROMETRIUM) 200 MG capsule Take 1 capsule (200 mg total) by mouth See admin instructions. Take 200 mg by mouth daily for 2 weeks every other month 14 capsule 6  . valsartan-hydrochlorothiazide (DIOVAN-HCT) 320-25 MG tablet TAKE 1 TABLET BY MOUTH EVERY DAY 90 tablet 1  . estradiol (VIVELLE-DOT) 0.05 MG/24HR patch Place 1 patch (0.05 mg total) onto the skin 2 (two) times a week. 24 patch 1  . venlafaxine XR (EFFEXOR-XR) 75 MG 24 hr capsule Take 1 capsule (75 mg total) by mouth daily. 90 capsule 2   No facility-administered medications prior to visit.     No Known Allergies  ROS Review of Systems    Objective:    Physical Exam  Temp (!) 97 F (36.1 C)   LMP 12/21/2012  Wt Readings from Last 3 Encounters:  06/18/19 200 lb (90.7  kg)  03/19/19 195 lb (88.5 kg)  12/11/18 201 lb (91.2 kg)     There are no preventive care reminders to display for this patient.  There are no preventive care reminders to display for this patient.  Lab Results  Component Value Date   TSH 4.31 06/18/2019   Lab Results  Component Value Date   WBC 3.9 (L) 08/15/2017   HGB 14.1 08/15/2017   HCT 40.9 08/15/2017   MCV 85.4 08/15/2017   PLT 236 08/15/2017   Lab Results  Component Value Date   NA 140 06/18/2019   K 3.9 06/18/2019   CO2 28 06/18/2019   GLUCOSE 108 (H) 06/18/2019   BUN 20 06/18/2019   CREATININE 0.78 06/18/2019   BILITOT 0.4 06/18/2019   ALKPHOS 33 01/20/2017   AST 21 06/18/2019   ALT 20 06/18/2019   PROT 7.1 06/18/2019   ALBUMIN 5.1 01/20/2017   CALCIUM 9.9 06/18/2019   ANIONGAP 8 08/15/2017   Lab Results  Component Value Date   CHOL 164 06/18/2019   Lab Results  Component Value Date   HDL 40 (L) 06/18/2019   Lab Results  Component Value Date   LDLCALC 101 (H) 06/18/2019   Lab Results  Component Value Date   TRIG 125 06/18/2019   Lab Results  Component Value Date   CHOLHDL 4.1 06/18/2019   Lab Results  Component Value Date   HGBA1C 5.5 06/18/2019      Assessment & Plan:   Problem List Items Addressed This Visit      Cardiovascular and Mediastinum   Hot flashes    New prescription sent for Vivelle-Dot.  Keep appointment in January for Pap smear.        Other   Anxiety - Primary    For anxiety and irritability-GAD-7 score of 7 today.  Will restart her Effexor.  New prescription sent to pharmacy.  Follow-up at appointment in January.      Relevant Medications   venlafaxine XR (EFFEXOR-XR) 75 MG 24 hr capsule    Other Visit Diagnoses    Screening mammogram, encounter for       Relevant Orders   MM 3D SCREEN BREAST BILATERAL      Meds ordered this encounter  Medications  . venlafaxine XR (EFFEXOR-XR) 75 MG 24 hr capsule    Sig: TAKE 1 CAPSULE BY MOUTH EVERY DAY     Dispense:  90 capsule    Refill:  1  . estradiol (VIVELLE-DOT) 0.05 MG/24HR patch    Sig: Place 1 patch (0.05 mg total) onto the skin 2 (two) times a week.    Dispense:  24 patch    Refill:  1   Mammogram is up-to-date.  But did encourage her to schedule for December at our location.  Order placed today.  Follow-up: Return in about 3 months (around 10/20/2019).    I discussed the assessment and treatment plan with the patient. The patient was provided an opportunity to ask questions and all were answered. The patient agreed with the plan and demonstrated an understanding of the instructions.   The patient was advised to call back or seek an in-person evaluation if the symptoms worsen or if the condition fails to improve as anticipated.    Beatrice Lecher, MD

## 2019-07-20 NOTE — Assessment & Plan Note (Signed)
New prescription sent for Vivelle-Dot.  Keep appointment in January for Pap smear.

## 2019-08-03 ENCOUNTER — Other Ambulatory Visit: Payer: Self-pay | Admitting: Family Medicine

## 2019-08-13 ENCOUNTER — Other Ambulatory Visit: Payer: Self-pay | Admitting: Family Medicine

## 2019-08-23 ENCOUNTER — Ambulatory Visit: Payer: BC Managed Care – PPO | Admitting: Obstetrics and Gynecology

## 2019-09-20 ENCOUNTER — Encounter

## 2019-10-01 ENCOUNTER — Ambulatory Visit (INDEPENDENT_AMBULATORY_CARE_PROVIDER_SITE_OTHER): Payer: BC Managed Care – PPO | Admitting: Family Medicine

## 2019-10-01 ENCOUNTER — Encounter: Payer: Self-pay | Admitting: Family Medicine

## 2019-10-01 VITALS — BP 135/68 | HR 91 | Ht 60.0 in | Wt 205.0 lb

## 2019-10-01 DIAGNOSIS — I10 Essential (primary) hypertension: Secondary | ICD-10-CM

## 2019-10-01 DIAGNOSIS — E119 Type 2 diabetes mellitus without complications: Secondary | ICD-10-CM | POA: Diagnosis not present

## 2019-10-01 DIAGNOSIS — R232 Flushing: Secondary | ICD-10-CM | POA: Diagnosis not present

## 2019-10-01 DIAGNOSIS — F419 Anxiety disorder, unspecified: Secondary | ICD-10-CM

## 2019-10-01 LAB — POCT GLYCOSYLATED HEMOGLOBIN (HGB A1C): Hemoglobin A1C: 5.5 % (ref 4.0–5.6)

## 2019-10-01 MED ORDER — PROGESTERONE MICRONIZED 200 MG PO CAPS: 200.0000 mg | ORAL_CAPSULE | ORAL | 6 refills | Status: DC

## 2019-10-01 NOTE — Progress Notes (Signed)
Established Patient Office Visit  Subjective:  Patient ID: Loretta Bullock, female    DOB: 18-Sep-1961  Age: 59 y.o. MRN: [id number]  CC:  Chief Complaint  Patient presents with  . Diabetes  . Hypertension    HPI Loretta Bullock presents for   Hypertension- Pt denies chest pain, SOB, dizziness, or heart palpitations.  Taking meds as directed w/o problems.  Denies medication side effects.    Diabetes - no hypoglycemic events. No wounds or sores that are not healing well. No increased thirst or urination. Checking glucose at home. Taking medications as prescribed without any side effects.  F/U Anxiety and depression -started on Effexor 3 months ago.  Due for follow-up today. Says her son failed some classes.  That has been stressful.    Past Medical History:  Diagnosis Date  . Anxiety   . DDD (degenerative disc disease), cervical   . History of lumbar laminectomy   . Hypertension   . PONV (postoperative nausea and vomiting)   . Sleep apnea    CPAP 7 cm water    Past Surgical History:  Procedure Laterality Date  . BACK SURGERY    . CESAREAN SECTION  2002  . LUMBAR DISC SURGERY  06/2013   Dr. Elliot Dally  . LUMBAR FUSION  08/17/2017  . MAXIMUM ACCESS (MAS)POSTERIOR LUMBAR INTERBODY FUSION (PLIF) 1 LEVEL  2016    Family History  Problem Relation Age of Onset  . Hyperlipidemia Father   . Hyperlipidemia Mother   . Hyperlipidemia Brother   . Diabetes Maternal Grandmother   . Colon cancer Maternal Aunt     Social History   Socioeconomic History  . Marital status: Married    Spouse name: Not on file  . Number of children: Not on file  . Years of education: Not on file  . Highest education level: Not on file  Occupational History  . Not on file  Tobacco Use  . Smoking status: Former Research scientist (life sciences)  . Smokeless tobacco: Never Used  Substance and Sexual Activity  . Alcohol use: Yes    Comment: occ  . Drug use: No  . Sexual activity: Yes    Birth control/protection:  Post-menopausal  Other Topics Concern  . Not on file  Social History Narrative  . Not on file   Social Determinants of Health   Financial Resource Strain:   . Difficulty of Paying Living Expenses: Not on file  Food Insecurity:   . Worried About Charity fundraiser in the Last Year: Not on file  . Ran Out of Food in the Last Year: Not on file  Transportation Needs:   . Lack of Transportation (Medical): Not on file  . Lack of Transportation (Non-Medical): Not on file  Physical Activity:   . Days of Exercise per Week: Not on file  . Minutes of Exercise per Session: Not on file  Stress:   . Feeling of Stress : Not on file  Social Connections:   . Frequency of Communication with Friends and Family: Not on file  . Frequency of Social Gatherings with Friends and Family: Not on file  . Attends Religious Services: Not on file  . Active Member of Clubs or Organizations: Not on file  . Attends Archivist Meetings: Not on file  . Marital Status: Not on file  Intimate Partner Violence:   . Fear of Current or Ex-Partner: Not on file  . Emotionally Abused: Not on file  . Physically Abused: Not on file  .  Sexually Abused: Not on file    Outpatient Medications Prior to Visit  Medication Sig Dispense Refill  . Accu-Chek FastClix Lancets MISC USE 4 TIMES A DAY 102 each prn  . acetaminophen (TYLENOL) 500 MG tablet Take 1,000 mg every 6 (six) hours as needed by mouth for moderate pain or headache.    . AMBULATORY NON FORMULARY MEDICATION Medication Name: CPAP  set to 7 cm of water. Please fit for new facemask with tubing. Humidifier. Diagnosis moderate obstructive sleep apnea. Please see sleep study from 01/27/2005. 1 vial 0  . amLODipine (NORVASC) 5 MG tablet TAKE 1 TABLET BY MOUTH EVERY DAY 90 tablet 1  . blood glucose meter kit and supplies Dispense based on patient and insurance preference. Use up to four times daily as directed. Dx:E11.65. 1 each 0  . Choline Fenofibrate  (FENOFIBRIC ACID) 135 MG CPDR TAKE 1 CAPSULE EVERY DAY 90 capsule 3  . estradiol (VIVELLE-DOT) 0.05 MG/24HR patch Place 1 patch (0.05 mg total) onto the skin 2 (two) times a week. 24 patch 1  . fexofenadine (ALLEGRA) 180 MG tablet Take 180 mg by mouth daily.    . furosemide (LASIX) 20 MG tablet Take 1 tablet (20 mg total) by mouth daily as needed for edema. 30 tablet 1  . glucose blood (ACCU-CHEK GUIDE) test strip To be used for testing up to 4 times daily. Dx:E11.65 400 each 3  . Ketotifen Fumarate (ALAWAY OP) Place 1 drop 2 (two) times daily into both eyes.    . metFORMIN (GLUCOPHAGE) 500 MG tablet Take 1 tablet (500 mg total) by mouth 2 (two) times daily with a meal. 180 tablet 1  . Multiple Vitamin (MULTIVITAMIN) capsule Take 1 capsule by mouth daily.      Marland Kitchen omega-3 acid ethyl esters (LOVAZA) 1 g capsule TAKE 1 CAPSULE (1 G TOTAL) BY MOUTH 2 (TWO) TIMES DAILY. 180 capsule 3  . valsartan-hydrochlorothiazide (DIOVAN-HCT) 320-25 MG tablet TAKE 1 TABLET BY MOUTH EVERY DAY 90 tablet 1  . venlafaxine XR (EFFEXOR-XR) 75 MG 24 hr capsule TAKE 1 CAPSULE BY MOUTH EVERY DAY 90 capsule 1  . progesterone (PROMETRIUM) 200 MG capsule Take 1 capsule (200 mg total) by mouth See admin instructions. Take 200 mg by mouth daily for 2 weeks every other month 14 capsule 6   No facility-administered medications prior to visit.    No Known Allergies  ROS Review of Systems    Objective:    Physical Exam  Constitutional: She is oriented to person, place, and time. She appears well-developed and well-nourished.  HENT:  Head: Normocephalic and atraumatic.  Cardiovascular: Normal rate, regular rhythm and normal heart sounds.  Pulmonary/Chest: Effort normal and breath sounds normal.  Neurological: She is alert and oriented to person, place, and time.  Skin: Skin is warm and dry.  Psychiatric: She has a normal mood and affect. Her behavior is normal.    BP 135/68   Pulse 91   Ht 5' (1.524 m)   Wt 205 lb  (93 kg)   LMP 12/21/2012   SpO2 97%   BMI 40.04 kg/m  Wt Readings from Last 3 Encounters:  10/01/19 205 lb (93 kg)  06/18/19 200 lb (90.7 kg)  03/19/19 195 lb (88.5 kg)     There are no preventive care reminders to display for this patient.  There are no preventive care reminders to display for this patient.  Lab Results  Component Value Date   TSH 4.31 06/18/2019   Lab Results  Component Value Date   WBC 3.9 (L) 08/15/2017   HGB 14.1 08/15/2017   HCT 40.9 08/15/2017   MCV 85.4 08/15/2017   PLT 236 08/15/2017   Lab Results  Component Value Date   NA 140 06/18/2019   K 3.9 06/18/2019   CO2 28 06/18/2019   GLUCOSE 108 (H) 06/18/2019   BUN 20 06/18/2019   CREATININE 0.78 06/18/2019   BILITOT 0.4 06/18/2019   ALKPHOS 33 01/20/2017   AST 21 06/18/2019   ALT 20 06/18/2019   PROT 7.1 06/18/2019   ALBUMIN 5.1 01/20/2017   CALCIUM 9.9 06/18/2019   ANIONGAP 8 08/15/2017   Lab Results  Component Value Date   CHOL 164 06/18/2019   Lab Results  Component Value Date   HDL 40 (L) 06/18/2019   Lab Results  Component Value Date   LDLCALC 101 (H) 06/18/2019   Lab Results  Component Value Date   TRIG 125 06/18/2019   Lab Results  Component Value Date   CHOLHDL 4.1 06/18/2019   Lab Results  Component Value Date   HGBA1C 5.5 06/18/2019      Assessment & Plan:   Problem List Items Addressed This Visit      Cardiovascular and Mediastinum   HYPERTENSION, BENIGN - Primary    Well controlled. Continue current regimen. Follow up in  4 months.        Hot flashes    Continue HRT.  Just reminded her again to schedule her mammogram.  Check she has a day off at the end of the month take her mother to her doctor's appointment so we discussed scheduling her appointment here for Pap smear later that day.  Discussed the importance of making sure that we monitor these things while she is on hormone replacement therapy.        Endocrine   Controlled type 2 diabetes  mellitus without complication, without long-term current use of insulin ([place])    Well controlled. Continue current regimen. Follow up in  4 months.        Relevant Orders   POCT HgB A1C     Other   Anxiety    Doing well on Effexor.  She is happy with her regimen.            Meds ordered this encounter  Medications  . progesterone (PROMETRIUM) 200 MG capsule    Sig: Take 1 capsule (200 mg total) by mouth See admin instructions. Take 200 mg by mouth daily for 2 weeks every other month    Dispense:  14 capsule    Refill:  6    Follow-up: Return in about 4 months (around 01/29/2020) for Hypertension, Diabetes follow-up.    Beatrice Lecher, MD

## 2019-10-01 NOTE — Assessment & Plan Note (Signed)
Continue HRT.  Just reminded her again to schedule her mammogram.  Check she has a day off at the end of the month take her mother to her doctor's appointment so we discussed scheduling her appointment here for Pap smear later that day.  Discussed the importance of making sure that we monitor these things while she is on hormone replacement therapy.

## 2019-10-01 NOTE — Assessment & Plan Note (Addendum)
Doing well on Effexor.  She is happy with her regimen.

## 2019-10-01 NOTE — Assessment & Plan Note (Signed)
Well controlled. Continue current regimen. Follow up in  4 months.   

## 2019-10-15 ENCOUNTER — Encounter: Payer: BC Managed Care – PPO | Admitting: Family Medicine

## 2019-10-29 ENCOUNTER — Ambulatory Visit (INDEPENDENT_AMBULATORY_CARE_PROVIDER_SITE_OTHER): Payer: BC Managed Care – PPO | Admitting: Family Medicine

## 2019-10-29 ENCOUNTER — Encounter: Payer: Self-pay | Admitting: Family Medicine

## 2019-10-29 ENCOUNTER — Other Ambulatory Visit (HOSPITAL_COMMUNITY)
Admission: RE | Admit: 2019-10-29 | Discharge: 2019-10-29 | Disposition: A | Discharge status: Home / Self Care | Payer: BC Managed Care – PPO | Source: Ambulatory Visit | Attending: Family Medicine | Admitting: Family Medicine

## 2019-10-29 VITALS — BP 140/75 | HR 88 | Ht 60.0 in | Wt 205.0 lb

## 2019-10-29 DIAGNOSIS — Z124 Encounter for screening for malignant neoplasm of cervix: Secondary | ICD-10-CM | POA: Insufficient documentation

## 2019-10-29 DIAGNOSIS — Z01419 Encounter for gynecological examination (general) (routine) without abnormal findings: Secondary | ICD-10-CM

## 2019-10-29 DIAGNOSIS — Z23 Encounter for immunization: Secondary | ICD-10-CM | POA: Diagnosis not present

## 2019-10-29 NOTE — Progress Notes (Signed)
Subjective:     Loretta Bullock is a 59 y.o. female and is here for a comprehensive physical exam. The patient reports no problems.  He is not currently exercising and is due for her Pap smear.  Health Maintenance  Topic Date Due  . PAP SMEAR-Modifier  04/19/2020 (Originally 04/08/2019)  . FOOT EXAM  12/11/2019  . OPHTHALMOLOGY EXAM  12/28/2019  . HEMOGLOBIN A1C  03/30/2020  . MAMMOGRAM  09/08/2020  . COLONOSCOPY  11/26/2021  . TETANUS/TDAP  05/07/2024  . INFLUENZA VACCINE  Completed  . PNEUMOCOCCAL POLYSACCHARIDE VACCINE AGE 52-64 HIGH RISK  Completed  . Hepatitis C Screening  Completed  . HIV Screening  Completed     Social History   Socioeconomic History  . Marital status: Married    Spouse name: Not on file  . Number of children: Not on file  . Years of education: Not on file  . Highest education level: Not on file  Occupational History  . Not on file  Tobacco Use  . Smoking status: Former Research scientist (life sciences)  . Smokeless tobacco: Never Used  Substance and Sexual Activity  . Alcohol use: Yes    Comment: occ  . Drug use: No  . Sexual activity: Yes    Birth control/protection: Post-menopausal  Other Topics Concern  . Not on file  Social History Narrative  . Not on file   Social Determinants of Health   Financial Resource Strain:   . Difficulty of Paying Living Expenses: Not on file  Food Insecurity:   . Worried About Charity fundraiser in the Last Year: Not on file  . Ran Out of Food in the Last Year: Not on file  Transportation Needs:   . Lack of Transportation (Medical): Not on file  . Lack of Transportation (Non-Medical): Not on file  Physical Activity:   . Days of Exercise per Week: Not on file  . Minutes of Exercise per Session: Not on file  Stress:   . Feeling of Stress : Not on file  Social Connections:   . Frequency of Communication with Friends and Family: Not on file  . Frequency of Social Gatherings with Friends and Family: Not on file  . Attends Religious  Services: Not on file  . Active Member of Clubs or Organizations: Not on file  . Attends Archivist Meetings: Not on file  . Marital Status: Not on file  Intimate Partner Violence:   . Fear of Current or Ex-Partner: Not on file  . Emotionally Abused: Not on file  . Physically Abused: Not on file  . Sexually Abused: Not on file   Health Maintenance  Topic Date Due  . PAP SMEAR-Modifier  04/19/2020 (Originally 04/08/2019)  . FOOT EXAM  12/11/2019  . OPHTHALMOLOGY EXAM  12/28/2019  . HEMOGLOBIN A1C  03/30/2020  . MAMMOGRAM  09/08/2020  . COLONOSCOPY  11/26/2021  . TETANUS/TDAP  05/07/2024  . INFLUENZA VACCINE  Completed  . PNEUMOCOCCAL POLYSACCHARIDE VACCINE AGE 52-64 HIGH RISK  Completed  . Hepatitis C Screening  Completed  . HIV Screening  Completed    The following portions of the patient's history were reviewed and updated as appropriate: allergies, current medications, past family history, past medical history, past social history, past surgical history and problem list.  Review of Systems A comprehensive review of systems was negative.   Objective:    BP 140/75   Pulse 88   Ht 5' (1.524 m)   Wt 205 lb (93 kg)  LMP 12/21/2012   SpO2 99%   BMI 40.04 kg/m  General appearance: alert, cooperative and appears stated age Head: Normocephalic, without obvious abnormality, atraumatic Eyes: conj clear, EOMi, PEERLA Ears: normal TM's and external ear canals both ears Nose: Nares normal. Septum midline. Mucosa normal. No drainage or sinus tenderness. Throat: lips, mucosa, and tongue normal; teeth and gums normal Neck: no adenopathy, no carotid bruit, no JVD, supple, symmetrical, trachea midline and thyroid not enlarged, symmetric, no tenderness/mass/nodules Back: symmetric, no curvature. ROM normal. No CVA tenderness. Lungs: clear to auscultation bilaterally Breasts: normal appearance, no masses or tenderness Heart: regular rate and rhythm, S1, S2 normal, no murmur,  click, rub or gallop Abdomen: soft, non-tender; bowel sounds normal; no masses,  no organomegaly Pelvic: cervix normal in appearance, external genitalia normal, no adnexal masses or tenderness, no cervical motion tenderness, rectovaginal septum normal, uterus normal size, shape, and consistency and vagina normal without discharge Extremities: extremities normal, atraumatic, no cyanosis or edema Pulses: 2+ and symmetric Skin: Skin color, texture, turgor normal. No rashes or lesions Lymph nodes: Cervical, supraclavicular, and axillary nodes normal. Neurologic: Alert and oriented X 3, normal strength and tone. Normal symmetric reflexes. Normal coordination and gait    Assessment:    Healthy female exam.      Plan:     See After Visit Summary for Counseling Recommendations   Keep up a regular exercise program and make sure you are eating a healthy diet Try to eat 4 servings of dairy a day, or if you are lactose intolerant take a calcium with vitamin D daily.  Your vaccines are up to date.  First shingles vaccine given today. Plans to schedule her mammogram.  F/U in 3 months for diabetes.

## 2019-10-29 NOTE — Patient Instructions (Signed)
Health Maintenance, Female Adopting a healthy lifestyle and getting preventive care are important in promoting health and wellness. Ask your health care provider about:  The right schedule for you to have regular tests and exams.  Things you can do on your own to prevent diseases and keep yourself healthy. What should I know about diet, weight, and exercise? Eat a healthy diet   Eat a diet that includes plenty of vegetables, fruits, low-fat dairy products, and lean protein.  Do not eat a lot of foods that are high in solid fats, added sugars, or sodium. Maintain a healthy weight Body mass index (BMI) is used to identify weight problems. It estimates body fat based on height and weight. Your health care provider can help determine your BMI and help you achieve or maintain a healthy weight. Get regular exercise Get regular exercise. This is one of the most important things you can do for your health. Most adults should:  Exercise for at least 150 minutes each week. The exercise should increase your heart rate and make you sweat (moderate-intensity exercise).  Do strengthening exercises at least twice a week. This is in addition to the moderate-intensity exercise.  Spend less time sitting. Even light physical activity can be beneficial. Watch cholesterol and blood lipids Have your blood tested for lipids and cholesterol at 59 years of age, then have this test every 5 years. Have your cholesterol levels checked more often if:  Your lipid or cholesterol levels are high.  You are older than 59 years of age.  You are at high risk for heart disease. What should I know about cancer screening? Depending on your health history and family history, you may need to have cancer screening at various ages. This may include screening for:  Breast cancer.  Cervical cancer.  Colorectal cancer.  Skin cancer.  Lung cancer. What should I know about heart disease, diabetes, and high blood  pressure? Blood pressure and heart disease  High blood pressure causes heart disease and increases the risk of stroke. This is more likely to develop in people who have high blood pressure readings, are of African descent, or are overweight.  Have your blood pressure checked: ? Every 3-5 years if you are 18-39 years of age. ? Every year if you are 40 years old or older. Diabetes Have regular diabetes screenings. This checks your fasting blood sugar level. Have the screening done:  Once every three years after age 40 if you are at a normal weight and have a low risk for diabetes.  More often and at a younger age if you are overweight or have a high risk for diabetes. What should I know about preventing infection? Hepatitis B If you have a higher risk for hepatitis B, you should be screened for this virus. Talk with your health care provider to find out if you are at risk for hepatitis B infection. Hepatitis C Testing is recommended for:  Everyone born from 1945 through 1965.  Anyone with known risk factors for hepatitis C. Sexually transmitted infections (STIs)  Get screened for STIs, including gonorrhea and chlamydia, if: ? You are sexually active and are younger than 59 years of age. ? You are older than 59 years of age and your health care provider tells you that you are at risk for this type of infection. ? Your sexual activity has changed since you were last screened, and you are at increased risk for chlamydia or gonorrhea. Ask your health care provider if   you are at risk.  Ask your health care provider about whether you are at high risk for HIV. Your health care provider may recommend a prescription medicine to help prevent HIV infection. If you choose to take medicine to prevent HIV, you should first get tested for HIV. You should then be tested every 3 months for as long as you are taking the medicine. Pregnancy  If you are about to stop having your period (premenopausal) and  you may become pregnant, seek counseling before you get pregnant.  Take 400 to 800 micrograms (mcg) of folic acid every day if you become pregnant.  Ask for birth control (contraception) if you want to prevent pregnancy. Osteoporosis and menopause Osteoporosis is a disease in which the bones lose minerals and strength with aging. This can result in bone fractures. If you are 65 years old or older, or if you are at risk for osteoporosis and fractures, ask your health care provider if you should:  Be screened for bone loss.  Take a calcium or vitamin D supplement to lower your risk of fractures.  Be given hormone replacement therapy (HRT) to treat symptoms of menopause. Follow these instructions at home: Lifestyle  Do not use any products that contain nicotine or tobacco, such as cigarettes, e-cigarettes, and chewing tobacco. If you need help quitting, ask your health care provider.  Do not use street drugs.  Do not share needles.  Ask your health care provider for help if you need support or information about quitting drugs. Alcohol use  Do not drink alcohol if: ? Your health care provider tells you not to drink. ? You are pregnant, may be pregnant, or are planning to become pregnant.  If you drink alcohol: ? Limit how much you use to 0-1 drink a day. ? Limit intake if you are breastfeeding.  Be aware of how much alcohol is in your drink. In the [place] one drink equals one 12 oz bottle of beer (355 mL), one 5 oz glass of wine (148 mL), or one 1 oz glass of hard liquor (44 mL). General instructions  Schedule regular health, dental, and eye exams.  Stay current with your vaccines.  Tell your health care provider if: ? You often feel depressed. ? You have ever been abused or do not feel safe at home. Summary  Adopting a healthy lifestyle and getting preventive care are important in promoting health and wellness.  Follow your health care provider's instructions about healthy  diet, exercising, and getting tested or screened for diseases.  Follow your health care provider's instructions on monitoring your cholesterol and blood pressure. This information is not intended to replace advice given to you by your health care provider. Make sure you discuss any questions you have with your health care provider. Document Revised: 08/30/2018 Document Reviewed: 08/30/2018 Elsevier Patient Education  2020 [employer] Inc[employer]  

## 2019-10-30 LAB — CYTOLOGY - PAP
Adequacy: ABSENT
Comment: NEGATIVE
Diagnosis: NEGATIVE
High risk HPV: NEGATIVE

## 2019-10-30 NOTE — Progress Notes (Signed)
Your Pap smear is normal. Repeat in 5 years.

## 2019-11-28 ENCOUNTER — Other Ambulatory Visit: Payer: Self-pay

## 2019-11-28 ENCOUNTER — Ambulatory Visit (INDEPENDENT_AMBULATORY_CARE_PROVIDER_SITE_OTHER): Payer: BC Managed Care – PPO

## 2019-11-28 DIAGNOSIS — Z1231 Encounter for screening mammogram for malignant neoplasm of breast: Secondary | ICD-10-CM

## 2019-12-16 ENCOUNTER — Other Ambulatory Visit: Payer: Self-pay | Admitting: Family Medicine

## 2019-12-25 ENCOUNTER — Other Ambulatory Visit: Payer: Self-pay | Admitting: Family Medicine

## 2019-12-25 DIAGNOSIS — I10 Essential (primary) hypertension: Secondary | ICD-10-CM

## 2020-01-02 ENCOUNTER — Other Ambulatory Visit: Payer: Self-pay | Admitting: Family Medicine

## 2020-01-28 ENCOUNTER — Ambulatory Visit (INDEPENDENT_AMBULATORY_CARE_PROVIDER_SITE_OTHER): Payer: BC Managed Care – PPO | Admitting: Family Medicine

## 2020-01-28 ENCOUNTER — Encounter: Payer: Self-pay | Admitting: Family Medicine

## 2020-01-28 VITALS — BP 142/78 | HR 94 | Ht 60.0 in | Wt 209.0 lb

## 2020-01-28 DIAGNOSIS — R232 Flushing: Secondary | ICD-10-CM | POA: Diagnosis not present

## 2020-01-28 DIAGNOSIS — F39 Unspecified mood [affective] disorder: Secondary | ICD-10-CM | POA: Diagnosis not present

## 2020-01-28 DIAGNOSIS — I10 Essential (primary) hypertension: Secondary | ICD-10-CM | POA: Diagnosis not present

## 2020-01-28 DIAGNOSIS — E119 Type 2 diabetes mellitus without complications: Secondary | ICD-10-CM | POA: Diagnosis not present

## 2020-01-28 LAB — POCT GLYCOSYLATED HEMOGLOBIN (HGB A1C): Hemoglobin A1C: 5.6 % (ref 4.0–5.6)

## 2020-01-28 LAB — BASIC METABOLIC PANEL WITH GFR
BUN/Creatinine Ratio: 13 (calc) (ref 6–22)
BUN: 15 mg/dL (ref 7–25)
CO2: 31 mmol/L (ref 20–32)
Calcium: 12 mg/dL — ABNORMAL HIGH (ref 8.6–10.4)
Chloride: 101 mmol/L (ref 98–110)
Creat: 1.12 mg/dL — ABNORMAL HIGH (ref 0.50–1.05)
GFR, Est African American: 63 mL/min/{1.73_m2} (ref >=60)
GFR, Est Non African American: 54 mL/min/{1.73_m2} — ABNORMAL LOW (ref >=60)
Glucose, Bld: 152 mg/dL — ABNORMAL HIGH (ref 65–99)
Potassium: 4.2 mmol/L (ref 3.5–5.3)
Sodium: 141 mmol/L (ref 135–146)

## 2020-01-28 MED ORDER — VENLAFAXINE HCL ER 75 MG PO CP24: ORAL_CAPSULE | ORAL | 1 refills | Status: DC

## 2020-01-28 MED ORDER — VALSARTAN-HYDROCHLOROTHIAZIDE 320-25 MG PO TABS: 1.0000 | ORAL_TABLET | Freq: Every day | ORAL | 1 refills | Status: DC

## 2020-01-28 NOTE — Assessment & Plan Note (Signed)
Doing well on the Effexor.

## 2020-01-28 NOTE — Assessment & Plan Note (Signed)
A1C ok today at 5.6. Discussed importance of weight loss and diet and exercise. Has been eating out a lot lately.

## 2020-01-28 NOTE — Assessment & Plan Note (Signed)
Well controlled on HRT and effexor

## 2020-01-28 NOTE — Progress Notes (Signed)
Established Patient Office Visit  Subjective:  Patient ID: Loretta Bullock, female    DOB: 08-03-1961  Age: 59 y.o. MRN: [id number]  CC:  Chief Complaint  Patient presents with  . Diabetes  . Hypertension    HPI Loretta Bullock presents for   Hypertension- Pt denies chest pain, SOB, dizziness, or heart palpitations.  Taking meds as directed w/o problems.  Denies medication side effects.    Diabetes - no hypoglycemic events. No wounds or sores that are not healing well. No increased thirst or urination. Checking glucose at home. Taking medications as prescribed without any side effects.   Past Medical History:  Diagnosis Date  . Anxiety   . DDD (degenerative disc disease), cervical   . History of lumbar laminectomy   . Hypertension   . PONV (postoperative nausea and vomiting)   . Sleep apnea    CPAP 7 cm water    Past Surgical History:  Procedure Laterality Date  . BACK SURGERY    . CESAREAN SECTION  2002  . LUMBAR DISC SURGERY  06/2013   Dr. Elliot Dally  . LUMBAR FUSION  08/17/2017  . MAXIMUM ACCESS (MAS)POSTERIOR LUMBAR INTERBODY FUSION (PLIF) 1 LEVEL  2016    Family History  Problem Relation Age of Onset  . Hyperlipidemia Father   . Hyperlipidemia Mother   . Hyperlipidemia Brother   . Diabetes Maternal Grandmother   . Colon cancer Maternal Aunt     Social History   Socioeconomic History  . Marital status: Married    Spouse name: Not on file  . Number of children: Not on file  . Years of education: Not on file  . Highest education level: Not on file  Occupational History  . Not on file  Tobacco Use  . Smoking status: Former Research scientist (life sciences)  . Smokeless tobacco: Never Used  Substance and Sexual Activity  . Alcohol use: Yes    Comment: occ  . Drug use: No  . Sexual activity: Yes    Birth control/protection: Post-menopausal  Other Topics Concern  . Not on file  Social History Narrative  . Not on file   Social Determinants of Health   Financial  Resource Strain:   . Difficulty of Paying Living Expenses:   Food Insecurity:   . Worried About Charity fundraiser in the Last Year:   . Arboriculturist in the Last Year:   Transportation Needs:   . Film/video editor (Medical):   Marland Kitchen Lack of Transportation (Non-Medical):   Physical Activity:   . Days of Exercise per Week:   . Minutes of Exercise per Session:   Stress:   . Feeling of Stress :   Social Connections:   . Frequency of Communication with Friends and Family:   . Frequency of Social Gatherings with Friends and Family:   . Attends Religious Services:   . Active Member of Clubs or Organizations:   . Attends Archivist Meetings:   Marland Kitchen Marital Status:   Intimate Partner Violence:   . Fear of Current or Ex-Partner:   . Emotionally Abused:   Marland Kitchen Physically Abused:   . Sexually Abused:     Outpatient Medications Prior to Visit  Medication Sig Dispense Refill  . Accu-Chek FastClix Lancets MISC USE 4 TIMES A DAY 102 each prn  . acetaminophen (TYLENOL) 500 MG tablet Take 1,000 mg every 6 (six) hours as needed by mouth for moderate pain or headache.    . AMBULATORY NON FORMULARY  MEDICATION Medication Name: CPAP  set to 7 cm of water. Please fit for new facemask with tubing. Humidifier. Diagnosis moderate obstructive sleep apnea. Please see sleep study from 01/27/2005. 1 vial 0  . amLODipine (NORVASC) 5 MG tablet TAKE 1 TABLET BY MOUTH EVERY DAY 90 tablet 0  . blood glucose meter kit and supplies Dispense based on patient and insurance preference. Use up to four times daily as directed. Dx:E11.65. 1 each 0  . Choline Fenofibrate (FENOFIBRIC ACID) 135 MG CPDR TAKE 1 CAPSULE EVERY DAY 90 capsule 3  . estradiol (VIVELLE-DOT) 0.05 MG/24HR patch PLACE 1 PATCH (0.05 MG TOTAL) ONTO THE SKIN 2 (TWO) TIMES A WEEK. 24 patch 1  . fexofenadine (ALLEGRA) 180 MG tablet Take 180 mg by mouth daily.    . furosemide (LASIX) 20 MG tablet Take 1 tablet (20 mg total) by mouth daily as needed  for edema. 30 tablet 1  . glucose blood (ACCU-CHEK GUIDE) test strip To be used for testing up to 4 times daily. Dx:E11.65 400 each 3  . Ketotifen Fumarate (ALAWAY OP) Place 1 drop 2 (two) times daily into both eyes.    . metFORMIN (GLUCOPHAGE) 500 MG tablet TAKE 1 TABLET TWICE A DAY WITH A MEAL 180 tablet 1  . Multiple Vitamin (MULTIVITAMIN) capsule Take 1 capsule by mouth daily.      Marland Kitchen omega-3 acid ethyl esters (LOVAZA) 1 g capsule TAKE 1 CAPSULE (1 G TOTAL) BY MOUTH 2 (TWO) TIMES DAILY. 180 capsule 3  . progesterone (PROMETRIUM) 200 MG capsule Take 1 capsule (200 mg total) by mouth See admin instructions. Take 200 mg by mouth daily for 2 weeks every other month 14 capsule 6  . valsartan-hydrochlorothiazide (DIOVAN-HCT) 320-25 MG tablet TAKE 1 TABLET BY MOUTH EVERY DAY 90 tablet 1  . venlafaxine XR (EFFEXOR-XR) 75 MG 24 hr capsule TAKE 1 CAPSULE BY MOUTH EVERY DAY 90 capsule 1   No facility-administered medications prior to visit.    No Known Allergies  ROS Review of Systems    Objective:    Physical Exam  BP (!) 142/78   Pulse 94   Ht 5' (1.524 m)   Wt 209 lb (94.8 kg)   LMP 12/21/2012   SpO2 97%   BMI 40.82 kg/m  Wt Readings from Last 3 Encounters:  01/28/20 209 lb (94.8 kg)  10/29/19 205 lb (93 kg)  10/01/19 205 lb (93 kg)     There are no preventive care reminders to display for this patient.  There are no preventive care reminders to display for this patient.  Lab Results  Component Value Date   TSH 4.31 06/18/2019   Lab Results  Component Value Date   WBC 3.9 (L) 08/15/2017   HGB 14.1 08/15/2017   HCT 40.9 08/15/2017   MCV 85.4 08/15/2017   PLT 236 08/15/2017   Lab Results  Component Value Date   NA 140 06/18/2019   K 3.9 06/18/2019   CO2 28 06/18/2019   GLUCOSE 108 (H) 06/18/2019   BUN 20 06/18/2019   CREATININE 0.78 06/18/2019   BILITOT 0.4 06/18/2019   ALKPHOS 33 01/20/2017   AST 21 06/18/2019   ALT 20 06/18/2019   PROT 7.1 06/18/2019    ALBUMIN 5.1 01/20/2017   CALCIUM 9.9 06/18/2019   ANIONGAP 8 08/15/2017   Lab Results  Component Value Date   CHOL 164 06/18/2019   Lab Results  Component Value Date   HDL 40 (L) 06/18/2019   Lab Results  Component  Value Date   LDLCALC 101 (H) 06/18/2019   Lab Results  Component Value Date   TRIG 125 06/18/2019   Lab Results  Component Value Date   CHOLHDL 4.1 06/18/2019   Lab Results  Component Value Date   HGBA1C 5.6 01/28/2020      Assessment & Plan:   Problem List Items Addressed This Visit      Cardiovascular and Mediastinum   HYPERTENSION, BENIGN - Primary    BP borderline today. Continue current regimen. Follow up in  6 mo      Relevant Medications   valsartan-hydrochlorothiazide (DIOVAN-HCT) 320-25 MG tablet   Other Relevant Orders   BASIC METABOLIC PANEL WITH GFR   Hot flashes    Well controlled on HRT and effexor      Relevant Medications   valsartan-hydrochlorothiazide (DIOVAN-HCT) 320-25 MG tablet     Endocrine   Controlled type 2 diabetes mellitus without complication, without long-term current use of insulin (HCC)    A1C ok today at 5.6. Discussed importance of weight loss and diet and exercise. Has been eating out a lot lately.        Relevant Medications   valsartan-hydrochlorothiazide (DIOVAN-HCT) 320-25 MG tablet   Other Relevant Orders   POCT glycosylated hemoglobin (Hb A1C) (Completed)   BASIC METABOLIC PANEL WITH GFR     Other   Episodic mood disorder (HCC)    Doing well on the Effexor.           Meds ordered this encounter  Medications  . valsartan-hydrochlorothiazide (DIOVAN-HCT) 320-25 MG tablet    Sig: Take 1 tablet by mouth daily.    Dispense:  90 tablet    Refill:  1  . venlafaxine XR (EFFEXOR-XR) 75 MG 24 hr capsule    Sig: TAKE 1 CAPSULE BY MOUTH EVERY DAY    Dispense:  90 capsule    Refill:  1    Follow-up: Return in about 4 months (around 05/30/2020) for Diabetes follow-up.    Beatrice Lecher, MD

## 2020-01-28 NOTE — Assessment & Plan Note (Addendum)
BP borderline today. Continue current regimen. Follow up in  6 mo

## 2020-01-31 ENCOUNTER — Other Ambulatory Visit: Payer: Self-pay | Admitting: *Deleted

## 2020-01-31 DIAGNOSIS — R7989 Other specified abnormal findings of blood chemistry: Secondary | ICD-10-CM

## 2020-03-23 ENCOUNTER — Other Ambulatory Visit: Payer: Self-pay | Admitting: Osteopathic Medicine

## 2020-03-23 DIAGNOSIS — I10 Essential (primary) hypertension: Secondary | ICD-10-CM

## 2020-04-04 ENCOUNTER — Other Ambulatory Visit: Payer: Self-pay | Admitting: Family Medicine

## 2020-05-24 ENCOUNTER — Other Ambulatory Visit: Payer: Self-pay | Admitting: Family Medicine

## 2020-06-02 ENCOUNTER — Ambulatory Visit (INDEPENDENT_AMBULATORY_CARE_PROVIDER_SITE_OTHER): Payer: BC Managed Care – PPO | Admitting: Family Medicine

## 2020-06-02 ENCOUNTER — Encounter: Payer: Self-pay | Admitting: Family Medicine

## 2020-06-02 VITALS — BP 139/79 | HR 87 | Ht 60.0 in | Wt 204.0 lb

## 2020-06-02 DIAGNOSIS — E119 Type 2 diabetes mellitus without complications: Secondary | ICD-10-CM | POA: Diagnosis not present

## 2020-06-02 DIAGNOSIS — E785 Hyperlipidemia, unspecified: Secondary | ICD-10-CM | POA: Diagnosis not present

## 2020-06-02 DIAGNOSIS — R232 Flushing: Secondary | ICD-10-CM

## 2020-06-02 DIAGNOSIS — I10 Essential (primary) hypertension: Secondary | ICD-10-CM

## 2020-06-02 DIAGNOSIS — Z23 Encounter for immunization: Secondary | ICD-10-CM

## 2020-06-02 LAB — POCT GLYCOSYLATED HEMOGLOBIN (HGB A1C): Hemoglobin A1C: 5.5 % (ref 4.0–5.6)

## 2020-06-02 MED ORDER — METFORMIN HCL 500 MG PO TABS: 500.0000 mg | ORAL_TABLET | Freq: Every day | ORAL | 1 refills | Status: DC

## 2020-06-02 MED ORDER — AMLODIPINE BESYLATE 5 MG PO TABS: 5.0000 mg | ORAL_TABLET | Freq: Every day | ORAL | 1 refills | Status: DC

## 2020-06-02 NOTE — Progress Notes (Signed)
Established Patient Office Visit  Subjective:  Patient ID: Loretta Bullock, female    DOB: [birth year]  Age: 59 y.o. MRN: [id number]  CC:  Chief Complaint  Patient presents with  . Diabetes    HPI Loretta Bullock presents for   Hypertension- Pt denies chest pain, SOB, dizziness, or heart palpitations.  Taking meds as directed w/o problems.  Denies medication side effects.    Diabetes - no hypoglycemic events. No wounds or sores that are not healing well. No increased thirst or urination. Checking glucose at home. Taking medications as prescribed without any side effects.   Past Medical History:  Diagnosis Date  . Anxiety   . DDD (degenerative disc disease), cervical   . History of lumbar laminectomy   . Hypertension   . PONV (postoperative nausea and vomiting)   . Sleep apnea    CPAP 7 cm water    Past Surgical History:  Procedure Laterality Date  . BACK SURGERY    . CESAREAN SECTION  2002  . LUMBAR DISC SURGERY  06/2013   Dr. Elliot Dally  . LUMBAR FUSION  08/17/2017  . MAXIMUM ACCESS (MAS)POSTERIOR LUMBAR INTERBODY FUSION (PLIF) 1 LEVEL  2016    Family History  Problem Relation Age of Onset  . Hyperlipidemia Father   . Hyperlipidemia Mother   . Hyperlipidemia Brother   . Diabetes Maternal Grandmother   . Colon cancer Maternal Aunt     Social History   Socioeconomic History  . Marital status: Married    Spouse name: Not on file  . Number of children: Not on file  . Years of education: Not on file  . Highest education level: Not on file  Occupational History  . Not on file  Tobacco Use  . Smoking status: Former Research scientist (life sciences)  . Smokeless tobacco: Never Used  Vaping Use  . Vaping Use: Never used  Substance and Sexual Activity  . Alcohol use: Yes    Comment: occ  . Drug use: No  . Sexual activity: Yes    Birth control/protection: Post-menopausal  Other Topics Concern  . Not on file  Social History Narrative  . Not on file   Social Determinants of  Health   Financial Resource Strain:   . Difficulty of Paying Living Expenses: Not on file  Food Insecurity:   . Worried About Charity fundraiser in the Last Year: Not on file  . Ran Out of Food in the Last Year: Not on file  Transportation Needs:   . Lack of Transportation (Medical): Not on file  . Lack of Transportation (Non-Medical): Not on file  Physical Activity:   . Days of Exercise per Week: Not on file  . Minutes of Exercise per Session: Not on file  Stress:   . Feeling of Stress : Not on file  Social Connections:   . Frequency of Communication with Friends and Family: Not on file  . Frequency of Social Gatherings with Friends and Family: Not on file  . Attends Religious Services: Not on file  . Active Member of Clubs or Organizations: Not on file  . Attends Archivist Meetings: Not on file  . Marital Status: Not on file  Intimate Partner Violence:   . Fear of Current or Ex-Partner: Not on file  . Emotionally Abused: Not on file  . Physically Abused: Not on file  . Sexually Abused: Not on file    Outpatient Medications Prior to Visit  Medication Sig Dispense Refill  .  Accu-Chek FastClix Lancets MISC USE 4 TIMES A DAY 102 each prn  . acetaminophen (TYLENOL) 500 MG tablet Take 1,000 mg every 6 (six) hours as needed by mouth for moderate pain or headache.    . AMBULATORY NON FORMULARY MEDICATION Medication Name: CPAP  set to 7 cm of water. Please fit for new facemask with tubing. Humidifier. Diagnosis moderate obstructive sleep apnea. Please see sleep study from 01/27/2005. 1 vial 0  . blood glucose meter kit and supplies Dispense based on patient and insurance preference. Use up to four times daily as directed. Dx:E11.65. 1 each 0  . Choline Fenofibrate (FENOFIBRIC ACID) 135 MG CPDR TAKE 1 CAPSULE EVERY DAY 90 capsule 3  . estradiol (VIVELLE-DOT) 0.05 MG/24HR patch PLACE 1 PATCH (0.05 MG TOTAL) ONTO THE SKIN 2 (TWO) TIMES A WEEK. 24 patch 1  . fexofenadine  (ALLEGRA) 180 MG tablet Take 180 mg by mouth daily.    Marland Kitchen glucose blood (ACCU-CHEK GUIDE) test strip To be used for testing up to 4 times daily. Dx:E11.65 400 each 3  . Ketotifen Fumarate (ALAWAY OP) Place 1 drop 2 (two) times daily into both eyes.    . Multiple Vitamin (MULTIVITAMIN) capsule Take 1 capsule by mouth daily.      Marland Kitchen omega-3 acid ethyl esters (LOVAZA) 1 g capsule TAKE 1 CAPSULE (1 G TOTAL) BY MOUTH 2 (TWO) TIMES DAILY. 180 capsule 3  . progesterone (PROMETRIUM) 200 MG capsule TAKE 1 CAPSULE BY MOUTH DAILY FOR 2 WEEKS EVERY OTHER MONTH 42 capsule 2  . valsartan-hydrochlorothiazide (DIOVAN-HCT) 320-25 MG tablet Take 1 tablet by mouth daily. 90 tablet 1  . venlafaxine XR (EFFEXOR-XR) 75 MG 24 hr capsule TAKE 1 CAPSULE BY MOUTH EVERY DAY 90 capsule 1  . amLODipine (NORVASC) 5 MG tablet TAKE 1 TABLET BY MOUTH EVERY DAY 90 tablet 0  . furosemide (LASIX) 20 MG tablet Take 1 tablet (20 mg total) by mouth daily as needed for edema. 30 tablet 1  . metFORMIN (GLUCOPHAGE) 500 MG tablet TAKE 1 TABLET TWICE A DAY WITH A MEAL 180 tablet 1   No facility-administered medications prior to visit.    No Known Allergies  ROS Review of Systems    Objective:    Physical Exam Constitutional:      Appearance: She is well-developed.  HENT:     Head: Normocephalic and atraumatic.  Cardiovascular:     Rate and Rhythm: Normal rate and regular rhythm.     Heart sounds: Normal heart sounds.  Pulmonary:     Effort: Pulmonary effort is normal.     Breath sounds: Normal breath sounds.  Skin:    General: Skin is warm and dry.  Neurological:     Mental Status: She is alert and oriented to person, place, and time.  Psychiatric:        Behavior: Behavior normal.     BP 139/79   Pulse 87   Ht 5' (1.524 m)   Wt 204 lb (92.5 kg)   LMP 12/21/2012   SpO2 96%   BMI 39.84 kg/m  Wt Readings from Last 3 Encounters:  06/02/20 204 lb (92.5 kg)  01/28/20 209 lb (94.8 kg)  10/29/19 205 lb (93 kg)      There are no preventive care reminders to display for this patient.  There are no preventive care reminders to display for this patient.  Lab Results  Component Value Date   TSH 4.31 06/18/2019   Lab Results  Component Value Date  WBC 3.9 (L) 08/15/2017   HGB 14.1 08/15/2017   HCT 40.9 08/15/2017   MCV 85.4 08/15/2017   PLT 236 08/15/2017   Lab Results  Component Value Date   NA 141 01/28/2020   K 4.2 01/28/2020   CO2 31 01/28/2020   GLUCOSE 152 (H) 01/28/2020   BUN 15 01/28/2020   CREATININE 1.12 (H) 01/28/2020   BILITOT 0.4 06/18/2019   ALKPHOS 33 01/20/2017   AST 21 06/18/2019   ALT 20 06/18/2019   PROT 7.1 06/18/2019   ALBUMIN 5.1 01/20/2017   CALCIUM 12.0 (H) 01/28/2020   ANIONGAP 8 08/15/2017   Lab Results  Component Value Date   CHOL 164 06/18/2019   Lab Results  Component Value Date   HDL 40 (L) 06/18/2019   Lab Results  Component Value Date   LDLCALC 101 (H) 06/18/2019   Lab Results  Component Value Date   TRIG 125 06/18/2019   Lab Results  Component Value Date   CHOLHDL 4.1 06/18/2019   Lab Results  Component Value Date   HGBA1C 5.5 06/02/2020      Assessment & Plan:   Problem List Items Addressed This Visit      Cardiovascular and Mediastinum   HYPERTENSION, BENIGN    BP borderline this AM.  She does have a home blood pressure cuff so just encouraged her to check it at home on her own may be couple times a week over the next month.  Shooting for blood pressures with a systolic in the 779T.  If she still seeing a lot of upper 130s then please call me and let me know so we can make an adjustment to her blood pressure regimen. We did go to refill her amlodipine today.  Continue work on low-salt diet and regular activity.      Relevant Medications   amLODipine (NORVASC) 5 MG tablet   Other Relevant Orders   POCT glycosylated hemoglobin (Hb A1C) (Completed)   COMPLETE METABOLIC PANEL WITH GFR   Lipid panel   Hot flashes    She  also overall they are fairly well controlled on the Effexor.  She is happy with her regimen.      Relevant Medications   amLODipine (NORVASC) 5 MG tablet     Endocrine   Controlled type 2 diabetes mellitus without complication, without long-term current use of insulin (HCC) - Primary    Hemoglobin A1c looks phenomenal today at 5.5 she is actually kept it under 6.0 for quite some time so we can extend visit to 6 months.  She still continuing to take Metformin regularly. OK to dec metformin to 1 tab daily      Relevant Medications   metFORMIN (GLUCOPHAGE) 500 MG tablet   Other Relevant Orders   POCT glycosylated hemoglobin (Hb A1C) (Completed)   COMPLETE METABOLIC PANEL WITH GFR   Lipid panel     Other   Hyperlipidemia    Due for lipids.        Relevant Medications   amLODipine (NORVASC) 5 MG tablet   Other Relevant Orders   POCT glycosylated hemoglobin (Hb A1C) (Completed)   COMPLETE METABOLIC PANEL WITH GFR   Lipid panel    Other Visit Diagnoses    Need for Zostavax administration       Relevant Orders   Varicella-zoster vaccine IM (Shingrix) (Completed)   Need for immunization against influenza       Relevant Orders   Flu Vaccine QUAD 36+ mos IM (Completed)  Meds ordered this encounter  Medications  . amLODipine (NORVASC) 5 MG tablet    Sig: Take 1 tablet (5 mg total) by mouth daily.    Dispense:  90 tablet    Refill:  1  . metFORMIN (GLUCOPHAGE) 500 MG tablet    Sig: Take 1 tablet (500 mg total) by mouth daily with breakfast.    Dispense:  90 tablet    Refill:  1    Follow-up: Return in about 6 months (around 11/30/2020) for Hypertension and A1C.    Beatrice Lecher, MD

## 2020-06-02 NOTE — Assessment & Plan Note (Addendum)
BP borderline this AM.  She does have a home blood pressure cuff so just encouraged her to check it at home on her own may be couple times a week over the next month.  Shooting for blood pressures with a systolic in the 712H.  If she still seeing a lot of upper 130s then please call me and let me know so we can make an adjustment to her blood pressure regimen. We did go to refill her amlodipine today.  Continue work on low-salt diet and regular activity.

## 2020-06-02 NOTE — Assessment & Plan Note (Signed)
Due for lipids.  

## 2020-06-02 NOTE — Assessment & Plan Note (Signed)
Hemoglobin A1c looks phenomenal today at 5.5 she is actually kept it under 6.0 for quite some time so we can extend visit to 6 months.  She still continuing to take Metformin regularly. OK to dec metformin to 1 tab daily

## 2020-06-02 NOTE — Assessment & Plan Note (Signed)
She also overall they are fairly well controlled on the Effexor.  She is happy with her regimen.

## 2020-06-03 LAB — COMPLETE METABOLIC PANEL WITH GFR
AG Ratio: 2.1 (calc) (ref 1.0–2.5)
ALT: 34 U/L — ABNORMAL HIGH (ref 6–29)
AST: 26 U/L (ref 10–35)
Albumin: 4.6 g/dL (ref 3.6–5.1)
Alkaline phosphatase (APISO): 26 U/L — ABNORMAL LOW (ref 37–153)
BUN: 16 mg/dL (ref 7–25)
CO2: 30 mmol/L (ref 20–32)
Calcium: 10.2 mg/dL (ref 8.6–10.4)
Chloride: 101 mmol/L (ref 98–110)
Creat: 0.87 mg/dL (ref 0.50–1.05)
GFR, Est African American: 85 mL/min/{1.73_m2} (ref >=60)
GFR, Est Non African American: 73 mL/min/{1.73_m2} (ref >=60)
Globulin: 2.2 g/dL (calc) (ref 1.9–3.7)
Glucose, Bld: 111 mg/dL — ABNORMAL HIGH (ref 65–99)
Potassium: 3.8 mmol/L (ref 3.5–5.3)
Sodium: 139 mmol/L (ref 135–146)
Total Bilirubin: 0.4 mg/dL (ref 0.2–1.2)
Total Protein: 6.8 g/dL (ref 6.1–8.1)

## 2020-06-03 LAB — LIPID PANEL
Cholesterol: 180 mg/dL (ref <200)
HDL: 49 mg/dL — ABNORMAL LOW (ref >=50)
LDL Cholesterol (Calc): 104 mg/dL (calc) — ABNORMAL HIGH
Non-HDL Cholesterol (Calc): 131 mg/dL (calc) — ABNORMAL HIGH (ref <130)
Total CHOL/HDL Ratio: 3.7 (calc) (ref <5.0)
Triglycerides: 152 mg/dL — ABNORMAL HIGH (ref <150)

## 2020-06-12 ENCOUNTER — Other Ambulatory Visit: Payer: Self-pay | Admitting: Family Medicine

## 2020-06-12 DIAGNOSIS — E119 Type 2 diabetes mellitus without complications: Secondary | ICD-10-CM

## 2020-06-18 ENCOUNTER — Other Ambulatory Visit: Payer: Self-pay | Admitting: Family Medicine

## 2020-07-27 ENCOUNTER — Other Ambulatory Visit: Payer: Self-pay | Admitting: Family Medicine

## 2020-07-27 DIAGNOSIS — I10 Essential (primary) hypertension: Secondary | ICD-10-CM

## 2020-08-04 ENCOUNTER — Other Ambulatory Visit: Payer: Self-pay | Admitting: Family Medicine

## 2020-10-01 ENCOUNTER — Telehealth: Payer: Self-pay | Admitting: Family Medicine

## 2020-10-01 NOTE — Telephone Encounter (Signed)
My chart note sent.

## 2020-11-29 ENCOUNTER — Other Ambulatory Visit: Payer: Self-pay | Admitting: Family Medicine

## 2020-11-29 DIAGNOSIS — E119 Type 2 diabetes mellitus without complications: Secondary | ICD-10-CM

## 2020-11-29 DIAGNOSIS — I10 Essential (primary) hypertension: Secondary | ICD-10-CM

## 2020-12-08 ENCOUNTER — Other Ambulatory Visit: Payer: Self-pay

## 2020-12-08 ENCOUNTER — Ambulatory Visit (INDEPENDENT_AMBULATORY_CARE_PROVIDER_SITE_OTHER): Payer: BC Managed Care – PPO | Admitting: Family Medicine

## 2020-12-08 ENCOUNTER — Encounter: Payer: Self-pay | Admitting: Family Medicine

## 2020-12-08 VITALS — BP 158/82 | HR 85 | Ht 60.0 in | Wt 209.0 lb

## 2020-12-08 DIAGNOSIS — I10 Essential (primary) hypertension: Secondary | ICD-10-CM | POA: Diagnosis not present

## 2020-12-08 DIAGNOSIS — E119 Type 2 diabetes mellitus without complications: Secondary | ICD-10-CM | POA: Diagnosis not present

## 2020-12-08 DIAGNOSIS — F39 Unspecified mood [affective] disorder: Secondary | ICD-10-CM | POA: Diagnosis not present

## 2020-12-08 LAB — POCT GLYCOSYLATED HEMOGLOBIN (HGB A1C): Hemoglobin A1C: 5.9 % — AB (ref 4.0–5.6)

## 2020-12-08 MED ORDER — AMLODIPINE BESYLATE 10 MG PO TABS: 10.0000 mg | ORAL_TABLET | Freq: Every day | ORAL | 1 refills | Status: DC

## 2020-12-08 MED ORDER — FUROSEMIDE 20 MG PO TABS: 20.0000 mg | ORAL_TABLET | Freq: Every day | ORAL | 1 refills | Status: DC | PRN

## 2020-12-08 NOTE — Progress Notes (Signed)
Established Patient Office Visit  Subjective:  Patient ID: Loretta Bullock, female    DOB: [birth year]  Age: 60 y.o. MRN: [id number]  CC:  Chief Complaint  Patient presents with  . Hypertension    HPI Loretta Bullock presents for   Hypertension- Pt denies chest pain, SOB, dizziness, or heart palpitations.  Taking meds as directed w/o problems.  Denies medication side effects.    Diabetes - no hypoglycemic events. No wounds or sores that are not healing well. No increased thirst or urination. Checking glucose at home. Taking medications as prescribed without any side effects.  F/U Mood - currently on zoloft.  She has been under some stress with her mother who has been having some health problems and has had to move in with her sister.    She would like a refill on furosemide she uses it very sparingly probably less than once a month based on refill history but she would like a refill on the medication  Past Medical History:  Diagnosis Date  . Anxiety   . DDD (degenerative disc disease), cervical   . History of lumbar laminectomy   . Hypertension   . PONV (postoperative nausea and vomiting)   . Sleep apnea    CPAP 7 cm water    Past Surgical History:  Procedure Laterality Date  . BACK SURGERY    . CESAREAN SECTION  2002  . LUMBAR DISC SURGERY  06/2013   Dr. Elliot Dally  . LUMBAR FUSION  08/17/2017  . MAXIMUM ACCESS (MAS)POSTERIOR LUMBAR INTERBODY FUSION (PLIF) 1 LEVEL  2016    Family History  Problem Relation Age of Onset  . Hyperlipidemia Father   . Hyperlipidemia Mother   . Hyperlipidemia Brother   . Diabetes Maternal Grandmother   . Colon cancer Maternal Aunt     Social History   Socioeconomic History  . Marital status: Married    Spouse name: Not on file  . Number of children: Not on file  . Years of education: Not on file  . Highest education level: Not on file  Occupational History  . Not on file  Tobacco Use  . Smoking status: Former Research scientist (life sciences)  .  Smokeless tobacco: Never Used  Vaping Use  . Vaping Use: Never used  Substance and Sexual Activity  . Alcohol use: Yes    Comment: occ  . Drug use: No  . Sexual activity: Yes    Birth control/protection: Post-menopausal  Other Topics Concern  . Not on file  Social History Narrative  . Not on file   Social Determinants of Health   Financial Resource Strain: Not on file  Food Insecurity: Not on file  Transportation Needs: Not on file  Physical Activity: Not on file  Stress: Not on file  Social Connections: Not on file  Intimate Partner Violence: Not on file    Outpatient Medications Prior to Visit  Medication Sig Dispense Refill  . Accu-Chek FastClix Lancets MISC USE 4 TIMES A DAY 102 each prn  . acetaminophen (TYLENOL) 500 MG tablet Take 1,000 mg every 6 (six) hours as needed by mouth for moderate pain or headache.    . AMBULATORY NON FORMULARY MEDICATION Medication Name: CPAP  set to 7 cm of water. Please fit for new facemask with tubing. Humidifier. Diagnosis moderate obstructive sleep apnea. Please see sleep study from 01/27/2005. 1 vial 0  . blood glucose meter kit and supplies Dispense based on patient and insurance preference. Use up to four times daily as  directed. Dx:E11.65. 1 each 0  . Choline Fenofibrate (FENOFIBRIC ACID) 135 MG CPDR TAKE 1 CAPSULE EVERY DAY 90 capsule 3  . estradiol (VIVELLE-DOT) 0.05 MG/24HR patch PLACE 1 PATCH (0.05 MG TOTAL) ONTO THE SKIN 2 (TWO) TIMES A WEEK. 24 patch 1  . fexofenadine (ALLEGRA) 180 MG tablet Take 180 mg by mouth daily.    Marland Kitchen glucose blood (ACCU-CHEK GUIDE) test strip To be used for testing up to 4 times daily. Dx:E11.65 400 each 3  . Ketotifen Fumarate (ALAWAY OP) Place 1 drop 2 (two) times daily into both eyes.    . metFORMIN (GLUCOPHAGE) 500 MG tablet TAKE 1 TABLET BY MOUTH EVERY DAY WITH BREAKFAST 90 tablet 1  . Multiple Vitamin (MULTIVITAMIN) capsule Take 1 capsule by mouth daily.    Marland Kitchen omega-3 acid ethyl esters (LOVAZA) 1 g  capsule TAKE 1 CAPSULE (1 G TOTAL) BY MOUTH 2 (TWO) TIMES DAILY. 180 capsule 3  . progesterone (PROMETRIUM) 200 MG capsule TAKE 1 CAPSULE BY MOUTH DAILY FOR 2 WEEKS EVERY OTHER MONTH 42 capsule 2  . valsartan-hydrochlorothiazide (DIOVAN-HCT) 320-25 MG tablet TAKE 1 TABLET BY MOUTH EVERY DAY 90 tablet 1  . venlafaxine XR (EFFEXOR-XR) 75 MG 24 hr capsule TAKE 1 CAPSULE BY MOUTH EVERY DAY 90 capsule 1  . amLODipine (NORVASC) 5 MG tablet TAKE 1 TABLET BY MOUTH EVERY DAY 90 tablet 1   No facility-administered medications prior to visit.    No Known Allergies  ROS Review of Systems    Objective:    Physical Exam Constitutional:      Appearance: She is well-developed.  HENT:     Head: Normocephalic and atraumatic.  Cardiovascular:     Rate and Rhythm: Normal rate and regular rhythm.     Heart sounds: Normal heart sounds.  Pulmonary:     Effort: Pulmonary effort is normal.     Breath sounds: Normal breath sounds.  Skin:    General: Skin is warm and dry.  Neurological:     Mental Status: She is alert and oriented to person, place, and time.  Psychiatric:        Behavior: Behavior normal.     BP (!) 158/82   Pulse 85   Ht 5' (1.524 m)   Wt 209 lb (94.8 kg)   LMP 12/21/2012   SpO2 96%   BMI 40.82 kg/m  Wt Readings from Last 3 Encounters:  12/08/20 209 lb (94.8 kg)  06/02/20 204 lb (92.5 kg)  01/28/20 209 lb (94.8 kg)     Health Maintenance Due  Topic Date Due  . COVID-19 Vaccine (3 - Booster for Moderna series) 08/20/2020    There are no preventive care reminders to display for this patient.  Lab Results  Component Value Date   TSH 4.31 06/18/2019   Lab Results  Component Value Date   WBC 3.9 (L) 08/15/2017   HGB 14.1 08/15/2017   HCT 40.9 08/15/2017   MCV 85.4 08/15/2017   PLT 236 08/15/2017   Lab Results  Component Value Date   NA 139 06/02/2020   K 3.8 06/02/2020   CO2 30 06/02/2020   GLUCOSE 111 (H) 06/02/2020   BUN 16 06/02/2020   CREATININE  0.87 06/02/2020   BILITOT 0.4 06/02/2020   ALKPHOS 33 01/20/2017   AST 26 06/02/2020   ALT 34 (H) 06/02/2020   PROT 6.8 06/02/2020   ALBUMIN 5.1 01/20/2017   CALCIUM 10.2 06/02/2020   ANIONGAP 8 08/15/2017   Lab Results  Component Value  Date   CHOL 180 06/02/2020   Lab Results  Component Value Date   HDL 49 (L) 06/02/2020   Lab Results  Component Value Date   LDLCALC 104 (H) 06/02/2020   Lab Results  Component Value Date   TRIG 152 (H) 06/02/2020   Lab Results  Component Value Date   CHOLHDL 3.7 06/02/2020   Lab Results  Component Value Date   HGBA1C 5.9 (A) 12/08/2020      Assessment & Plan:   Problem List Items Addressed This Visit      Cardiovascular and Mediastinum   HYPERTENSION, BENIGN - Primary    Uncontrolled. Inc amlodipine to $RemoveBefor'10mg'[id number]$ . F/U in 2 weeks.       Relevant Medications   amLODipine (NORVASC) 10 MG tablet   furosemide (LASIX) 20 MG tablet   Other Relevant Orders   BASIC METABOLIC PANEL WITH GFR     Endocrine   Controlled type 2 diabetes mellitus without complication, without long-term current use of insulin (HCC)    Well controlled. Continue current regimen. Follow up in  3 mo  Lab Results  Component Value Date   HGBA1C 5.9 (A) 12/08/2020         Relevant Orders   POCT glycosylated hemoglobin (Hb A1C) (Completed)     Other   Episodic mood disorder ([place])    Even with inc stress she is happy with her current regimen.           Meds ordered this encounter  Medications  . amLODipine (NORVASC) 10 MG tablet    Sig: Take 1 tablet (10 mg total) by mouth daily.    Dispense:  90 tablet    Refill:  1  . furosemide (LASIX) 20 MG tablet    Sig: Take 1 tablet (20 mg total) by mouth daily as needed for edema.    Dispense:  30 tablet    Refill:  1    Follow-up: Return in about 2 weeks (around 12/22/2020) for Nurse visit for BP.    Beatrice Lecher, MD

## 2020-12-08 NOTE — Assessment & Plan Note (Addendum)
Uncontrolled. Inc amlodipine to 10mg . F/U in 2 weeks.

## 2020-12-08 NOTE — Assessment & Plan Note (Signed)
Well controlled. Continue current regimen. Follow up in  3 mo  Lab Results  Component Value Date   HGBA1C 5.9 (A) 12/08/2020

## 2020-12-08 NOTE — Assessment & Plan Note (Signed)
Even with inc stress she is happy with her current regimen.

## 2020-12-08 NOTE — Patient Instructions (Signed)
We will bump up your amlodiopine to 10mg  and check you BP in two weeks with a nurse visit.

## 2020-12-22 ENCOUNTER — Ambulatory Visit: Payer: BC Managed Care – PPO

## 2020-12-25 ENCOUNTER — Ambulatory Visit (INDEPENDENT_AMBULATORY_CARE_PROVIDER_SITE_OTHER): Payer: BC Managed Care – PPO | Admitting: Family Medicine

## 2020-12-25 ENCOUNTER — Other Ambulatory Visit: Payer: Self-pay

## 2020-12-25 VITALS — BP 119/61 | HR 98 | Temp 97.8°F | Resp 20 | Ht 60.0 in | Wt 208.0 lb

## 2020-12-25 DIAGNOSIS — I10 Essential (primary) hypertension: Secondary | ICD-10-CM | POA: Diagnosis not present

## 2020-12-25 NOTE — Progress Notes (Signed)
Established Patient Office Visit  Subjective:  Patient ID: Loretta Bullock, female    DOB: Feb 07, 1961  Age: 60 y.o. MRN: [id number]  CC:  Chief Complaint  Patient presents with  . Hypertension    HPI Loretta Bullock presents for a BP check. First BP 130/63, second BP 119/61.  Past Medical History:  Diagnosis Date  . Anxiety   . DDD (degenerative disc disease), cervical   . History of lumbar laminectomy   . Hypertension   . PONV (postoperative nausea and vomiting)   . Sleep apnea    CPAP 7 cm water    Past Surgical History:  Procedure Laterality Date  . BACK SURGERY    . CESAREAN SECTION  2002  . LUMBAR DISC SURGERY  06/2013   Dr. Elliot Dally  . LUMBAR FUSION  08/17/2017  . MAXIMUM ACCESS (MAS)POSTERIOR LUMBAR INTERBODY FUSION (PLIF) 1 LEVEL  2016    Family History  Problem Relation Age of Onset  . Hyperlipidemia Father   . Hyperlipidemia Mother   . Hyperlipidemia Brother   . Diabetes Maternal Grandmother   . Colon cancer Maternal Aunt     Social History   Socioeconomic History  . Marital status: Married    Spouse name: Not on file  . Number of children: Not on file  . Years of education: Not on file  . Highest education level: Not on file  Occupational History  . Not on file  Tobacco Use  . Smoking status: Former Research scientist (life sciences)  . Smokeless tobacco: Never Used  Vaping Use  . Vaping Use: Never used  Substance and Sexual Activity  . Alcohol use: Yes    Comment: occ  . Drug use: No  . Sexual activity: Yes    Birth control/protection: Post-menopausal  Other Topics Concern  . Not on file  Social History Narrative  . Not on file   Social Determinants of Health   Financial Resource Strain: Not on file  Food Insecurity: Not on file  Transportation Needs: Not on file  Physical Activity: Not on file  Stress: Not on file  Social Connections: Not on file  Intimate Partner Violence: Not on file    Outpatient Medications Prior to Visit  Medication Sig  Dispense Refill  . Accu-Chek FastClix Lancets MISC USE 4 TIMES A DAY 102 each prn  . acetaminophen (TYLENOL) 500 MG tablet Take 1,000 mg every 6 (six) hours as needed by mouth for moderate pain or headache.    . AMBULATORY NON FORMULARY MEDICATION Medication Name: CPAP  set to 7 cm of water. Please fit for new facemask with tubing. Humidifier. Diagnosis moderate obstructive sleep apnea. Please see sleep study from 01/27/2005. 1 vial 0  . amLODipine (NORVASC) 10 MG tablet Take 1 tablet (10 mg total) by mouth daily. 90 tablet 1  . blood glucose meter kit and supplies Dispense based on patient and insurance preference. Use up to four times daily as directed. Dx:E11.65. 1 each 0  . Choline Fenofibrate (FENOFIBRIC ACID) 135 MG CPDR TAKE 1 CAPSULE EVERY DAY 90 capsule 3  . estradiol (VIVELLE-DOT) 0.05 MG/24HR patch PLACE 1 PATCH (0.05 MG TOTAL) ONTO THE SKIN 2 (TWO) TIMES A WEEK. 24 patch 1  . fexofenadine (ALLEGRA) 180 MG tablet Take 180 mg by mouth daily.    . furosemide (LASIX) 20 MG tablet Take 1 tablet (20 mg total) by mouth daily as needed for edema. 30 tablet 1  . glucose blood (ACCU-CHEK GUIDE) test strip To be used for testing  up to 4 times daily. Dx:E11.65 400 each 3  . Ketotifen Fumarate (ALAWAY OP) Place 1 drop 2 (two) times daily into both eyes.    . metFORMIN (GLUCOPHAGE) 500 MG tablet TAKE 1 TABLET BY MOUTH EVERY DAY WITH BREAKFAST 90 tablet 1  . Multiple Vitamin (MULTIVITAMIN) capsule Take 1 capsule by mouth daily.    Marland Kitchen omega-3 acid ethyl esters (LOVAZA) 1 g capsule TAKE 1 CAPSULE (1 G TOTAL) BY MOUTH 2 (TWO) TIMES DAILY. 180 capsule 3  . progesterone (PROMETRIUM) 200 MG capsule TAKE 1 CAPSULE BY MOUTH DAILY FOR 2 WEEKS EVERY OTHER MONTH 42 capsule 2  . valsartan-hydrochlorothiazide (DIOVAN-HCT) 320-25 MG tablet TAKE 1 TABLET BY MOUTH EVERY DAY 90 tablet 1  . venlafaxine XR (EFFEXOR-XR) 75 MG 24 hr capsule TAKE 1 CAPSULE BY MOUTH EVERY DAY 90 capsule 1   No facility-administered  medications prior to visit.    No Known Allergies  ROS Review of Systems    Objective:    Physical Exam  BP 130/63 (BP Location: Left Arm, Patient Position: Sitting, Cuff Size: Large)   Pulse 98   Temp 97.8 F (36.6 C) (Temporal)   Resp 20   Ht 5' (1.524 m)   Wt 208 lb (94.3 kg)   LMP 12/21/2012   SpO2 96%   BMI 40.62 kg/m  Wt Readings from Last 3 Encounters:  12/25/20 208 lb (94.3 kg)  12/08/20 209 lb (94.8 kg)  06/02/20 204 lb (92.5 kg)     Health Maintenance Due  Topic Date Due  . COVID-19 Vaccine (3 - Booster for Moderna series) 08/20/2020    There are no preventive care reminders to display for this patient.  Lab Results  Component Value Date   TSH 4.31 06/18/2019   Lab Results  Component Value Date   WBC 3.9 (L) 08/15/2017   HGB 14.1 08/15/2017   HCT 40.9 08/15/2017   MCV 85.4 08/15/2017   PLT 236 08/15/2017   Lab Results  Component Value Date   NA 139 06/02/2020   K 3.8 06/02/2020   CO2 30 06/02/2020   GLUCOSE 111 (H) 06/02/2020   BUN 16 06/02/2020   CREATININE 0.87 06/02/2020   BILITOT 0.4 06/02/2020   ALKPHOS 33 01/20/2017   AST 26 06/02/2020   ALT 34 (H) 06/02/2020   PROT 6.8 06/02/2020   ALBUMIN 5.1 01/20/2017   CALCIUM 10.2 06/02/2020   ANIONGAP 8 08/15/2017   Lab Results  Component Value Date   CHOL 180 06/02/2020   Lab Results  Component Value Date   HDL 49 (L) 06/02/2020   Lab Results  Component Value Date   LDLCALC 104 (H) 06/02/2020   Lab Results  Component Value Date   TRIG 152 (H) 06/02/2020   Lab Results  Component Value Date   CHOLHDL 3.7 06/02/2020   Lab Results  Component Value Date   HGBA1C 5.9 (A) 12/08/2020      Assessment & Plan:  Continue with current medications. Follow up in 3-4 months for routine diabetes appointment.  Problem List Items Addressed This Visit      Cardiovascular and Mediastinum   HYPERTENSION, BENIGN - Primary      No orders of the defined types were placed in this  encounter.   Follow-up: No follow-ups on file.    Ninfa Meeker, CMA

## 2020-12-25 NOTE — Progress Notes (Signed)
Hypertension-blood pressure looks fantastic today.  Continue current regimen.  Keep regularly scheduled follow-up in about 3 months for diabetic checkup.

## 2021-01-01 ENCOUNTER — Other Ambulatory Visit: Payer: Self-pay | Admitting: Family Medicine

## 2021-01-07 ENCOUNTER — Other Ambulatory Visit: Payer: Self-pay | Admitting: Family Medicine

## 2021-02-02 ENCOUNTER — Other Ambulatory Visit: Payer: Self-pay | Admitting: Family Medicine

## 2021-02-02 DIAGNOSIS — I10 Essential (primary) hypertension: Secondary | ICD-10-CM

## 2021-03-03 ENCOUNTER — Other Ambulatory Visit: Payer: Self-pay | Admitting: Family Medicine

## 2021-03-03 DIAGNOSIS — I10 Essential (primary) hypertension: Secondary | ICD-10-CM

## 2021-04-02 ENCOUNTER — Other Ambulatory Visit: Payer: Self-pay | Admitting: Family Medicine

## 2021-04-02 DIAGNOSIS — I10 Essential (primary) hypertension: Secondary | ICD-10-CM

## 2021-04-07 ENCOUNTER — Other Ambulatory Visit: Payer: Self-pay | Admitting: Family Medicine

## 2021-05-28 ENCOUNTER — Other Ambulatory Visit: Payer: Self-pay | Admitting: Family Medicine

## 2021-05-28 DIAGNOSIS — I10 Essential (primary) hypertension: Secondary | ICD-10-CM

## 2021-05-28 DIAGNOSIS — E119 Type 2 diabetes mellitus without complications: Secondary | ICD-10-CM

## 2021-05-28 MED ORDER — AMLODIPINE BESYLATE 10 MG PO TABS: 10.0000 mg | ORAL_TABLET | Freq: Every day | ORAL | 0 refills | Status: DC

## 2021-05-28 NOTE — Telephone Encounter (Signed)
Please call pt  and advise her to call and schedule f/u for bp and dm. 30 day supply sent to pharmacy.

## 2021-05-28 NOTE — Telephone Encounter (Signed)
Please call pt and advise her to schedule f/u for bp and dm. Thanks. 30 day supply sent to pharmacy

## 2021-05-28 NOTE — Telephone Encounter (Signed)
LVM for pt informing no more refills until she has a follow up

## 2021-06-09 ENCOUNTER — Other Ambulatory Visit: Payer: Self-pay | Admitting: Family Medicine

## 2021-06-12 ENCOUNTER — Encounter: Payer: Self-pay | Admitting: Family Medicine

## 2021-06-22 ENCOUNTER — Ambulatory Visit (INDEPENDENT_AMBULATORY_CARE_PROVIDER_SITE_OTHER): Payer: BC Managed Care – PPO | Admitting: Family Medicine

## 2021-06-22 ENCOUNTER — Other Ambulatory Visit: Payer: Self-pay

## 2021-06-22 ENCOUNTER — Encounter: Payer: Self-pay | Admitting: Family Medicine

## 2021-06-22 VITALS — BP 142/80 | HR 85 | Temp 98.0°F | Ht 60.0 in | Wt 207.0 lb

## 2021-06-22 DIAGNOSIS — I10 Essential (primary) hypertension: Secondary | ICD-10-CM

## 2021-06-22 DIAGNOSIS — Z23 Encounter for immunization: Secondary | ICD-10-CM

## 2021-06-22 DIAGNOSIS — F39 Unspecified mood [affective] disorder: Secondary | ICD-10-CM

## 2021-06-22 DIAGNOSIS — E119 Type 2 diabetes mellitus without complications: Secondary | ICD-10-CM

## 2021-06-22 DIAGNOSIS — E785 Hyperlipidemia, unspecified: Secondary | ICD-10-CM | POA: Diagnosis not present

## 2021-06-22 LAB — POCT GLYCOSYLATED HEMOGLOBIN (HGB A1C): Hemoglobin A1C: 6.2 % — AB (ref 4.0–5.6)

## 2021-06-22 MED ORDER — VENLAFAXINE HCL ER 75 MG PO CP24: ORAL_CAPSULE | ORAL | 1 refills | Status: DC

## 2021-06-22 MED ORDER — AMLODIPINE BESYLATE 10 MG PO TABS: 10.0000 mg | ORAL_TABLET | Freq: Every day | ORAL | 1 refills | Status: DC

## 2021-06-22 MED ORDER — METFORMIN HCL 500 MG PO TABS: 500.0000 mg | ORAL_TABLET | Freq: Every day | ORAL | 1 refills | Status: DC

## 2021-06-22 MED ORDER — VALSARTAN-HYDROCHLOROTHIAZIDE 320-25 MG PO TABS: 1.0000 | ORAL_TABLET | Freq: Every day | ORAL | 1 refills | Status: DC

## 2021-06-22 NOTE — Assessment & Plan Note (Signed)
Even with everything she is gone through this year with her mother she feels that she is doing well on her medication.  Follow-up in 4 months.

## 2021-06-22 NOTE — Patient Instructions (Signed)
Influenza (Flu) Vaccine (Inactivated or Recombinant): What You Need to Know 1. Why get vaccinated? Influenza vaccine can prevent influenza (flu). Flu is a contagious disease that spreads around the [place] every year, usually between October and May. Anyone can get the flu, but it is more dangerous for some people. Infants and young children, people 65 years and older, pregnant people, and people with certain health conditions or a weakened immune system are at greatest risk of flu complications. Pneumonia, bronchitis, sinus infections, and ear infections are examples of flu-related complications. If you have a medical condition, such as heart disease, cancer, or diabetes, flu can make it worse. Flu can cause fever and chills, sore throat, muscle aches, fatigue, cough, headache, and runny or stuffy nose. Some people may have vomiting and diarrhea, though this is more common in children than adults. In an average year, thousands of people in the [place] die from flu, and many more are hospitalized. Flu vaccine prevents millions of illnesses and flu-related visits to the doctor each year. 2. Influenza vaccines CDC recommends everyone 6 months and older get vaccinated every flu season. Children 6 months through 8 years of age may need 2 doses during a single flu season. Everyone else needs only 1 dose each flu season. It takes about 2 weeks for protection to develop after vaccination. There are many flu viruses, and they are always changing. Each year a new flu vaccine is made to protect against the influenza viruses believed to be likely to cause disease in the upcoming flu season. Even when the vaccine doesn't exactly match these viruses, it may still provide some protection. Influenza vaccine does not cause flu. Influenza vaccine may be given at the same time as other vaccines. 3. Talk with your health care provider Tell your vaccination provider if the person getting the vaccine: Has had  an allergic reaction after a previous dose of influenza vaccine, or has any severe, life-threatening allergies Has ever had Guillain-Barr Syndrome (also called "GBS") In some cases, your health care provider may decide to postpone influenza vaccination until a future visit. Influenza vaccine can be administered at any time during pregnancy. People who are or will be pregnant during influenza season should receive inactivated influenza vaccine. People with minor illnesses, such as a cold, may be vaccinated. People who are moderately or severely ill should usually wait until they recover before getting influenza vaccine. Your health care provider can give you more information. 4. Risks of a vaccine reaction Soreness, redness, and swelling where the shot is given, fever, muscle aches, and headache can happen after influenza vaccination. There may be a very small increased risk of Guillain-Barr Syndrome (GBS) after inactivated influenza vaccine (the flu shot). Young children who get the flu shot along with pneumococcal vaccine (PCV13) and/or DTaP vaccine at the same time might be slightly more likely to have a seizure caused by fever. Tell your health care provider if a child who is getting flu vaccine has ever had a seizure. People sometimes faint after medical procedures, including vaccination. Tell your provider if you feel dizzy or have vision changes or ringing in the ears. As with any medicine, there is a very remote chance of a vaccine causing a severe allergic reaction, other serious injury, or death. 5. What if there is a serious problem? An allergic reaction could occur after the vaccinated person leaves the clinic. If you see signs of a severe allergic reaction (hives, swelling of the face and throat, difficulty breathing,   a fast heartbeat, dizziness, or weakness), call 9-1-1 and get the person to the nearest hospital. For other signs that concern you, call your health care provider. Adverse  reactions should be reported to the Vaccine Adverse Event Reporting System (VAERS). Your health care provider will usually file this report, or you can do it yourself. Visit the VAERS website at www.vaers.hhs.gov or call 1-800-822-7967. VAERS is only for reporting reactions, and VAERS staff members do not give medical advice. 6. The National Vaccine Injury Compensation Program The National Vaccine Injury Compensation Program (VICP) is a federal program that was created to compensate people who may have been injured by certain vaccines. Claims regarding alleged injury or death due to vaccination have a time limit for filing, which may be as short as two years. Visit the VICP website at www.hrsa.gov/vaccinecompensation or call 1-800-338-2382 to learn about the program and about filing a claim. 7. How can I learn more? Ask your health care provider. Call your local or state health department. Visit the website of the Food and Drug Administration (FDA) for vaccine package inserts and additional information at www.fda.gov/vaccines-blood-biologics/vaccines. Contact the Centers for Disease Control and Prevention (CDC): Call 1-800-232-4636 (1-800-CDC-INFO) or Visit CDC's website at www.cdc.gov/flu. Vaccine Information Statement Inactivated Influenza Vaccine (04/25/2020) This information is not intended to replace advice given to you by your health care provider. Make sure you discuss any questions you have with your health care provider. Document Revised: 06/12/2020 Document Reviewed: 06/12/2020 Elsevier Patient Education  2022 [employer]  

## 2021-06-22 NOTE — Progress Notes (Signed)
Established Patient Office Visit  Subjective:  Patient ID: Loretta Bullock, female    DOB: 04-24-1961  Age: 60 y.o. MRN: [id number]  CC:  Chief Complaint  Patient presents with   Diabetes   Hypertension     HPI Loretta Bullock presents for follow-up.  This is been a really stressful year for her.  Her mother has continued to decline and had C. difficile for most 2 months it seems to be getting a lot better.  Her mother is now living with her sister but she goes on the weekends to really try to relieve her sister to give her some time to do what she needs to do.  She really has not been taking great care of herself in regards to dietary choices or getting to the gym since she has been spending a lot of her free time caretaking with her mother.  She did have an exercise partner for a while.  Hypertension- Pt denies chest pain, SOB, dizziness, or heart palpitations.  Taking meds as directed w/o problems.  Denies medication side effects.    Diabetes - no hypoglycemic events. No wounds or sores that are not healing well. No increased thirst or urination. Checking glucose at home. Taking medications as prescribed without any side effects.  Hyperlipidemia - tolerating stating well with no myalgias or significant side effects.  Lab Results  Component Value Date   CHOL 180 06/02/2020   HDL 49 (L) 06/02/2020   LDLCALC 104 (H) 06/02/2020   TRIG 152 (H) 06/02/2020   CHOLHDL 3.7 06/02/2020      Past Medical History:  Diagnosis Date   Anxiety    DDD (degenerative disc disease), cervical    History of lumbar laminectomy    Hypertension    PONV (postoperative nausea and vomiting)    Sleep apnea    CPAP 7 cm water     Outpatient Medications Prior to Visit  Medication Sig Dispense Refill   Accu-Chek FastClix Lancets MISC USE 4 TIMES A DAY 102 each prn   acetaminophen (TYLENOL) 500 MG tablet Take 1,000 mg every 6 (six) hours as needed by mouth for moderate pain or headache.     AMBULATORY  NON FORMULARY MEDICATION Medication Name: CPAP  set to 7 cm of water. Please fit for new facemask with tubing. Humidifier. Diagnosis moderate obstructive sleep apnea. Please see sleep study from 01/27/2005. 1 vial 0   blood glucose meter kit and supplies Dispense based on patient and insurance preference. Use up to four times daily as directed. Dx:E11.65. 1 each 0   Choline Fenofibrate (FENOFIBRIC ACID) 135 MG CPDR Take 1 capsule by mouth daily. Needs fasting labs. 30 capsule 0   fexofenadine (ALLEGRA) 180 MG tablet Take 180 mg by mouth daily.     furosemide (LASIX) 20 MG tablet Take 1 tablet (20 mg total) by mouth daily as needed for edema. 30 tablet 1   glucose blood (ACCU-CHEK GUIDE) test strip To be used for testing up to 4 times daily. Dx:E11.65 400 each 3   Ketotifen Fumarate (ALAWAY OP) Place 1 drop 2 (two) times daily into both eyes.     Multiple Vitamin (MULTIVITAMIN) capsule Take 1 capsule by mouth daily.     omega-3 acid ethyl esters (LOVAZA) 1 g capsule TAKE 1 CAPSULE (1 G TOTAL) BY MOUTH 2 (TWO) TIMES DAILY. 180 capsule 3   progesterone (PROMETRIUM) 200 MG capsule TAKE 1 CAPSULE BY MOUTH DAILY FOR 2 WEEKS EVERY OTHER MONTH 42 capsule 2  amLODipine (NORVASC) 10 MG tablet Take 1 tablet (10 mg total) by mouth daily. 30 day supply given. Please call office to schedule an appointment. 30 tablet 0   metFORMIN (GLUCOPHAGE) 500 MG tablet Take 1 tablet (500 mg total) by mouth daily with breakfast. 30 day supply given. Please call office to schedule an appointment. 30 tablet 0   valsartan-hydrochlorothiazide (DIOVAN-HCT) 320-25 MG tablet TAKE 1 TABLET BY MOUTH EVERY DAY 90 tablet 0   venlafaxine XR (EFFEXOR-XR) 75 MG 24 hr capsule TAKE 1 CAPSULE BY MOUTH EVERY DAY 90 capsule 1   estradiol (VIVELLE-DOT) 0.05 MG/24HR patch PLACE 1 PATCH (0.05 MG TOTAL) ONTO THE SKIN 2 (TWO) TIMES A WEEK. 24 patch 1   No facility-administered medications prior to visit.    No Known Allergies  ROS Review of  Systems    Objective:    Physical Exam Constitutional:      Appearance: Normal appearance. She is well-developed.  HENT:     Head: Normocephalic and atraumatic.  Cardiovascular:     Rate and Rhythm: Normal rate and regular rhythm.     Heart sounds: Normal heart sounds.  Pulmonary:     Effort: Pulmonary effort is normal.     Breath sounds: Normal breath sounds.  Skin:    General: Skin is warm and dry.  Neurological:     Mental Status: She is alert and oriented to person, place, and time.  Psychiatric:        Behavior: Behavior normal.    BP (!) 142/80   Pulse 85   Temp 98 F (36.7 C)   Ht 5' (1.524 m)   Wt 207 lb (93.9 kg)   LMP 12/21/2012   SpO2 98%   BMI 40.43 kg/m  Wt Readings from Last 3 Encounters:  06/22/21 207 lb (93.9 kg)  12/25/20 208 lb (94.3 kg)  12/08/20 209 lb (94.8 kg)     Health Maintenance Due  Topic Date Due   OPHTHALMOLOGY EXAM  12/28/2019    There are no preventive care reminders to display for this patient.  Lab Results  Component Value Date   TSH 4.31 06/18/2019   Lab Results  Component Value Date   WBC 3.9 (L) 08/15/2017   HGB 14.1 08/15/2017   HCT 40.9 08/15/2017   MCV 85.4 08/15/2017   PLT 236 08/15/2017   Lab Results  Component Value Date   NA 139 06/02/2020   K 3.8 06/02/2020   CO2 30 06/02/2020   GLUCOSE 111 (H) 06/02/2020   BUN 16 06/02/2020   CREATININE 0.87 06/02/2020   BILITOT 0.4 06/02/2020   ALKPHOS 33 01/20/2017   AST 26 06/02/2020   ALT 34 (H) 06/02/2020   PROT 6.8 06/02/2020   ALBUMIN 5.1 01/20/2017   CALCIUM 10.2 06/02/2020   ANIONGAP 8 08/15/2017   Lab Results  Component Value Date   CHOL 180 06/02/2020   Lab Results  Component Value Date   HDL 49 (L) 06/02/2020   Lab Results  Component Value Date   LDLCALC 104 (H) 06/02/2020   Lab Results  Component Value Date   TRIG 152 (H) 06/02/2020   Lab Results  Component Value Date   CHOLHDL 3.7 06/02/2020   Lab Results  Component Value Date    HGBA1C 6.2 (A) 06/22/2021      Assessment & Plan:   Problem List Items Addressed This Visit       Cardiovascular and Mediastinum   HYPERTENSION, BENIGN    BP a little above  optimal. Encouraged her to track at home.  Goal for SBP < 130.        Relevant Medications   amLODipine (NORVASC) 10 MG tablet   valsartan-hydrochlorothiazide (DIOVAN-HCT) 320-25 MG tablet   Other Relevant Orders   POCT glycosylated hemoglobin (Hb A1C) (Completed)   COMPLETE METABOLIC PANEL WITH GFR   Lipid Panel w/reflex Direct LDL   CBC     Endocrine   Controlled type 2 diabetes mellitus without complication, without long-term current use of insulin ([place]) - Primary    Well controlled. Continue current regimen. Follow up in 4 mo        Relevant Medications   valsartan-hydrochlorothiazide (DIOVAN-HCT) 320-25 MG tablet   metFORMIN (GLUCOPHAGE) 500 MG tablet   Other Relevant Orders   POCT glycosylated hemoglobin (Hb A1C) (Completed)   COMPLETE METABOLIC PANEL WITH GFR   Lipid Panel w/reflex Direct LDL   CBC     Other   Hyperlipidemia   Relevant Medications   amLODipine (NORVASC) 10 MG tablet   valsartan-hydrochlorothiazide (DIOVAN-HCT) 320-25 MG tablet   Other Relevant Orders   Lipid Panel w/reflex Direct LDL   Episodic mood disorder ([place])    Even with everything she is gone through this year with her mother she feels that she is doing well on her medication.  Follow-up in 4 months.      Relevant Medications   venlafaxine XR (EFFEXOR-XR) 75 MG 24 hr capsule   Other Visit Diagnoses     Need for influenza vaccination       Relevant Orders   Flu Vaccine QUAD 61mo+IM (Fluarix, Fluzone & Alfiuria Quad PF) (Completed)      Encouraged to schedule an eye exam.  She says that her work location changed and there is a new I placed a little bit down from where she works so she is going to try to get in there.  She did have COVID last month.   Meds ordered this encounter  Medications    amLODipine (NORVASC) 10 MG tablet    Sig: Take 1 tablet (10 mg total) by mouth daily.    Dispense:  90 tablet    Refill:  1   venlafaxine XR (EFFEXOR-XR) 75 MG 24 hr capsule    Sig: TAKE 1 CAPSULE BY MOUTH EVERY DAY    Dispense:  90 capsule    Refill:  1   valsartan-hydrochlorothiazide (DIOVAN-HCT) 320-25 MG tablet    Sig: Take 1 tablet by mouth daily.    Dispense:  90 tablet    Refill:  1   metFORMIN (GLUCOPHAGE) 500 MG tablet    Sig: Take 1 tablet (500 mg total) by mouth daily with breakfast.    Dispense:  90 tablet    Refill:  1     Follow-up: Return in about 4 months (around 10/23/2021) for Diabetes follow-up.    Beatrice Lecher, MD

## 2021-06-22 NOTE — Assessment & Plan Note (Signed)
Well controlled. Continue current regimen. Follow up in  4 mo 

## 2021-06-22 NOTE — Assessment & Plan Note (Signed)
BP a little above optimal. Encouraged her to track at home.  Goal for SBP < 130.

## 2021-06-23 LAB — LIPID PANEL W/REFLEX DIRECT LDL
Cholesterol: 165 mg/dL (ref <200)
HDL: 48 mg/dL — ABNORMAL LOW (ref >=50)
LDL Cholesterol (Calc): 96 mg/dL (calc)
Non-HDL Cholesterol (Calc): 117 mg/dL (calc) (ref <130)
Total CHOL/HDL Ratio: 3.4 (calc) (ref <5.0)
Triglycerides: 116 mg/dL (ref <150)

## 2021-06-23 LAB — COMPLETE METABOLIC PANEL WITH GFR
AG Ratio: 2.1 (calc) (ref 1.0–2.5)
ALT: 38 U/L — ABNORMAL HIGH (ref 6–29)
AST: 32 U/L (ref 10–35)
Albumin: 4.6 g/dL (ref 3.6–5.1)
Alkaline phosphatase (APISO): 31 U/L — ABNORMAL LOW (ref 37–153)
BUN: 16 mg/dL (ref 7–25)
CO2: 29 mmol/L (ref 20–32)
Calcium: 9.8 mg/dL (ref 8.6–10.4)
Chloride: 103 mmol/L (ref 98–110)
Creat: 0.84 mg/dL (ref 0.50–1.05)
Globulin: 2.2 g/dL (calc) (ref 1.9–3.7)
Glucose, Bld: 134 mg/dL — ABNORMAL HIGH (ref 65–99)
Potassium: 3.8 mmol/L (ref 3.5–5.3)
Sodium: 140 mmol/L (ref 135–146)
Total Bilirubin: 0.3 mg/dL (ref 0.2–1.2)
Total Protein: 6.8 g/dL (ref 6.1–8.1)
eGFR: 80 mL/min/{1.73_m2} (ref >=60)

## 2021-06-23 LAB — CBC
HCT: 39.9 % (ref 35.0–45.0)
Hemoglobin: 13.4 g/dL (ref 11.7–15.5)
MCH: 29.1 pg (ref 27.0–33.0)
MCHC: 33.6 g/dL (ref 32.0–36.0)
MCV: 86.7 fL (ref 80.0–100.0)
MPV: 9.9 fL (ref 7.5–12.5)
Platelets: 266 10*3/uL (ref 140–400)
RBC: 4.6 10*6/uL (ref 3.80–5.10)
RDW: 12.9 % (ref 11.0–15.0)
WBC: 4.2 10*3/uL (ref 3.8–10.8)

## 2021-06-24 NOTE — Progress Notes (Signed)
Hi Loretta Bullock, your ALT liver enzyme is still just mildly elevated similar to where its been the last couple of years.  Cholesterol looks a little bit better this year with LDL less than 100 which is great.  Your complete blood count is normal.  Just remember to schedule your yearly eye exam.

## 2021-07-03 ENCOUNTER — Other Ambulatory Visit: Payer: Self-pay | Admitting: Family Medicine

## 2021-08-17 ENCOUNTER — Other Ambulatory Visit: Payer: Self-pay | Admitting: Family Medicine

## 2021-11-13 ENCOUNTER — Ambulatory Visit (INDEPENDENT_AMBULATORY_CARE_PROVIDER_SITE_OTHER): Payer: BC Managed Care – PPO | Admitting: Family Medicine

## 2021-11-13 ENCOUNTER — Encounter: Payer: Self-pay | Admitting: Family Medicine

## 2021-11-13 ENCOUNTER — Other Ambulatory Visit: Payer: Self-pay

## 2021-11-13 VITALS — BP 136/67 | HR 83 | Resp 18 | Ht 60.0 in | Wt 209.0 lb

## 2021-11-13 DIAGNOSIS — Z1231 Encounter for screening mammogram for malignant neoplasm of breast: Secondary | ICD-10-CM | POA: Diagnosis not present

## 2021-11-13 DIAGNOSIS — E119 Type 2 diabetes mellitus without complications: Secondary | ICD-10-CM | POA: Diagnosis not present

## 2021-11-13 DIAGNOSIS — I10 Essential (primary) hypertension: Secondary | ICD-10-CM

## 2021-11-13 NOTE — Assessment & Plan Note (Signed)
She is concerned that her A1c may be a little bit more elevated than the last time she was here.  Unfortunately we were unable to do the A1c today because we are out of cartridges.  She will try to come back in the next week or 2 and then we can make an adjustment to her regimen if needed.  But she would really like to just have some time to focus back on lifestyle changes and not make any medication changes if possible.

## 2021-11-13 NOTE — Assessment & Plan Note (Signed)
Pete blood pressure also in the 130s so not quite ideal.  We will continue to monitor but ideally we would like the systolic less than 700.  Continue Diovan HCTZ and amlodipine.  We could consider combining these into Tribenzor if her insurance would cover it.  It looks like it is considered a nonpreferred medication which would likely result in a higher co-pay.  But we will still keep that in mind going forward.

## 2021-11-13 NOTE — Progress Notes (Signed)
Established Patient Office Visit  Subjective:  Patient ID: Loretta Bullock, female    DOB: 02-Oct-1960  Age: 61 y.o. MRN: [id number]  CC:  Chief Complaint  Patient presents with   Diabetes    Follow up    Noise in ear    Right ear   Diabetes Eye Exam    Patient currently looking for new eye dr. Aletha Halim schedule appointment   Colonoscopy    Patient will call [facility] to schedule appointment     HPI Loretta Bullock presents for   Hypertension- Pt denies chest pain, SOB, dizziness, or heart palpitations.  Taking meds as directed w/o problems.  Denies medication side effects.    Diabetes - no hypoglycemic events. No wounds or sores that are not healing well. No increased thirst or urination. Checking glucose at home. Taking medications as prescribed without any side effects.   Past Medical History:  Diagnosis Date   Anxiety    DDD (degenerative disc disease), cervical    History of lumbar laminectomy    Hypertension    PONV (postoperative nausea and vomiting)    Sleep apnea    CPAP 7 cm water    Past Surgical History:  Procedure Laterality Date   BACK SURGERY     CESAREAN SECTION  2002   [place]  06/2013   Dr. Elliot Dally   LUMBAR FUSION  08/17/2017   MAXIMUM ACCESS (MAS)POSTERIOR LUMBAR INTERBODY FUSION (PLIF) 1 LEVEL  2016    Family History  Problem Relation Age of Onset   Hyperlipidemia Father    Hyperlipidemia Mother    Hyperlipidemia Brother    Diabetes Maternal Grandmother    Colon cancer Maternal Aunt     Social History   Socioeconomic History   Marital status: Married    Spouse name: Not on file   Number of children: Not on file   Years of education: Not on file   Highest education level: Not on file  Occupational History   Not on file  Tobacco Use   Smoking status: Former   Smokeless tobacco: Never  Vaping Use   Vaping Use: Never used  Substance and Sexual Activity   Alcohol use: Yes    Comment: occ   Drug use: No    Sexual activity: Yes    Birth control/protection: Post-menopausal  Other Topics Concern   Not on file  Social History Narrative   Not on file   Social Determinants of Health   Financial Resource Strain: Not on file  Food Insecurity: Not on file  Transportation Needs: Not on file  Physical Activity: Not on file  Stress: Not on file  Social Connections: Not on file  Intimate Partner Violence: Not on file    Outpatient Medications Prior to Visit  Medication Sig Dispense Refill   Accu-Chek FastClix Lancets MISC USE 4 TIMES A DAY 102 each prn   acetaminophen (TYLENOL) 500 MG tablet Take 1,000 mg every 6 (six) hours as needed by mouth for moderate pain or headache.     AMBULATORY NON FORMULARY MEDICATION Medication Name: CPAP  set to 7 cm of water. Please fit for new facemask with tubing. Humidifier. Diagnosis moderate obstructive sleep apnea. Please see sleep study from 01/27/2005. 1 vial 0   amLODipine (NORVASC) 10 MG tablet Take 1 tablet (10 mg total) by mouth daily. 90 tablet 1   blood glucose meter kit and supplies Dispense based on patient and insurance preference. Use up to four times daily as  directed. Dx:E11.65. 1 each 0   Choline Fenofibrate (FENOFIBRIC ACID) 135 MG CPDR Take 1 capsule by mouth daily. 90 capsule 3   fexofenadine (ALLEGRA) 180 MG tablet Take 180 mg by mouth daily.     furosemide (LASIX) 20 MG tablet Take 1 tablet (20 mg total) by mouth daily as needed for edema. 30 tablet 1   glucose blood (ACCU-CHEK GUIDE) test strip To be used for testing up to 4 times daily. Dx:E11.65 400 each 3   Ketotifen Fumarate (ALAWAY OP) Place 1 drop 2 (two) times daily into both eyes.     metFORMIN (GLUCOPHAGE) 500 MG tablet Take 1 tablet (500 mg total) by mouth daily with breakfast. 90 tablet 1   Multiple Vitamin (MULTIVITAMIN) capsule Take 1 capsule by mouth daily.     omega-3 acid ethyl esters (LOVAZA) 1 g capsule TAKE 1 CAPSULE (1 G TOTAL) BY MOUTH 2 (TWO) TIMES DAILY. 180 capsule 3    valsartan-hydrochlorothiazide (DIOVAN-HCT) 320-25 MG tablet Take 1 tablet by mouth daily. 90 tablet 1   venlafaxine XR (EFFEXOR-XR) 75 MG 24 hr capsule TAKE 1 CAPSULE BY MOUTH EVERY DAY 90 capsule 1   progesterone (PROMETRIUM) 200 MG capsule TAKE 1 CAPSULE BY MOUTH DAILY FOR 2 WEEKS EVERY OTHER MONTH 42 capsule 2   No facility-administered medications prior to visit.    No Known Allergies  ROS Review of Systems    Objective:    Physical Exam Constitutional:      Appearance: Normal appearance. She is well-developed.  HENT:     Head: Normocephalic and atraumatic.  Cardiovascular:     Rate and Rhythm: Normal rate and regular rhythm.     Heart sounds: Normal heart sounds.  Pulmonary:     Effort: Pulmonary effort is normal.     Breath sounds: Normal breath sounds.  Skin:    General: Skin is warm and dry.  Neurological:     Mental Status: She is alert and oriented to person, place, and time.  Psychiatric:        Behavior: Behavior normal.    BP 136/67    Pulse 83    Resp 18    Ht 5' (1.524 m)    Wt 209 lb (94.8 kg)    LMP 12/21/2012    SpO2 98%    BMI 40.82 kg/m  Wt Readings from Last 3 Encounters:  11/13/21 209 lb (94.8 kg)  06/22/21 207 lb (93.9 kg)  12/25/20 208 lb (94.3 kg)     Health Maintenance Due  Topic Date Due   OPHTHALMOLOGY EXAM  12/28/2019   COVID-19 Vaccine (4 - Booster for Moderna series) 12/11/2020    There are no preventive care reminders to display for this patient.  Lab Results  Component Value Date   TSH 4.31 06/18/2019   Lab Results  Component Value Date   WBC 4.2 06/22/2021   HGB 13.4 06/22/2021   HCT 39.9 06/22/2021   MCV 86.7 06/22/2021   PLT 266 06/22/2021   Lab Results  Component Value Date   NA 140 06/22/2021   K 3.8 06/22/2021   CO2 29 06/22/2021   GLUCOSE 134 (H) 06/22/2021   BUN 16 06/22/2021   CREATININE 0.84 06/22/2021   BILITOT 0.3 06/22/2021   ALKPHOS 33 01/20/2017   AST 32 06/22/2021   ALT 38 (H) 06/22/2021    PROT 6.8 06/22/2021   ALBUMIN 5.1 01/20/2017   CALCIUM 9.8 06/22/2021   ANIONGAP 8 08/15/2017   EGFR 80 06/22/2021  Lab Results  Component Value Date   CHOL 165 06/22/2021   Lab Results  Component Value Date   HDL 48 (L) 06/22/2021   Lab Results  Component Value Date   LDLCALC 96 06/22/2021   Lab Results  Component Value Date   TRIG 116 06/22/2021   Lab Results  Component Value Date   CHOLHDL 3.4 06/22/2021   Lab Results  Component Value Date   HGBA1C 6.2 (A) 06/22/2021      Assessment & Plan:   Problem List Items Addressed This Visit       Cardiovascular and Mediastinum   HYPERTENSION, BENIGN - Primary    Pete blood pressure also in the 130s so not quite ideal.  We will continue to monitor but ideally we would like the systolic less than 154.  Continue Diovan HCTZ and amlodipine.  We could consider combining these into Tribenzor if her insurance would cover it.  It looks like it is considered a nonpreferred medication which would likely result in a higher co-pay.  But we will still keep that in mind going forward.        Endocrine   Controlled type 2 diabetes mellitus without complication, without long-term current use of insulin ([place])    She is concerned that her A1c may be a little bit more elevated than the last time she was here.  Unfortunately we were unable to do the A1c today because we are out of cartridges.  She will try to come back in the next week or 2 and then we can make an adjustment to her regimen if needed.  But she would really like to just have some time to focus back on lifestyle changes and not make any medication changes if possible.      Relevant Orders   HgB A1c   Other Visit Diagnoses     Encounter for screening mammogram for malignant neoplasm of breast       Screening mammogram for breast cancer       Relevant Orders   MM 3D SCREEN BREAST BILATERAL       Mammogram order placed today.  No orders of the defined types were  placed in this encounter.   Follow-up: No follow-ups on file.    Beatrice Lecher, MD

## 2021-11-19 ENCOUNTER — Ambulatory Visit (INDEPENDENT_AMBULATORY_CARE_PROVIDER_SITE_OTHER): Payer: BC Managed Care – PPO | Admitting: Family Medicine

## 2021-11-19 ENCOUNTER — Other Ambulatory Visit: Payer: Self-pay | Admitting: Family Medicine

## 2021-11-19 ENCOUNTER — Encounter: Payer: Self-pay | Admitting: Family Medicine

## 2021-11-19 ENCOUNTER — Other Ambulatory Visit: Payer: Self-pay

## 2021-11-19 DIAGNOSIS — E119 Type 2 diabetes mellitus without complications: Secondary | ICD-10-CM | POA: Diagnosis not present

## 2021-11-19 LAB — POCT GLYCOSYLATED HEMOGLOBIN (HGB A1C): Hemoglobin A1C: 6.3 % — AB (ref 4.0–5.6)

## 2021-11-19 NOTE — Progress Notes (Signed)
Patient in for for A1c fingerstick.  ?

## 2021-11-19 NOTE — Progress Notes (Signed)
Hi Loretta Bullock, thank you for dropping by today.  It was 6.3 this time.  It was 6.2 last time.  We can continue to work on healthy diet and regular exercise or we could consider increasing the metformin to twice a day.  Just let me know if you have a preference.  And then lets plan to recheck it again in about 4 months.

## 2021-12-05 ENCOUNTER — Other Ambulatory Visit: Payer: Self-pay | Admitting: Family Medicine

## 2021-12-05 DIAGNOSIS — F39 Unspecified mood [affective] disorder: Secondary | ICD-10-CM

## 2021-12-05 DIAGNOSIS — I10 Essential (primary) hypertension: Secondary | ICD-10-CM

## 2021-12-05 DIAGNOSIS — E119 Type 2 diabetes mellitus without complications: Secondary | ICD-10-CM

## 2021-12-16 ENCOUNTER — Encounter: Payer: Self-pay | Admitting: Family Medicine

## 2021-12-16 DIAGNOSIS — Z1231 Encounter for screening mammogram for malignant neoplasm of breast: Secondary | ICD-10-CM

## 2022-01-06 ENCOUNTER — Encounter: Payer: Self-pay | Admitting: Family Medicine

## 2022-01-11 ENCOUNTER — Encounter: Payer: Self-pay | Admitting: Family Medicine

## 2022-01-11 ENCOUNTER — Ambulatory Visit (INDEPENDENT_AMBULATORY_CARE_PROVIDER_SITE_OTHER): Payer: BC Managed Care – PPO | Admitting: Family Medicine

## 2022-01-11 VITALS — BP 131/61 | HR 92 | Ht 60.0 in | Wt 209.0 lb

## 2022-01-11 DIAGNOSIS — I10 Essential (primary) hypertension: Secondary | ICD-10-CM

## 2022-01-11 DIAGNOSIS — F439 Reaction to severe stress, unspecified: Secondary | ICD-10-CM | POA: Diagnosis not present

## 2022-01-11 DIAGNOSIS — E119 Type 2 diabetes mellitus without complications: Secondary | ICD-10-CM

## 2022-01-11 MED ORDER — TRULICITY 0.75 MG/0.5ML ~~LOC~~ SOAJ
0.7500 mg | SUBCUTANEOUS | 1 refills | Status: DC
Start: 2022-01-11 — End: 2022-02-22

## 2022-01-11 MED ORDER — METOPROLOL SUCCINATE ER 25 MG PO TB24: 25.0000 mg | ORAL_TABLET | Freq: Every day | ORAL | 2 refills | Status: DC

## 2022-01-11 MED ORDER — FUROSEMIDE 20 MG PO TABS
20.0000 mg | ORAL_TABLET | Freq: Every day | ORAL | 1 refills | Status: DC | PRN
Start: 2022-01-11 — End: 2022-02-04

## 2022-01-11 NOTE — Progress Notes (Signed)
? ?Established Patient Office Visit ? ?Subjective   ?Patient ID: Loretta Bullock, female    DOB: [birth year]  Age: 61 y.o. MRN: [id number] ? ?Chief Complaint  ?Patient presents with  ? Hypertension  ? ? ?HPI ? ?She reports that home blood pressures have been elevated recently.  She has been seeing blood pressures often in the 140s to 160s with her home cuff which she did bring in with her today.  She says the highest blood pressure she saw was 190/70.  She says she finally called to make an appointment because last week she had a headache almost most of the week and can tell it was because her blood pressure was high she has not noticed any major changes in her regimen her stress has been up a little bit.  There is a bit of tension between her son and her husband.  Is that she has not been sleeping the best.  And her mom is on hospice and they really only given her a couple of months to live. ? ? ? ?ROS ? ?  ?Objective:  ?  ? ?BP 131/61   Pulse 92   Ht 5' (1.524 m)   Wt 209 lb (94.8 kg)   LMP 12/21/2012   SpO2 94%   BMI 40.82 kg/m?  ? ? ?Physical Exam ?Vitals and nursing note reviewed.  ?Constitutional:   ?   Appearance: She is well-developed.  ?HENT:  ?   Head: Normocephalic and atraumatic.  ?Cardiovascular:  ?   Rate and Rhythm: Normal rate and regular rhythm.  ?   Heart sounds: Normal heart sounds.  ?Pulmonary:  ?   Effort: Pulmonary effort is normal.  ?   Breath sounds: Normal breath sounds.  ?Skin: ?   General: Skin is warm and dry.  ?Neurological:  ?   Mental Status: She is alert and oriented to person, place, and time.  ?Psychiatric:     ?   Behavior: Behavior normal.  ? ? ? ?No results found for any visits on 01/11/22. ? ? ? ?The 10-year ASCVD risk score (Arnett DK, et al., 2019) is: 8.3% ? ?  ?Assessment & Plan:  ? ?Problem List Items Addressed This Visit   ? ?  ? Cardiovascular and Mediastinum  ? HYPERTENSION, BENIGN - Primary  ?  Pressures have been a little bit elevated at home particular in the 1 40-1  60 range.  We discussed continuing to work on healthy food choices, low salt.  Reducing stress and try to get adequate sleep.  We also discussed maybe adding a another blood pressure medication that hopefully would be temporary to reduce her blood pressure just mildly so.  I do not want to over lower the blood pressure and then she is having dizziness or lightheadedness.  We discussed adding a low-dose beta-blocker, metoprolol he already has a follow-up in about 2 months so I will see her back then and we can keep track of it over time I do think her home cuff reads a little bit higher than ours.  We had a blood pressure of 131/61 today.  We did discuss that optimal would be less than 130. ? ?  ?  ? Relevant Medications  ? furosemide (LASIX) 20 MG tablet  ? metoprolol succinate (TOPROL-XL) 25 MG 24 hr tablet  ?  ? Endocrine  ? Controlled type 2 diabetes mellitus without complication, without long-term current use of insulin ([place])  ?  We also discussed making some changes to  her diabetes regimen her last A1c looked great at 6.3 just on the metformin but I do think one of the GLP-1 medications could be really helpful for her and helping to curb appetite and control her blood sugars in addition to helping to reduce her weight.  BMI is currently 40 which would then in turn also help with her blood pressure we discussed the medication and potential side effects.  It looks like Trulicity is covered her on her insurance plan, where is Ozempic of Mounjaro or not.  New prescription sent to pharmacy again and like to see her back in about 8 weeks to see how she is doing.  She can discontinue the metformin when she starts the Trulicity. ? ?  ?  ? Relevant Medications  ? Dulaglutide (TRULICITY) 9.37 DS/2.8JG SOPN  ? ?Other Visit Diagnoses   ? ? Stress      ? ?  ? ? ?Stress levels are increased recently just encouraged her to work on finding ways to lower her stress and stay active. ? ?No follow-ups on file.  ? ? ?Beatrice Lecher, MD ? ?

## 2022-01-11 NOTE — Progress Notes (Signed)
Home bp  ?143/79 p:89 ? ?127/72 p:91 ?

## 2022-01-11 NOTE — Assessment & Plan Note (Signed)
Pressures have been a little bit elevated at home particular in the 1 40-1 60 range.  We discussed continuing to work on healthy food choices, low salt.  Reducing stress and try to get adequate sleep.  We also discussed maybe adding a another blood pressure medication that hopefully would be temporary to reduce her blood pressure just mildly so.  I do not want to over lower the blood pressure and then she is having dizziness or lightheadedness.  We discussed adding a low-dose beta-blocker, metoprolol he already has a follow-up in about 2 months so I will see her back then and we can keep track of it over time I do think her home cuff reads a little bit higher than ours.  We had a blood pressure of 131/61 today.  We did discuss that optimal would be less than 130. ?

## 2022-01-11 NOTE — Assessment & Plan Note (Signed)
We also discussed making some changes to her diabetes regimen her last A1c looked great at 6.3 just on the metformin but I do think one of the GLP-1 medications could be really helpful for her and helping to curb appetite and control her blood sugars in addition to helping to reduce her weight.  BMI is currently 40 which would then in turn also help with her blood pressure we discussed the medication and potential side effects.  It looks like Trulicity is covered her on her insurance plan, where is Ozempic of Mounjaro or not.  New prescription sent to pharmacy again and like to see her back in about 8 weeks to see how she is doing.  She can discontinue the metformin when she starts the Trulicity. ?

## 2022-01-21 ENCOUNTER — Ambulatory Visit (INDEPENDENT_AMBULATORY_CARE_PROVIDER_SITE_OTHER): Payer: BC Managed Care – PPO

## 2022-01-21 DIAGNOSIS — Z1231 Encounter for screening mammogram for malignant neoplasm of breast: Secondary | ICD-10-CM | POA: Diagnosis not present

## 2022-01-22 NOTE — Progress Notes (Signed)
Please call patient. Normal mammogram.  Repeat in 1 year.  

## 2022-02-01 ENCOUNTER — Encounter: Payer: Self-pay | Admitting: Family Medicine

## 2022-02-01 MED ORDER — FLUCONAZOLE 150 MG PO TABS: 150.0000 mg | ORAL_TABLET | Freq: Once | ORAL | 0 refills | Status: AC

## 2022-02-01 NOTE — Telephone Encounter (Signed)
Meds ordered this encounter  Medications   fluconazole (DIFLUCAN) 150 MG tablet    Sig: Take 1 tablet (150 mg total) by mouth once for 1 dose.    Dispense:  1 tablet    Refill:  0    

## 2022-02-04 ENCOUNTER — Other Ambulatory Visit: Payer: Self-pay | Admitting: Family Medicine

## 2022-02-22 ENCOUNTER — Ambulatory Visit (INDEPENDENT_AMBULATORY_CARE_PROVIDER_SITE_OTHER): Payer: BC Managed Care – PPO | Admitting: Family Medicine

## 2022-02-22 ENCOUNTER — Encounter: Payer: Self-pay | Admitting: Family Medicine

## 2022-02-22 VITALS — BP 130/70 | HR 72 | Ht 60.0 in | Wt 209.0 lb

## 2022-02-22 DIAGNOSIS — E119 Type 2 diabetes mellitus without complications: Secondary | ICD-10-CM | POA: Diagnosis not present

## 2022-02-22 DIAGNOSIS — Z6835 Body mass index (BMI) 35.0-35.9, adult: Secondary | ICD-10-CM

## 2022-02-22 DIAGNOSIS — F39 Unspecified mood [affective] disorder: Secondary | ICD-10-CM

## 2022-02-22 DIAGNOSIS — I10 Essential (primary) hypertension: Secondary | ICD-10-CM

## 2022-02-22 LAB — POCT GLYCOSYLATED HEMOGLOBIN (HGB A1C): Hemoglobin A1C: 6.4 % — AB (ref 4.0–5.6)

## 2022-02-22 LAB — HM DIABETES EYE EXAM

## 2022-02-22 MED ORDER — TRULICITY 1.5 MG/0.5ML ~~LOC~~ SOAJ: 1.5000 mg | SUBCUTANEOUS | 0 refills | Status: DC

## 2022-02-22 MED ORDER — ATENOLOL 25 MG PO TABS: 25.0000 mg | ORAL_TABLET | Freq: Every day | ORAL | 0 refills | Status: DC

## 2022-02-22 NOTE — Assessment & Plan Note (Signed)
Continue to work on diet and exercise.  Also now on trulicity.

## 2022-02-22 NOTE — Progress Notes (Signed)
Established Patient Office Visit  Subjective   Patient ID: Loretta Bullock, female    DOB: 1960/10/07  Age: 61 y.o. MRN: [id number]  Chief Complaint  Patient presents with   Diabetes   Hypertension    HPI  Hypertension- Pt denies chest pain, SOB, dizziness, or heart palpitations.  Taking meds as directed w/o problems.  Denies medication side effects.  Added metoprolol about 6 weeks ago.  BB making her sleeping during the day even though she takes it at bedtime.   Diabetes - no hypoglycemic events. No wounds or sores that are not healing well. No increased thirst or urination. Checking glucose at home. Taking medications as prescribed without any side effects. Started trulicity about 6 weeks ago as well. On 0.'75mg'$  dose.  Hope to control sugars and improve BMI.    F/U Mood - currently on Effexor. She is doing well overall. Her mom is now on hospice so that has been difficult. Says her mom has had  areally positive attitude.      ROS    Objective:     BP 130/70   Pulse 72   Ht 5' (1.524 m)   Wt 209 lb (94.8 kg)   LMP 12/21/2012   SpO2 93%   BMI 40.82 kg/m    Physical Exam Vitals and nursing note reviewed.  Constitutional:      Appearance: She is well-developed.  HENT:     Head: Normocephalic and atraumatic.  Cardiovascular:     Rate and Rhythm: Normal rate and regular rhythm.     Heart sounds: Normal heart sounds.  Pulmonary:     Effort: Pulmonary effort is normal.     Breath sounds: Normal breath sounds.  Skin:    General: Skin is warm and dry.  Neurological:     Mental Status: She is alert and oriented to person, place, and time.  Psychiatric:        Behavior: Behavior normal.     Results for orders placed or performed in visit on 02/22/22  POCT glycosylated hemoglobin (Hb A1C)  Result Value Ref Range   Hemoglobin A1C 6.4 (A) 4.0 - 5.6 %   HbA1c POC (<> result, manual entry)     HbA1c, POC (prediabetic range)     HbA1c, POC (controlled diabetic range)         The 10-year ASCVD risk score (Arnett DK, et al., 2019) is: 8.2%    Assessment & Plan:   Problem List Items Addressed This Visit       Cardiovascular and Mediastinum   HYPERTENSION, BENIGN    BP better.  Feeling too sleepy on metoprolol so will change to atenolol.       Relevant Medications   atenolol (TENORMIN) 25 MG tablet   Other Relevant Orders   BASIC METABOLIC PANEL WITH GFR   Urine Microalbumin w/creat. ratio     Endocrine   Controlled type 2 diabetes mellitus without complication, without long-term current use of insulin (HCC) - Primary    A1C is good at 6.4. she came off her metformin. Will inc Trulicity to 1.'5mg'$  continue to work on low carb diet and exercise. F/U in 3 months.        Relevant Medications   Dulaglutide (TRULICITY) 1.5 OZ/3.6UY SOPN   Other Relevant Orders   POCT glycosylated hemoglobin (Hb A1C) (Completed)   BASIC METABOLIC PANEL WITH GFR   Urine Microalbumin w/creat. ratio     Other   Severe obesity (BMI 35.0-35.9 with comorbidity) ([place])  Continue to work on diet and exercise.  Also now on trulicity.        Relevant Medications   Dulaglutide (TRULICITY) 1.5 XN/1.7GY SOPN   Episodic mood disorder (HCC)    STable on current medication. PHQ 9 looks great!         Return in about 3 months (around 05/25/2022) for Diabetes follow-up.    Beatrice Lecher, MD

## 2022-02-22 NOTE — Assessment & Plan Note (Signed)
A1C is good at 6.4. she came off her metformin. Will inc Trulicity to 1.'5mg'$  continue to work on low carb diet and exercise. F/U in 3 months.

## 2022-02-22 NOTE — Assessment & Plan Note (Signed)
BP better.  Feeling too sleepy on metoprolol so will change to atenolol.

## 2022-02-22 NOTE — Assessment & Plan Note (Signed)
STable on current medication. PHQ 9 looks great!

## 2022-02-23 LAB — BASIC METABOLIC PANEL WITH GFR
BUN: 18 mg/dL (ref 7–25)
CO2: 29 mmol/L (ref 20–32)
Calcium: 10.4 mg/dL (ref 8.6–10.4)
Chloride: 103 mmol/L (ref 98–110)
Creat: 0.98 mg/dL (ref 0.50–1.05)
Glucose, Bld: 117 mg/dL — ABNORMAL HIGH (ref 65–99)
Potassium: 3.8 mmol/L (ref 3.5–5.3)
Sodium: 142 mmol/L (ref 135–146)
eGFR: 66 mL/min/{1.73_m2} (ref >=60)

## 2022-02-23 LAB — MICROALBUMIN / CREATININE URINE RATIO
Creatinine, Urine: 60 mg/dL (ref 20–275)
Microalb Creat Ratio: 12 mcg/mg creat (ref <30)
Microalb, Ur: 0.7 mg/dL

## 2022-02-23 NOTE — Progress Notes (Signed)
Hi Loretta Bullock, metabolic panel looks good.  No significant protein levels in the urine which is reassuring.

## 2022-03-02 ENCOUNTER — Other Ambulatory Visit: Payer: Self-pay | Admitting: Family Medicine

## 2022-03-06 ENCOUNTER — Other Ambulatory Visit: Payer: Self-pay | Admitting: Family Medicine

## 2022-03-06 DIAGNOSIS — E119 Type 2 diabetes mellitus without complications: Secondary | ICD-10-CM

## 2022-03-18 ENCOUNTER — Other Ambulatory Visit: Payer: Self-pay | Admitting: Family Medicine

## 2022-04-09 ENCOUNTER — Other Ambulatory Visit: Payer: Self-pay | Admitting: Family Medicine

## 2022-04-09 DIAGNOSIS — I10 Essential (primary) hypertension: Secondary | ICD-10-CM

## 2022-05-09 ENCOUNTER — Other Ambulatory Visit: Payer: Self-pay | Admitting: Family Medicine

## 2022-05-09 DIAGNOSIS — I10 Essential (primary) hypertension: Secondary | ICD-10-CM

## 2022-05-13 ENCOUNTER — Other Ambulatory Visit: Payer: Self-pay | Admitting: Family Medicine

## 2022-05-13 DIAGNOSIS — F39 Unspecified mood [affective] disorder: Secondary | ICD-10-CM

## 2022-05-13 DIAGNOSIS — E119 Type 2 diabetes mellitus without complications: Secondary | ICD-10-CM

## 2022-05-31 ENCOUNTER — Ambulatory Visit (INDEPENDENT_AMBULATORY_CARE_PROVIDER_SITE_OTHER): Payer: BC Managed Care – PPO | Admitting: Family Medicine

## 2022-05-31 ENCOUNTER — Encounter: Payer: Self-pay | Admitting: Family Medicine

## 2022-05-31 VITALS — BP 123/65 | HR 76 | Ht 60.0 in | Wt 207.0 lb

## 2022-05-31 DIAGNOSIS — Z23 Encounter for immunization: Secondary | ICD-10-CM

## 2022-05-31 DIAGNOSIS — E119 Type 2 diabetes mellitus without complications: Secondary | ICD-10-CM

## 2022-05-31 DIAGNOSIS — I10 Essential (primary) hypertension: Secondary | ICD-10-CM | POA: Diagnosis not present

## 2022-05-31 DIAGNOSIS — E785 Hyperlipidemia, unspecified: Secondary | ICD-10-CM

## 2022-05-31 DIAGNOSIS — F4321 Adjustment disorder with depressed mood: Secondary | ICD-10-CM

## 2022-05-31 LAB — POCT GLYCOSYLATED HEMOGLOBIN (HGB A1C): Hemoglobin A1C: 5.4 % (ref 4.0–5.6)

## 2022-05-31 MED ORDER — TRULICITY 3 MG/0.5ML ~~LOC~~ SOAJ: 3.0000 mg | SUBCUTANEOUS | 1 refills | Status: DC

## 2022-05-31 NOTE — Assessment & Plan Note (Signed)
A1C looks absolutely phenomenal at 5.4.  Continue current regimen. Will Trulicity to '3mg'$ .

## 2022-05-31 NOTE — Assessment & Plan Note (Signed)
Well controlled. Continue current regimen. Follow up in  4 mop

## 2022-05-31 NOTE — Progress Notes (Signed)
Established Patient Office Visit  Subjective   Patient ID: Loretta Bullock, female    DOB: 06-Jul-1961  Age: 61 y.o. MRN: [id number]  Chief Complaint  Patient presents with   Diabetes    HPI  Diabetes - no hypoglycemic events. No wounds or sores that are not healing well. No increased thirst or urination. Checking glucose at home. Taking medications as prescribed without any side effects.  Report eye exam is up-to-date done at [place] we will have to call and get a copy of that.  Hypertension- Pt denies chest pain, SOB, dizziness, or heart palpitations.  Taking meds as directed w/o problems.  Denies medication side effects.    Her mom passed away earlier this summer.  She has some good support with her sister.  She feels like she is grieving normally.     ROS    Objective:     BP 123/65   Pulse 76   Ht 5' (1.524 m)   Wt 207 lb (93.9 kg)   LMP 12/04/2012   SpO2 96%   BMI 40.43 kg/m    Physical Exam Vitals and nursing note reviewed.  Constitutional:      Appearance: She is well-developed.  HENT:     Head: Normocephalic and atraumatic.  Cardiovascular:     Rate and Rhythm: Normal rate and regular rhythm.     Heart sounds: Normal heart sounds.  Pulmonary:     Effort: Pulmonary effort is normal.     Breath sounds: Normal breath sounds.  Skin:    General: Skin is warm and dry.  Neurological:     Mental Status: She is alert and oriented to person, place, and time.  Psychiatric:        Behavior: Behavior normal.      Results for orders placed or performed in visit on 05/31/22  POCT glycosylated hemoglobin (Hb A1C)  Result Value Ref Range   Hemoglobin A1C 5.4 4.0 - 5.6 %   HbA1c POC (<> result, manual entry)     HbA1c, POC (prediabetic range)     HbA1c, POC (controlled diabetic range)        The 10-year ASCVD risk score (Arnett DK, et al., 2019) is: 8.2%    Assessment & Plan:   Problem List Items Addressed This Visit       Cardiovascular and  Mediastinum   HYPERTENSION, BENIGN    Well controlled. Continue current regimen. Follow up in  4 mop       Relevant Orders   Lipid Panel w/reflex Direct LDL   COMPLETE METABOLIC PANEL WITH GFR   CBC     Endocrine   Controlled type 2 diabetes mellitus without complication, without long-term current use of insulin (HCC) - Primary    A1C looks absolutely phenomenal at 5.4.  Continue current regimen. Will Trulicity to '3mg'$ .        Relevant Medications   Dulaglutide (TRULICITY) 3 XH/3.7JI SOPN   Other Relevant Orders   POCT glycosylated hemoglobin (Hb A1C) (Completed)   Lipid Panel w/reflex Direct LDL   COMPLETE METABOLIC PANEL WITH GFR   CBC     Other   Hyperlipidemia   Relevant Orders   Lipid Panel w/reflex Direct LDL   COMPLETE METABOLIC PANEL WITH GFR   CBC   Other Visit Diagnoses     Need for immunization against influenza       Relevant Orders   Flu Vaccine QUAD 103moIM (Fluarix, Fluzone & Alfiuria Quad PF) (Completed)   Grief  Grief-overall doing well.  I did encourage her to reach out if at any point we can be helpful.  It sounds like she has some good support.  Return in about 4 months (around 09/30/2022) for Diabetes follow-up.    Beatrice Lecher, MD

## 2022-07-24 LAB — COMPLETE METABOLIC PANEL WITH GFR
AG Ratio: 2 (calc) (ref 1.0–2.5)
ALT: 31 U/L — ABNORMAL HIGH (ref 6–29)
AST: 24 U/L (ref 10–35)
Albumin: 4.5 g/dL (ref 3.6–5.1)
Alkaline phosphatase (APISO): 30 U/L — ABNORMAL LOW (ref 37–153)
BUN: 15 mg/dL (ref 7–25)
CO2: 28 mmol/L (ref 20–32)
Calcium: 9.6 mg/dL (ref 8.6–10.4)
Chloride: 104 mmol/L (ref 98–110)
Creat: 0.89 mg/dL (ref 0.50–1.05)
Globulin: 2.2 g/dL (calc) (ref 1.9–3.7)
Glucose, Bld: 108 mg/dL — ABNORMAL HIGH (ref 65–99)
Potassium: 4.1 mmol/L (ref 3.5–5.3)
Sodium: 140 mmol/L (ref 135–146)
Total Bilirubin: 0.5 mg/dL (ref 0.2–1.2)
Total Protein: 6.7 g/dL (ref 6.1–8.1)
eGFR: 74 mL/min/{1.73_m2} (ref >=60)

## 2022-07-24 LAB — CBC
HCT: 38.2 % (ref 35.0–45.0)
Hemoglobin: 13.2 g/dL (ref 11.7–15.5)
MCH: 29.9 pg (ref 27.0–33.0)
MCHC: 34.6 g/dL (ref 32.0–36.0)
MCV: 86.6 fL (ref 80.0–100.0)
MPV: 10 fL (ref 7.5–12.5)
Platelets: 254 10*3/uL (ref 140–400)
RBC: 4.41 10*6/uL (ref 3.80–5.10)
RDW: 12.7 % (ref 11.0–15.0)
WBC: 4.5 10*3/uL (ref 3.8–10.8)

## 2022-07-24 LAB — LIPID PANEL W/REFLEX DIRECT LDL
Cholesterol: 155 mg/dL (ref <200)
HDL: 43 mg/dL — ABNORMAL LOW (ref >=50)
LDL Cholesterol (Calc): 87 mg/dL (calc)
Non-HDL Cholesterol (Calc): 112 mg/dL (calc) (ref <130)
Total CHOL/HDL Ratio: 3.6 (calc) (ref <5.0)
Triglycerides: 158 mg/dL — ABNORMAL HIGH (ref <150)

## 2022-07-26 NOTE — Progress Notes (Signed)
Hi Loretta Bullock, LDL looks great.  Triglycerides are just a little bit so continue work on healthy diet and regular exercise.  Metabolic panel overall looks okay except your ALT liver enzymes just slightly elevated but it is better than last time.  Blood count is normal.

## 2022-08-02 ENCOUNTER — Other Ambulatory Visit: Payer: Self-pay | Admitting: Family Medicine

## 2022-08-02 DIAGNOSIS — I10 Essential (primary) hypertension: Secondary | ICD-10-CM

## 2022-08-08 ENCOUNTER — Other Ambulatory Visit: Payer: Self-pay | Admitting: Family Medicine

## 2022-08-09 ENCOUNTER — Other Ambulatory Visit: Payer: Self-pay | Admitting: Family Medicine

## 2022-10-04 ENCOUNTER — Ambulatory Visit (INDEPENDENT_AMBULATORY_CARE_PROVIDER_SITE_OTHER): Payer: BC Managed Care – PPO | Admitting: Family Medicine

## 2022-10-04 ENCOUNTER — Encounter: Payer: Self-pay | Admitting: Family Medicine

## 2022-10-04 VITALS — BP 135/67 | HR 79 | Ht 60.0 in | Wt 204.0 lb

## 2022-10-04 DIAGNOSIS — I10 Essential (primary) hypertension: Secondary | ICD-10-CM

## 2022-10-04 DIAGNOSIS — E119 Type 2 diabetes mellitus without complications: Secondary | ICD-10-CM | POA: Diagnosis not present

## 2022-10-04 DIAGNOSIS — F39 Unspecified mood [affective] disorder: Secondary | ICD-10-CM | POA: Diagnosis not present

## 2022-10-04 LAB — POCT GLYCOSYLATED HEMOGLOBIN (HGB A1C): Hemoglobin A1C: 5.9 % — AB (ref 4.0–5.6)

## 2022-10-04 NOTE — Assessment & Plan Note (Addendum)
A1c of 5.9 today.  She is down about 3 more pounds which is fantastic she is really been working on it and trying to make changes.  Continue Trulicity 3 mg.  We did discuss the possibility of going up to 4.5.  She says she will think about it between now and her next refill which is probably in a couple of months.  Well controlled. Continue current regimen. Follow up in  5 mo .

## 2022-10-04 NOTE — Progress Notes (Signed)
   Established Patient Office Visit  Subjective   Patient ID: Loretta Bullock, female    DOB: 1961-02-17  Age: 62 y.o. MRN: [id number]  Chief Complaint  Patient presents with   Diabetes    HPI   Diabetes - no hypoglycemic events. No wounds or sores that are not healing well. No increased thirst or urination. Checking glucose at home. Taking medications as prescribed without any side effects.  Hypertension- Pt denies chest pain, SOB, dizziness, or heart palpitations.  Taking meds as directed w/o problems.  Denies medication side effects.    Been under some increased stress recently.  She just found out that her brother's partner had been slowly trying to poison him and also had stolen some money.     ROS    Objective:     BP 135/67   Pulse 79   Ht 5' (1.524 m)   Wt 204 lb (92.5 kg)   LMP 12/04/2012   SpO2 95%   BMI 39.84 kg/m    Physical Exam Vitals and nursing note reviewed.  Constitutional:      Appearance: She is well-developed.  HENT:     Head: Normocephalic and atraumatic.  Cardiovascular:     Rate and Rhythm: Normal rate and regular rhythm.     Heart sounds: Normal heart sounds.  Pulmonary:     Effort: Pulmonary effort is normal.     Breath sounds: Normal breath sounds.  Skin:    General: Skin is warm and dry.  Neurological:     Mental Status: She is alert and oriented to person, place, and time.  Psychiatric:        Behavior: Behavior normal.      Results for orders placed or performed in visit on 10/04/22  POCT glycosylated hemoglobin (Hb A1C)  Result Value Ref Range   Hemoglobin A1C 5.9 (A) 4.0 - 5.6 %   HbA1c POC (<> result, manual entry)     HbA1c, POC (prediabetic range)     HbA1c, POC (controlled diabetic range)        The 10-year ASCVD risk score (Arnett DK, et al., 2019) is: 10%    Assessment & Plan:   Problem List Items Addressed This Visit       Cardiovascular and Mediastinum   HYPERTENSION, BENIGN    Well controlled.  Continue current regimen. Follow up in  6 mon        Endocrine   Controlled type 2 diabetes mellitus without complication, without long-term current use of insulin (HCC) - Primary    A1c of 5.9 today.  She is down about 3 more pounds which is fantastic she is really been working on it and trying to make changes.  Continue Trulicity 3 mg.  We did discuss the possibility of going up to 4.5.  She says she will think about it between now and her next refill which is probably in a couple of months.  Well controlled. Continue current regimen. Follow up in  5 mo .        Relevant Orders   POCT glycosylated hemoglobin (Hb A1C) (Completed)     Other   Episodic mood disorder ([place])    Even with increased stress feeling like she is doing well overall.  Continue venlafaxine.       Return in about 4 months (around 02/02/2023).    Beatrice Lecher, MD

## 2022-10-04 NOTE — Assessment & Plan Note (Signed)
Well controlled. Continue current regimen. Follow up in  6 mon

## 2022-10-04 NOTE — Assessment & Plan Note (Addendum)
Even with increased stress feeling like she is doing well overall.  Continue venlafaxine.

## 2022-11-07 ENCOUNTER — Other Ambulatory Visit: Payer: Self-pay | Admitting: Family Medicine

## 2022-11-07 DIAGNOSIS — I10 Essential (primary) hypertension: Secondary | ICD-10-CM

## 2022-11-08 ENCOUNTER — Other Ambulatory Visit: Payer: Self-pay | Admitting: Family Medicine

## 2022-11-09 ENCOUNTER — Other Ambulatory Visit: Payer: Self-pay | Admitting: Family Medicine

## 2022-11-09 DIAGNOSIS — F39 Unspecified mood [affective] disorder: Secondary | ICD-10-CM

## 2022-11-18 ENCOUNTER — Encounter: Payer: Self-pay | Admitting: Family Medicine

## 2022-11-19 MED ORDER — TRULICITY 4.5 MG/0.5ML ~~LOC~~ SOAJ: 4.5000 mg | SUBCUTANEOUS | 0 refills | Status: DC

## 2023-02-03 ENCOUNTER — Ambulatory Visit: Payer: BC Managed Care – PPO | Admitting: Family Medicine

## 2023-02-03 ENCOUNTER — Other Ambulatory Visit: Payer: Self-pay | Admitting: Family Medicine

## 2023-02-03 DIAGNOSIS — I10 Essential (primary) hypertension: Secondary | ICD-10-CM

## 2023-02-04 ENCOUNTER — Ambulatory Visit (INDEPENDENT_AMBULATORY_CARE_PROVIDER_SITE_OTHER): Payer: BC Managed Care – PPO | Admitting: Family Medicine

## 2023-02-04 ENCOUNTER — Encounter: Payer: Self-pay | Admitting: Family Medicine

## 2023-02-04 VITALS — BP 125/80 | HR 85 | Ht 60.0 in | Wt 203.0 lb

## 2023-02-04 DIAGNOSIS — I10 Essential (primary) hypertension: Secondary | ICD-10-CM

## 2023-02-04 DIAGNOSIS — E119 Type 2 diabetes mellitus without complications: Secondary | ICD-10-CM

## 2023-02-04 LAB — POCT GLYCOSYLATED HEMOGLOBIN (HGB A1C): Hemoglobin A1C: 5.3 % (ref 4.0–5.6)

## 2023-02-04 NOTE — Assessment & Plan Note (Signed)
Pressure looks phenomenal to today.  Due for updated labs.

## 2023-02-04 NOTE — Progress Notes (Signed)
   Established Patient Office Visit  Subjective   Patient ID: Loretta Bullock, female    DOB: 16-Sep-1961  Age: 62 y.o. MRN: [id number]  Chief Complaint  Patient presents with   Diabetes    HPI Hypertension- Pt denies chest pain, SOB, dizziness, or heart palpitations.  Taking meds as directed w/o problems.  Denies medication side effects.    Diabetes - no hypoglycemic events. No wounds or sores that are not healing well. No increased thirst or urination. Checking glucose at home. Taking medications as prescribed without any side effects.   ROS    Objective:     BP 125/80   Pulse 85   Ht 5' (1.524 m)   Wt 203 lb (92.1 kg)   LMP 12/04/2012   SpO2 95%   BMI 39.65 kg/m    Physical Exam Vitals and nursing note reviewed.  Constitutional:      Appearance: She is well-developed.  HENT:     Head: Normocephalic and atraumatic.  Cardiovascular:     Rate and Rhythm: Normal rate and regular rhythm.     Heart sounds: Normal heart sounds.  Pulmonary:     Effort: Pulmonary effort is normal.     Breath sounds: Normal breath sounds.  Skin:    General: Skin is warm and dry.  Neurological:     Mental Status: She is alert and oriented to person, place, and time.  Psychiatric:        Behavior: Behavior normal.      Results for orders placed or performed in visit on 02/04/23  POCT glycosylated hemoglobin (Hb A1C)  Result Value Ref Range   Hemoglobin A1C 5.3 4.0 - 5.6 %   HbA1c POC (<> result, manual entry)     HbA1c, POC (prediabetic range)     HbA1c, POC (controlled diabetic range)        The 10-year ASCVD risk score (Arnett DK, et al., 2019) is: 8.6%    Assessment & Plan:   Problem List Items Addressed This Visit       Cardiovascular and Mediastinum   HYPERTENSION, BENIGN    Pressure looks phenomenal to today.  Due for updated labs.      Relevant Orders   COMPLETE METABOLIC PANEL WITH GFR   Urine Microalbumin w/creat. ratio     Endocrine   Controlled type 2  diabetes mellitus without complication, without long-term current use of insulin (HCC) - Primary    A1c looks great at 5.3 today.  We did discuss getting on track with some of her diet strategy and also starting to incorporate more regular exercise.  We did discuss possibly switching to a different GLP-1 for more effective for weight loss.  For now she is going to stick with what she is doing over the summer and then we will follow-up and we can certainly change medications at any point if she would prefer to.      Relevant Orders   COMPLETE METABOLIC PANEL WITH GFR   Urine Microalbumin w/creat. ratio   POCT glycosylated hemoglobin (Hb A1C) (Completed)    Return in about 4 months (around 06/07/2023) for Diabetes follow-up.    Nani Gasser, MD

## 2023-02-04 NOTE — Assessment & Plan Note (Signed)
A1c looks great at 5.3 today.  We did discuss getting on track with some of her diet strategy and also starting to incorporate more regular exercise.  We did discuss possibly switching to a different GLP-1 for more effective for weight loss.  For now she is going to stick with what she is doing over the summer and then we will follow-up and we can certainly change medications at any point if she would prefer to.

## 2023-02-05 LAB — COMPLETE METABOLIC PANEL WITH GFR
AG Ratio: 2.2 (calc) (ref 1.0–2.5)
ALT: 31 U/L — ABNORMAL HIGH (ref 6–29)
AST: 24 U/L (ref 10–35)
Albumin: 4.6 g/dL (ref 3.6–5.1)
Alkaline phosphatase (APISO): 34 U/L — ABNORMAL LOW (ref 37–153)
BUN: 16 mg/dL (ref 7–25)
CO2: 28 mmol/L (ref 20–32)
Calcium: 10.1 mg/dL (ref 8.6–10.4)
Chloride: 104 mmol/L (ref 98–110)
Creat: 1 mg/dL (ref 0.50–1.05)
Globulin: 2.1 g/dL (calc) (ref 1.9–3.7)
Glucose, Bld: 90 mg/dL (ref 65–99)
Potassium: 3.5 mmol/L (ref 3.5–5.3)
Sodium: 142 mmol/L (ref 135–146)
Total Bilirubin: 0.4 mg/dL (ref 0.2–1.2)
Total Protein: 6.7 g/dL (ref 6.1–8.1)
eGFR: 64 mL/min/{1.73_m2} (ref >=60)

## 2023-02-05 LAB — MICROALBUMIN / CREATININE URINE RATIO
Creatinine, Urine: 68 mg/dL (ref 20–275)
Microalb Creat Ratio: 7 mg/g creat (ref <30)
Microalb, Ur: 0.5 mg/dL

## 2023-02-07 NOTE — Progress Notes (Signed)
HI Tresa Endo, your ALT liver enzymes is stable. Kidney function is stable. No excess protein which is good.

## 2023-02-08 ENCOUNTER — Other Ambulatory Visit: Payer: Self-pay | Admitting: Family Medicine

## 2023-02-08 DIAGNOSIS — E119 Type 2 diabetes mellitus without complications: Secondary | ICD-10-CM

## 2023-02-11 ENCOUNTER — Other Ambulatory Visit: Payer: Self-pay | Admitting: Family Medicine

## 2023-02-11 DIAGNOSIS — Z1231 Encounter for screening mammogram for malignant neoplasm of breast: Secondary | ICD-10-CM

## 2023-02-11 MED ORDER — TRULICITY 4.5 MG/0.5ML ~~LOC~~ SOAJ: 4.5000 mg | SUBCUTANEOUS | 0 refills | Status: DC

## 2023-02-21 ENCOUNTER — Encounter: Payer: Self-pay | Admitting: Family Medicine

## 2023-03-16 ENCOUNTER — Ambulatory Visit (INDEPENDENT_AMBULATORY_CARE_PROVIDER_SITE_OTHER): Payer: BC Managed Care – PPO

## 2023-03-16 DIAGNOSIS — Z1231 Encounter for screening mammogram for malignant neoplasm of breast: Secondary | ICD-10-CM

## 2023-03-18 NOTE — Progress Notes (Signed)
Please call patient. Normal mammogram.  Repeat in 1 year.  

## 2023-04-15 ENCOUNTER — Telehealth: Payer: Self-pay | Admitting: Family Medicine

## 2023-04-15 NOTE — Telephone Encounter (Signed)
Is call patient and see if she is already scheduled her yearly follow-up at my eye doctor?  We have not seen any exam come across the system from them.  If she has not then please encourage her to schedule at her earliest convenience.

## 2023-04-28 ENCOUNTER — Ambulatory Visit (INDEPENDENT_AMBULATORY_CARE_PROVIDER_SITE_OTHER): Payer: BC Managed Care – PPO | Admitting: Sports Medicine

## 2023-04-28 ENCOUNTER — Ambulatory Visit (INDEPENDENT_AMBULATORY_CARE_PROVIDER_SITE_OTHER): Payer: BC Managed Care – PPO

## 2023-04-28 DIAGNOSIS — M5030 Other cervical disc degeneration, unspecified cervical region: Secondary | ICD-10-CM

## 2023-04-28 DIAGNOSIS — M5136 Other intervertebral disc degeneration, lumbar region: Secondary | ICD-10-CM

## 2023-04-28 DIAGNOSIS — M51369 Other intervertebral disc degeneration, lumbar region without mention of lumbar back pain or lower extremity pain: Secondary | ICD-10-CM

## 2023-04-28 MED ORDER — METHOCARBAMOL 500 MG PO TABS
500.0000 mg | ORAL_TABLET | Freq: Three times a day (TID) | ORAL | 0 refills | Status: DC
Start: 2023-04-28 — End: 2023-10-31

## 2023-04-28 MED ORDER — PREDNISONE 50 MG PO TABS
ORAL_TABLET | ORAL | 0 refills | Status: DC
Start: 2023-04-28 — End: 2023-06-13

## 2023-04-28 NOTE — Assessment & Plan Note (Signed)
Doing well post fusion with Dr. Franky Macho.

## 2023-04-28 NOTE — Progress Notes (Signed)
    Procedures performed today:    None.  Independent interpretation of notes and tests performed by another provider:   None.  Brief History, Exam, Impression, and Recommendations:    Lumbar degenerative disc disease Doing well post fusion with Dr. Franky Macho.   DDD (degenerative disc disease), cervical Increasing pain axial neck right-sided with periscapular discomfort, numbness and tingling to the thumb bilaterally. No progressive weakness, exam is for the most part normal. Suspect bilateral C6 radiculopathy, adding x-rays, prednisone, Robaxin, formal PT, return to see me in 6 weeks, MR for interventional planning if not better.  Chronic process with exacerbation and pharmacologic intervention  ____________________________________________ Loretta Bullock. Loretta Bullock, M.D., ABFM., CAQSM., AME. Primary Care and Sports Medicine [facility]  Adjunct Professor of Family Medicine  [employer]  Restaurant manager, [employer] food

## 2023-04-28 NOTE — Assessment & Plan Note (Signed)
Increasing pain axial neck right-sided with periscapular discomfort, numbness and tingling to the thumb bilaterally. No progressive weakness, exam is for the most part normal. Suspect bilateral C6 radiculopathy, adding x-rays, prednisone, Robaxin, formal PT, return to see me in 6 weeks, MR for interventional planning if not better.

## 2023-05-02 ENCOUNTER — Other Ambulatory Visit: Payer: Self-pay | Admitting: Family Medicine

## 2023-05-02 DIAGNOSIS — F39 Unspecified mood [affective] disorder: Secondary | ICD-10-CM

## 2023-05-02 DIAGNOSIS — I10 Essential (primary) hypertension: Secondary | ICD-10-CM

## 2023-05-05 ENCOUNTER — Other Ambulatory Visit: Payer: Self-pay | Admitting: Family Medicine

## 2023-05-05 DIAGNOSIS — I10 Essential (primary) hypertension: Secondary | ICD-10-CM

## 2023-05-10 NOTE — Therapy (Signed)
OUTPATIENT PHYSICAL THERAPY CERVICAL EVALUATION   Patient Name: Loretta Bullock MRN: [id number] DOB:[birth year], 62 y.o., female Today's Date: 05/11/2023  END OF SESSION:  PT End of Session - 05/11/23 0755     Visit Number 1    Date for PT Re-Evaluation 06/22/23    Authorization Type BCBS    PT Start Time 0759    PT Stop Time 0840    PT Time Calculation (min) 41 min    Activity Tolerance Patient tolerated treatment well    Behavior During Therapy WFL for tasks assessed/performed             Past Medical History:  Diagnosis Date   Anxiety    DDD (degenerative disc disease), cervical    History of lumbar laminectomy    Hypertension    PONV (postoperative nausea and vomiting)    Sleep apnea    CPAP 7 cm water   Past Surgical History:  Procedure Laterality Date   BACK SURGERY     CESAREAN SECTION  2002   LUMBAR DISC SURGERY  06/2013   Dr. Fabio Asa   LUMBAR FUSION  08/17/2017   MAXIMUM ACCESS (MAS)POSTERIOR LUMBAR INTERBODY FUSION (PLIF) 1 LEVEL  2016   Patient Active Problem List   Diagnosis Date Noted   DDD (degenerative disc disease), cervical 04/28/2023   Anxiety 07/20/2019   Hot flashes 07/20/2019   Primary osteoarthritis of right knee 03/04/2017   Subclinical hypothyroidism 05/01/2014   Lumbar stenosis 07/03/2013   Lumbar degenerative disc disease 01/11/2013   Severe obesity (BMI 35.0-35.9 with comorbidity) (HCC) 11/15/2011   Sleep apnea 01/20/2011   Episodic mood disorder (HCC) 11/16/2010   Controlled type 2 diabetes mellitus without complication, without long-term current use of insulin (HCC) 11/16/2010   Hyperlipidemia 09/01/2010   HYPERTENSION, BENIGN 09/01/2010    PCP: Agapito Games, MD   REFERRING PROVIDER: Monica Becton,*   REFERRING DIAG: M50.30 (ICD-10-CM) - DDD (degenerative disc disease), cervical   THERAPY DIAG:  Cervicalgia  Cramp and spasm  Rationale for Evaluation and Treatment: Rehabilitation  ONSET  DATE: 2 months ago  SUBJECTIVE:                                                                                                                                                                                                         SUBJECTIVE STATEMENT: Two months ago started having neck pain and it has progressively worsened. Pain is right sided up to skull. She reports some N/T on left side and sometimes has B thumb numbness usually when looking down reading.  Muscle relaxors help at night and biofreeze. Prednisone helped but now it's back. Sleep affected. Headaches every other day. Hand dominance: Right  PERTINENT HISTORY:  Cervical DDD, lumbar fusion anxiety  PAIN:  Are you having pain? Yes: NPRS scale: 5-6/10 Pain location: right side up to skull Pain description: dull ache and gets sharp with movment Aggravating factors: all motion, worse with RSB Relieving factors: muscle relaxors and biofreeze helps immediately  PRECAUTIONS: None  RED FLAGS: None     WEIGHT BEARING RESTRICTIONS: No  FALLS:  Has patient fallen in last 6 months? No  LIVING ENVIRONMENT: Lives with: lives with their spouse Lives in: House/apartment   OCCUPATION: on computer all day, three screens  PLOF: Independent  PATIENT GOALS: relieve the pain  NEXT MD VISIT: 6 weeks  OBJECTIVE:   DIAGNOSTIC FINDINGS:  XR IMPRESSION: Mild-to-moderate multilevel degenerative changes of the cervical spine with mild osseous neuroforaminal narrowing most pronounced at C3-4 and C4-5.  PATIENT SURVEYS:  FOTO 42 goal 28  COGNITION: Overall cognitive status: Within functional limits for tasks assessed  SENSATION: WFL  POSTURE: rounded shoulders, forward head, and increased lumbar lordosis  PALPATION: L UT, levator, cervical paraspinals and suboccipitals PA mobs of cspine WNL   CERVICAL ROM: *pain   Active ROM A/PROM (deg) eval  Flexion Full tight  Extension 38*  Right lateral flexion 17*  Left  lateral flexion 35 tight  Right rotation 49*  Left rotation 50 tight   (Blank rows = not tested)  UPPER EXTREMITY ROM: WNL  UPPER EXTREMITY MMT: grossly 5/5 B UE, denies grip weakness  CERVICAL SPECIAL TESTS:  Spurling's test: Negative and Distraction test: Positive Distraction with right rotation x 5 improves pain and motion   TODAY'S TREATMENT:                                                                                                                              DATE:   05/11/23  See pt ed and HEP  Manual Therapy: to decrease muscle spasm and pain and improve mobility Skilled palpation and monitoring of soft tissues during DN. STM to B c-spine, L UT Trigger Point Dry-Needling  Treatment instructions: Expect mild to moderate muscle soreness. S/S of pneumothorax if dry needled over a lung field, and to seek immediate medical attention should they occur. Patient verbalized understanding of these instructions and education. Patient Consent Given: Yes Education handout provided: Yes Muscles treated: left upper trap, cervial multifidi and suboccipitals Electrical stimulation performed: No Parameters: N/A Treatment response/outcome: Twitch Response Elicited and Palpable Increase in Muscle Length   PATIENT EDUCATION:  Education details: PT eval findings, anticipated POC, initial HEP, role of DN, and DN rational, procedure, outcomes, potential side effects, and recommended post-treatment exercises/activity  Person educated: Patient Education method: Explanation, Demonstration, and Handouts Education comprehension: verbalized understanding and returned demonstration  HOME EXERCISE PROGRAM: Access Code: [email or web address] URL: [email or web address] Date: 05/11/2023 Prepared by: Raynelle Fanning  Exercises - Shoulder External Rotation and  Scapular Retraction with Resistance  - 1 x daily - 3-4 x weekly - 1 sets - 10 reps - Seated Scapular Retraction  - 1 x daily - 7 x weekly - 1-3 sets -  10 reps - 2-3 sec hold - Doorway Pec Stretch at 90 Degrees Abduction  - 1 x daily - 7 x weekly - 1 sets - 3 reps - 30-60 seconds hold - Doorway Pec Stretch at 120 Degrees Abduction  - 2 x daily - 7 x weekly - 1 sets - 3 reps - 30-60 sec hold  ASSESSMENT:  CLINICAL IMPRESSION: Patient is a 62 y.o. female who was seen today for physical therapy evaluation and treatment for neck pain beginning about 2 months ago and progressively worsening to where she has pain with most neck motion and limiting her sleep. She also has headaches multiple times per week. She has deficits in cervical ROM and also reports intermittent numbness into both thumbs. She had relief with manual distraction and may benefit from trial of mechanical traction. Initial trial of DN/MT today with excellent twitch responses and decreased tissue tension immediately. Patient will benefit from skilled PT to address these deficits.    OBJECTIVE IMPAIRMENTS: decreased activity tolerance, decreased ROM, decreased strength, increased muscle spasms, impaired flexibility, postural dysfunction, and pain.   ACTIVITY LIMITATIONS: bending and sleeping  PARTICIPATION LIMITATIONS: driving and occupation  PERSONAL FACTORS: Fitness are also affecting patient's functional outcome.   REHAB POTENTIAL: Excellent  CLINICAL DECISION MAKING: Stable/uncomplicated  EVALUATION COMPLEXITY: Low   GOALS: Goals reviewed with patient? Yes  SHORT TERM GOALS: Target date: 06/01/23  Independent with initial HEP Baseline:  Goal status: INITIAL  2.  Decreased HA and pain by 30% Baseline:  Goal status: INITIAL   LONG TERM GOALS: Target date: 06/22/23  Ind with advanced HEP Baseline:  Goal status: INITIAL  2.  Decreased neck pain and HA by 75% to with ADLs to improve QOL Baseline:  Goal status: INITIAL  3.  Improved cervical to [facility] without pain provocation Baseline:  Goal status: INITIAL  4.  Improved FOTO to 58 Baseline: 42 Goal status:  INITIAL  5.  Able to sleep without waking from neck pain Baseline:  Goal status: INITIAL    PLAN:  PT FREQUENCY: 2x/week  PT DURATION: 6 weeks  PLANNED INTERVENTIONS: Therapeutic exercises, Therapeutic activity, Neuromuscular re-education, Patient/Family education, Self Care, Joint mobilization, Dry Needling, Electrical stimulation, Spinal mobilization, Cryotherapy, Moist heat, Taping, Traction, Ultrasound, and Manual therapy  PLAN FOR NEXT SESSION: Assess response to DN and continue as indicated. Review and progress HEP, Check ULTT. Trial of mechanical traction.   Solon Palm, PT  05/11/2023, 10:05 AM

## 2023-05-11 ENCOUNTER — Other Ambulatory Visit: Payer: Self-pay

## 2023-05-11 ENCOUNTER — Ambulatory Visit: Payer: BC Managed Care – PPO | Attending: Sports Medicine | Admitting: Physical Therapy

## 2023-05-11 ENCOUNTER — Encounter: Payer: Self-pay | Admitting: Physical Therapy

## 2023-05-11 DIAGNOSIS — R252 Cramp and spasm: Secondary | ICD-10-CM | POA: Insufficient documentation

## 2023-05-11 DIAGNOSIS — M5030 Other cervical disc degeneration, unspecified cervical region: Secondary | ICD-10-CM | POA: Diagnosis present

## 2023-05-11 DIAGNOSIS — M542 Cervicalgia: Secondary | ICD-10-CM

## 2023-05-13 ENCOUNTER — Other Ambulatory Visit: Payer: Self-pay | Admitting: Family Medicine

## 2023-05-13 MED ORDER — ATENOLOL 25 MG PO TABS: 25.0000 mg | ORAL_TABLET | Freq: Every day | ORAL | 0 refills | Status: DC

## 2023-05-16 ENCOUNTER — Encounter: Payer: Self-pay | Admitting: Rehabilitative and Restorative Service Providers"

## 2023-05-16 ENCOUNTER — Ambulatory Visit: Payer: BC Managed Care – PPO | Admitting: Rehabilitative and Restorative Service Providers"

## 2023-05-16 DIAGNOSIS — R252 Cramp and spasm: Secondary | ICD-10-CM

## 2023-05-16 DIAGNOSIS — M5030 Other cervical disc degeneration, unspecified cervical region: Secondary | ICD-10-CM | POA: Diagnosis not present

## 2023-05-16 DIAGNOSIS — M542 Cervicalgia: Secondary | ICD-10-CM

## 2023-05-16 NOTE — Therapy (Signed)
OUTPATIENT PHYSICAL THERAPY CERVICAL TREATMENT   Patient Name: Loretta Bullock MRN: [id number] DOB:1961/09/16, 62 y.o., female Today's Date: 05/16/2023  END OF SESSION:  PT End of Session - 05/16/23 1536     Visit Number 2    Date for PT Re-Evaluation 06/22/23    Authorization Type BCBS    PT Start Time 1536    PT Stop Time 1622    PT Time Calculation (min) 46 min             Past Medical History:  Diagnosis Date   Anxiety    DDD (degenerative disc disease), cervical    History of lumbar laminectomy    Hypertension    PONV (postoperative nausea and vomiting)    Sleep apnea    CPAP 7 cm water   Past Surgical History:  Procedure Laterality Date   BACK SURGERY     CESAREAN SECTION  2002   LUMBAR DISC SURGERY  06/2013   Dr. Fabio Asa   LUMBAR FUSION  08/17/2017   MAXIMUM ACCESS (MAS)POSTERIOR LUMBAR INTERBODY FUSION (PLIF) 1 LEVEL  2016   Patient Active Problem List   Diagnosis Date Noted   DDD (degenerative disc disease), cervical 04/28/2023   Anxiety 07/20/2019   Hot flashes 07/20/2019   Primary osteoarthritis of right knee 03/04/2017   Subclinical hypothyroidism 05/01/2014   Lumbar stenosis 07/03/2013   Lumbar degenerative disc disease 01/11/2013   Severe obesity (BMI 35.0-35.9 with comorbidity) (HCC) 11/15/2011   Sleep apnea 01/20/2011   Episodic mood disorder (HCC) 11/16/2010   Controlled type 2 diabetes mellitus without complication, without long-term current use of insulin (HCC) 11/16/2010   Hyperlipidemia 09/01/2010   HYPERTENSION, BENIGN 09/01/2010    PCP: Agapito Games, MD   REFERRING PROVIDER: Monica Becton,*   REFERRING DIAG: M50.30 (ICD-10-CM) - DDD (degenerative disc disease), cervical   THERAPY DIAG:  Cervicalgia  Cramp and spasm  Rationale for Evaluation and Treatment: Rehabilitation  ONSET DATE: 2 months ago  SUBJECTIVE:                                                                                                                                                                                                          SUBJECTIVE STATEMENT: Patient reports that she was very sore following the dry needling. She can't tell much difference in symptoms from DN. She has had DN in the past with good results. 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 Enjoys reading when not at work.   Evaluation:  Two months ago started having neck pain and it has progressively worsened. Pain is right sided up to skull. She reports some N/T on left side and sometimes has B thumb numbness usually when looking down reading.  Muscle relaxors help at night and biofreeze. Prednisone helped but now it's back. Sleep affected. Headaches every other day. Hand dominance: Right  PERTINENT HISTORY:  Cervical DDD, lumbar fusion anxiety  PAIN:  Are you having pain? Yes: NPRS scale: 6/10 Pain location: right side up to skull Pain description: dull ache and gets sharp with movment Aggravating factors: all motion, worse with RSB Relieving factors: muscle relaxors and biofreeze helps immediately  PRECAUTIONS: None  WEIGHT BEARING RESTRICTIONS: No  FALLS:  Has patient fallen in last 6 months? No  LIVING ENVIRONMENT: Lives with: lives with their spouse Lives in: House/apartment   OCCUPATION: on computer all day, three screens  PATIENT GOALS: relieve the pain  NEXT MD VISIT: 6 weeks  OBJECTIVE:   DIAGNOSTIC FINDINGS:  XR IMPRESSION: Mild-to-moderate multilevel degenerative changes of the cervical spine with mild osseous neuroforaminal narrowing most pronounced at C3-4 and C4-5.  PATIENT SURVEYS:  FOTO 42 goal 58   POSTURE: rounded shoulders, forward head, and increased lumbar lordosis  PALPATION: L UT, levator, cervical paraspinals and suboccipitals PA mobs of cspine WNL   CERVICAL ROM: *pain   Active ROM A/PROM (deg) eval  Flexion Full tight  Extension 38*  Right  lateral flexion 17*  Left lateral flexion 35 tight  Right rotation 49*  Left rotation 50 tight   (Blank rows = not tested)  UPPER EXTREMITY ROM: WNL  UPPER EXTREMITY MMT: grossly 5/5 B UE, denies grip weakness  CERVICAL SPECIAL TESTS:  Spurling's test: Negative and Distraction test: Positive Distraction with right rotation x 5 improves pain and motion   [facility] Adult PT Treatment:                                                DATE: 05/16/23 Therapeutic Exercise: Standing  Doorway stretch 3 positions 30 sec x 2 each position Chin tuck with noodle 5 sec x 5 Scap squeeze with noodle 5-10 sec x 10  L's with noodle 5 sec x 10  W's with noodle 5 sec x 10  Sitting  Scap squeeze Chin tuck  Supine  T - Prolonged snow angel lying on noodle UE's ~ 80 deg abduction supported ~ 2 min   Manual Therapy: Patient supine myofacial release through pecs; scapular depression; cervical mobs PA and lateral glides; deep tissue work through R > L ant/lat/posterior cervical musculature; passive ROM cervical spine into cervical flexion; cervical flexion with slight rotation; lateral cervical flexion(scaleni stretches x 3 to L); manual traction 20-30 sec x 3; traction through long arm R UE at side to stretch through upper trap R  Neuromuscular re-ed: Working on posture and alignment in sitting and standing  Trial of L's with red TB increased tension in upper trap - patient will work on scap retraction with ER without resistance and add yellow TB when she has no increased tightness or discomfort with resistive exercise  Therapeutic Activity: Myofacial ball release using 4 inch plastic ball patient standing at wall - work through the posterior shoulder girdle and into pecs  Self Care: Use of noodle for sitting posture and alignment Issued ergonomic handout for patient to assess work station  and ask questions as needed     TODAY'S TREATMENT:                                                                                                                               DATE:   05/11/23  See pt ed and HEP  Manual Therapy: to decrease muscle spasm and pain and improve mobility Skilled palpation and monitoring of soft tissues during DN. STM to B c-spine, L UT Trigger Point Dry-Needling  Treatment instructions: Expect mild to moderate muscle soreness. S/S of pneumothorax if dry needled over a lung field, and to seek immediate medical attention should they occur. Patient verbalized understanding of these instructions and education. Patient Consent Given: Yes Education handout provided: Yes Muscles treated: left upper trap, cervial multifidi and suboccipitals Electrical stimulation performed: No Parameters: N/A Treatment response/outcome: Twitch Response Elicited and Palpable Increase in Muscle Length   PATIENT EDUCATION:  Education details: PT eval findings, anticipated POC, initial HEP, role of DN, and DN rational, procedure, outcomes, potential side effects, and recommended post-treatment exercises/activity  Person educated: Patient Education method: Explanation, Demonstration, and Handouts Education comprehension: verbalized understanding and returned demonstration  HOME EXERCISE PROGRAM: Access Code: [email or web address] URL: [email or web address] Date: 05/16/2023 Prepared by: Corlis Leak  Exercises - Seated Scapular Retraction  - 1 x daily - 7 x weekly - 1-3 sets - 10 reps - 2-3 sec hold - Doorway Pec Stretch at 60 Degrees Abduction  - 3 x daily - 7 x weekly - 1 sets - 3 reps - Doorway Pec Stretch at 90 Degrees Abduction  - 3 x daily - 7 x weekly - 1 sets - 3 reps - 30 seconds  hold - Doorway Pec Stretch at 120 Degrees Abduction  - 3 x daily - 7 x weekly - 1 sets - 3 reps - 30 second hold  hold - Seated Cervical Retraction  - 2 x daily - 7 x weekly - 1-2 sets - 5-10 reps - 10 sec  hold - Seated Scapular Retraction  - 2 x daily - 7 x weekly - 1-2 sets - 10 reps - 10 sec  hold - Shoulder  External Rotation and Scapular Retraction  - 3 x daily - 7 x weekly - 1 sets - 10 reps - 3-5 sec   hold - Standing Shoulder W at Wall  - 1-2 x daily - 7 x weekly - 1 sets - 10 reps - 3 sec  hold - Supine Chest Stretch on Foam Roll  - 2 x daily - 7 x weekly - 1 sets - 1 reps - 2-5 min  sec  hold - Standing Infraspinatus/Teres Minor Release with Ball at Wall  - 2 x daily - 7 x weekly - Standing Pectoral Release with Ball at Wall  - 3-4 x daily - 7 x weekly  Patient Education - Office Posture  ASSESSMENT:  CLINICAL IMPRESSION: Treatment consisted of postural education and correction; therapeutic exercise;  manual work; myofacial ball release for HEP. Patient responded well to treatment reporting decrease in pain and headache by 50% to 3/10.  Evaluation: Patient is a 62 y.o. female who was seen today for physical therapy evaluation and treatment for neck pain beginning about 2 months ago and progressively worsening to where she has pain with most neck motion and limiting her sleep. She also has headaches multiple times per week. She has deficits in cervical ROM and also reports intermittent numbness into both thumbs. She had relief with manual distraction and may benefit from trial of mechanical traction. Initial trial of DN/MT today with excellent twitch responses and decreased tissue tension immediately. Patient will benefit from skilled PT to address these deficits.    OBJECTIVE IMPAIRMENTS: decreased activity tolerance, decreased ROM, decreased strength, increased muscle spasms, impaired flexibility, postural dysfunction, and pain.    GOALS: Goals reviewed with patient? Yes  SHORT TERM GOALS: Target date: 06/01/23  Independent with initial HEP Baseline:  Goal status: INITIAL  2.  Decreased HA and pain by 30% Baseline:  Goal status: INITIAL   LONG TERM GOALS: Target date: 06/22/23  Ind with advanced HEP Baseline:  Goal status: INITIAL  2.  Decreased neck pain and HA by 75% to with  ADLs to improve QOL Baseline:  Goal status: INITIAL  3.  Improved cervical to [facility] without pain provocation Baseline:  Goal status: INITIAL  4.  Improved FOTO to 58 Baseline: 42 Goal status: INITIAL  5.  Able to sleep without waking from neck pain Baseline:  Goal status: INITIAL    PLAN:  PT FREQUENCY: 2x/week  PT DURATION: 6 weeks  PLANNED INTERVENTIONS: Therapeutic exercises, Therapeutic activity, Neuromuscular re-education, Patient/Family education, Self Care, Joint mobilization, Dry Needling, Electrical stimulation, Spinal mobilization, Cryotherapy, Moist heat, Taping, Traction, Ultrasound, and Manual therapy  PLAN FOR NEXT SESSION: continue with manual work and DN as indicated. Review and progress HEP. Trial of mechanical traction as indicated   Macil Crady P. Leonor Liv PT, MPH 05/16/23 3:37 PM

## 2023-05-17 ENCOUNTER — Encounter: Payer: BC Managed Care – PPO | Admitting: Physical Therapy

## 2023-05-19 ENCOUNTER — Ambulatory Visit: Payer: BC Managed Care – PPO

## 2023-05-19 DIAGNOSIS — M542 Cervicalgia: Secondary | ICD-10-CM

## 2023-05-19 DIAGNOSIS — R252 Cramp and spasm: Secondary | ICD-10-CM

## 2023-05-19 DIAGNOSIS — M5030 Other cervical disc degeneration, unspecified cervical region: Secondary | ICD-10-CM | POA: Diagnosis not present

## 2023-05-19 NOTE — Therapy (Signed)
OUTPATIENT PHYSICAL THERAPY CERVICAL TREATMENT   Patient Name: Loretta Bullock MRN: [id number] DOB:1961-08-22, 62 y.o., female Today's Date: 05/19/2023  END OF SESSION:  PT End of Session - 05/19/23 0800     Visit Number 3    Date for PT Re-Evaluation 06/22/23    Authorization Type BCBS    PT Start Time 0800    PT Stop Time 0848    PT Time Calculation (min) 48 min    Activity Tolerance Patient tolerated treatment well    Behavior During Therapy WFL for tasks assessed/performed             Past Medical History:  Diagnosis Date   Anxiety    DDD (degenerative disc disease), cervical    History of lumbar laminectomy    Hypertension    PONV (postoperative nausea and vomiting)    Sleep apnea    CPAP 7 cm water   Past Surgical History:  Procedure Laterality Date   BACK SURGERY     CESAREAN SECTION  2002   LUMBAR DISC SURGERY  06/2013   Dr. Fabio Asa   LUMBAR FUSION  08/17/2017   MAXIMUM ACCESS (MAS)POSTERIOR LUMBAR INTERBODY FUSION (PLIF) 1 LEVEL  2016   Patient Active Problem List   Diagnosis Date Noted   DDD (degenerative disc disease), cervical 04/28/2023   Anxiety 07/20/2019   Hot flashes 07/20/2019   Primary osteoarthritis of right knee 03/04/2017   Subclinical hypothyroidism 05/01/2014   Lumbar stenosis 07/03/2013   Lumbar degenerative disc disease 01/11/2013   Severe obesity (BMI 35.0-35.9 with comorbidity) (HCC) 11/15/2011   Sleep apnea 01/20/2011   Episodic mood disorder (HCC) 11/16/2010   Controlled type 2 diabetes mellitus without complication, without long-term current use of insulin (HCC) 11/16/2010   Hyperlipidemia 09/01/2010   HYPERTENSION, BENIGN 09/01/2010    PCP: Agapito Games, MD   REFERRING PROVIDER: Monica Becton,*   REFERRING DIAG: M50.30 (ICD-10-CM) - DDD (degenerative disc disease), cervical   THERAPY DIAG:  Cervicalgia  Cramp and spasm  Rationale for Evaluation and Treatment: Rehabilitation  ONSET DATE:  2 months ago  SUBJECTIVE:                                                                                                                                                                                                         SUBJECTIVE STATEMENT: Patient reports 6/10 pain along medial border of R scapula; states her neck is the same but the N/T down the arms is less.  Evaluation: Two months ago started having neck pain and it has progressively  worsened. Pain is right sided up to skull. She reports some N/T on left side and sometimes has B thumb numbness usually when looking down reading.  Muscle relaxors help at night and biofreeze. Prednisone helped but now it's back. Sleep affected. Headaches every other day. Hand dominance: Right  PERTINENT HISTORY:  Cervical DDD, lumbar fusion anxiety  PAIN:  Are you having pain? Yes: NPRS scale: 6/10 Pain location: right side up to skull Pain description: dull ache and gets sharp with movment Aggravating factors: all motion, worse with RSB Relieving factors: muscle relaxors and biofreeze helps immediately  PRECAUTIONS: None  WEIGHT BEARING RESTRICTIONS: No  FALLS:  Has patient fallen in last 6 months? No  LIVING ENVIRONMENT: Lives with: lives with their spouse Lives in: House/apartment   OCCUPATION: on computer all day, three screens  PATIENT GOALS: relieve the pain  NEXT MD VISIT: 6 weeks  OBJECTIVE:   DIAGNOSTIC FINDINGS:  XR IMPRESSION: Mild-to-moderate multilevel degenerative changes of the cervical spine with mild osseous neuroforaminal narrowing most pronounced at C3-4 and C4-5.  PATIENT SURVEYS:  FOTO 42 goal 58   POSTURE: rounded shoulders, forward head, and increased lumbar lordosis  PALPATION: L UT, levator, cervical paraspinals and suboccipitals PA mobs of cspine WNL   CERVICAL ROM: *pain   Active ROM A/PROM (deg) eval  Flexion Full tight  Extension 38*  Right lateral flexion 17*  Left lateral flexion 35  tight  Right rotation 49*  Left rotation 50 tight   (Blank rows = not tested)  UPPER EXTREMITY ROM: WNL  UPPER EXTREMITY MMT: grossly 5/5 B UE, denies grip weakness  CERVICAL SPECIAL TESTS:  Spurling's test: Negative and Distraction test: Positive Distraction with right rotation x 5 improves pain and motion    [facility] Adult PT Treatment:                                                DATE: 05/19/2023 Therapeutic Exercise: Doorway pec stretch 3x30" (3 positions) Seated thoracic extension over noodle 5x10" Standing with noodle:  scap squeeze, cueing chest lift 10x5" L arms 10x5" W arms 10x5" UE D2 1#DB (B) Hug a tree 1#DB Shaving Supine on vertical noodle: Angel arms Swimming 1#DB Small circle CW/CCW 1#DB Manual Therapy: IASTM rhomboids, upper/mid trap Modalities: Cervical traction (max 15#, min 5#) x10 min    [facility] Adult PT Treatment:                                                DATE: 05/16/23 Therapeutic Exercise: Standing  Doorway stretch 3 positions 30 sec x 2 each position Chin tuck with noodle 5 sec x 5 Scap squeeze with noodle 5-10 sec x 10  L's with noodle 5 sec x 10  W's with noodle 5 sec x 10  Sitting  Scap squeeze Chin tuck  Supine  T - Prolonged snow angel lying on noodle UE's ~ 80 deg abduction supported ~ 2 min   Manual Therapy: Patient supine myofacial release through pecs; scapular depression; cervical mobs PA and lateral glides; deep tissue work through R > L ant/lat/posterior cervical musculature; passive ROM cervical spine into cervical flexion; cervical flexion with slight rotation; lateral cervical flexion(scaleni stretches x 3 to L); manual traction 20-30  sec x 3; traction through long arm R UE at side to stretch through upper trap R  Neuromuscular re-ed: Working on posture and alignment in sitting and standing  Trial of L's with red TB increased tension in upper trap - patient will work on scap retraction with ER without resistance and add  yellow TB when she has no increased tightness or discomfort with resistive exercise  Therapeutic Activity: Myofacial ball release using 4 inch plastic ball patient standing at wall - work through the posterior shoulder girdle and into pecs  Self Care: Use of noodle for sitting posture and alignment Issued ergonomic handout for patient to assess work station and ask questions as needed     TODAY'S TREATMENT:                                                                                                                              DATE:   05/11/23  See pt ed and HEP  Manual Therapy: to decrease muscle spasm and pain and improve mobility Skilled palpation and monitoring of soft tissues during DN. STM to B c-spine, L UT Trigger Point Dry-Needling  Treatment instructions: Expect mild to moderate muscle soreness. S/S of pneumothorax if dry needled over a lung field, and to seek immediate medical attention should they occur. Patient verbalized understanding of these instructions and education. Patient Consent Given: Yes Education handout provided: Yes Muscles treated: left upper trap, cervial multifidi and suboccipitals Electrical stimulation performed: No Parameters: N/A Treatment response/outcome: Twitch Response Elicited and Palpable Increase in Muscle Length   PATIENT EDUCATION:  Education details: PT eval findings, anticipated POC, initial HEP, role of DN, and DN rational, procedure, outcomes, potential side effects, and recommended post-treatment exercises/activity  Person educated: Patient Education method: Explanation, Demonstration, and Handouts Education comprehension: verbalized understanding and returned demonstration  HOME EXERCISE PROGRAM: Access Code: [email or web address] URL: [email or web address] Date: 05/19/2023 Prepared by: Carlynn Herald  Exercises - Seated Scapular Retraction  - 1 x daily - 7 x weekly - 1-3 sets - 10 reps - 2-3 sec hold - Doorway Pec Stretch at 60  Degrees Abduction  - 3 x daily - 7 x weekly - 1 sets - 3 reps - Doorway Pec Stretch at 90 Degrees Abduction  - 3 x daily - 7 x weekly - 1 sets - 3 reps - 30 seconds  hold - Doorway Pec Stretch at 120 Degrees Abduction  - 3 x daily - 7 x weekly - 1 sets - 3 reps - 30 second hold  hold - Seated Cervical Retraction  - 2 x daily - 7 x weekly - 1-2 sets - 5-10 reps - 10 sec  hold - Seated Scapular Retraction  - 2 x daily - 7 x weekly - 1-2 sets - 10 reps - 10 sec  hold - Shoulder External Rotation and Scapular Retraction  - 3 x daily - 7 x weekly - 1 sets - 10 reps - 3-5 sec  hold - Standing Shoulder W at Wall  - 1-2 x daily - 7 x weekly - 1 sets - 10 reps - 3 sec  hold - Supine Chest Stretch on Foam Roll  - 2 x daily - 7 x weekly - 1 sets - 1 reps - 2-5 min  sec  hold - Standing Infraspinatus/Teres Minor Release with Ball at [employer]  - 2 x daily - 7 x weekly - Standing Pectoral Release with Ball at Wall  - 3-4 x daily - 7 x weekly - Quadruped Thoracic Rotation - Reach Under  - 1 x daily - 7 x weekly - 3 sets - 10 reps  Patient Education - Office Posture  ASSESSMENT:  CLINICAL IMPRESSION: Postural exercises continued, adding thoracic mobility exercises to address subjective symptoms today. Cervical mechanical traction trialed today with no adverse reactions.   Evaluation: Patient is a 62 y.o. female who was seen today for physical therapy evaluation and treatment for neck pain beginning about 2 months ago and progressively worsening to where she has pain with most neck motion and limiting her sleep. She also has headaches multiple times per week. She has deficits in cervical ROM and also reports intermittent numbness into both thumbs. She had relief with manual distraction and may benefit from trial of mechanical traction. Initial trial of DN/MT today with excellent twitch responses and decreased tissue tension immediately. Patient will benefit from skilled PT to address these deficits.    OBJECTIVE  IMPAIRMENTS: decreased activity tolerance, decreased ROM, decreased strength, increased muscle spasms, impaired flexibility, postural dysfunction, and pain.    GOALS: Goals reviewed with patient? Yes  SHORT TERM GOALS: Target date: 06/01/23  Independent with initial HEP Baseline:  Goal status: INITIAL  2.  Decreased HA and pain by 30% Baseline:  Goal status: INITIAL   LONG TERM GOALS: Target date: 06/22/23  Ind with advanced HEP Baseline:  Goal status: INITIAL  2.  Decreased neck pain and HA by 75% to with ADLs to improve QOL Baseline:  Goal status: INITIAL  3.  Improved cervical to [facility] without pain provocation Baseline:  Goal status: INITIAL  4.  Improved FOTO to 58 Baseline: 42 Goal status: INITIAL  5.  Able to sleep without waking from neck pain Baseline:  Goal status: INITIAL    PLAN:  PT FREQUENCY: 2x/week  PT DURATION: 6 weeks  PLANNED INTERVENTIONS: Therapeutic exercises, Therapeutic activity, Neuromuscular re-education, Patient/Family education, Self Care, Joint mobilization, Dry Needling, Electrical stimulation, Spinal mobilization, Cryotherapy, Moist heat, Taping, Traction, Ultrasound, and Manual therapy  PLAN FOR NEXT SESSION: Follow-up on how she felt after traction at last appt. Continue with manual work and DN as indicated. Review and progress HEP. Mechanical traction as indicated   Carlynn Herald, PTA 05/19/23 8:48 AM

## 2023-05-25 ENCOUNTER — Encounter: Payer: Self-pay | Admitting: Rehabilitative and Restorative Service Providers"

## 2023-05-25 ENCOUNTER — Ambulatory Visit: Payer: BC Managed Care – PPO | Attending: Sports Medicine | Admitting: Rehabilitative and Restorative Service Providers"

## 2023-05-25 DIAGNOSIS — R252 Cramp and spasm: Secondary | ICD-10-CM | POA: Diagnosis present

## 2023-05-25 DIAGNOSIS — M542 Cervicalgia: Secondary | ICD-10-CM | POA: Diagnosis present

## 2023-05-25 NOTE — Therapy (Addendum)
OUTPATIENT PHYSICAL THERAPY CERVICAL TREATMENT   Patient Name: Loretta Bullock MRN: [id number] DOB:[birth year], 62 y.o., female Today's Date: 05/25/2023  END OF SESSION:  PT End of Session - 05/25/23 0806     Visit Number 4    Date for PT Re-Evaluation 06/22/23    Authorization Type BCBS    PT Start Time 0804    PT Stop Time 0852    PT Time Calculation (min) 48 min    Activity Tolerance Patient tolerated treatment well             Past Medical History:  Diagnosis Date   Anxiety    DDD (degenerative disc disease), cervical    History of lumbar laminectomy    Hypertension    PONV (postoperative nausea and vomiting)    Sleep apnea    CPAP 7 cm water   Past Surgical History:  Procedure Laterality Date   BACK SURGERY     CESAREAN SECTION  2002   LUMBAR DISC SURGERY  06/2013   Dr. Fabio Asa   LUMBAR FUSION  08/17/2017   MAXIMUM ACCESS (MAS)POSTERIOR LUMBAR INTERBODY FUSION (PLIF) 1 LEVEL  2016   Patient Active Problem List   Diagnosis Date Noted   DDD (degenerative disc disease), cervical 04/28/2023   Anxiety 07/20/2019   Hot flashes 07/20/2019   Primary osteoarthritis of right knee 03/04/2017   Subclinical hypothyroidism 05/01/2014   Lumbar stenosis 07/03/2013   Lumbar degenerative disc disease 01/11/2013   Severe obesity (BMI 35.0-35.9 with comorbidity) (HCC) 11/15/2011   Sleep apnea 01/20/2011   Episodic mood disorder (HCC) 11/16/2010   Controlled type 2 diabetes mellitus without complication, without long-term current use of insulin (HCC) 11/16/2010   Hyperlipidemia 09/01/2010   HYPERTENSION, BENIGN 09/01/2010    PCP: Agapito Games, MD   REFERRING PROVIDER: Monica Becton,*   REFERRING DIAG: M50.30 (ICD-10-CM) - DDD (degenerative disc disease), cervical   THERAPY DIAG:  Cervicalgia  Cramp and spasm  Rationale for Evaluation and Treatment: Rehabilitation  ONSET DATE: 2 months ago  SUBJECTIVE:                                                                                                                                                                                                          SUBJECTIVE STATEMENT: Patient reports pain s worse. She has been working more at computer and doing some canning at home.  Working on exercises some but not enough. Pain along medial border of R scapula and in her neck. Flonnie Hailstone is having a little tingling in the arms L >  R - reading when she felt the numbness.  Evaluation: Two months ago started having neck pain and it has progressively worsened. Pain is right sided up to skull. She reports some N/T on left side and sometimes has B thumb numbness usually when looking down reading.  Muscle relaxors help at night and biofreeze. Prednisone helped but now it's back. Sleep affected. Headaches every other day. Hand dominance: Right  PERTINENT HISTORY:  Cervical DDD, lumbar fusion anxiety  PAIN:  Are you having pain? Yes: NPRS scale: 8/10 Pain location: right side up to skull Pain description: dull ache and gets sharp with movment Aggravating factors: all motion, worse with RSB Relieving factors: muscle relaxors and biofreeze helps immediately  PRECAUTIONS: None  WEIGHT BEARING RESTRICTIONS: No  FALLS:  Has patient fallen in last 6 months? No  LIVING ENVIRONMENT: Lives with: lives with their spouse Lives in: House/apartment   OCCUPATION: on computer all day, three screens  PATIENT GOALS: relieve the pain  NEXT MD VISIT: 6 weeks  OBJECTIVE:   DIAGNOSTIC FINDINGS:  XR IMPRESSION: Mild-to-moderate multilevel degenerative changes of the cervical spine with mild osseous neuroforaminal narrowing most pronounced at C3-4 and C4-5.  PATIENT SURVEYS:  FOTO 42 goal 58   POSTURE: rounded shoulders, forward head, and increased lumbar lordosis  PALPATION: L UT, levator, cervical paraspinals and suboccipitals PA mobs of cspine WNL   CERVICAL ROM: *pain   Active ROM A/PROM  (deg) eval  Flexion Full tight  Extension 38*  Right lateral flexion 17*  Left lateral flexion 35 tight  Right rotation 49*  Left rotation 50 tight   (Blank rows = not tested)  UPPER EXTREMITY ROM: WNL  UPPER EXTREMITY MMT: grossly 5/5 B UE, denies grip weakness  CERVICAL SPECIAL TESTS:  Spurling's test: Negative and Distraction test: Positive Distraction with right rotation x 5 improves pain and motion  [facility] Adult PT Treatment:                                                DATE: 05/25/23 Therapeutic Exercise: Standing  Doorway stretch 3 positions 30 sec x 2 each position Chin tuck with noodle 5 sec x 5 Scap squeeze with noodle 5-10 sec x 10  L's with noodle yellow TB  5 sec x 10  W's with noodle yellow TB  5 sec x 10  Sitting  Scap squeeze Chin tuck  Coregeous bal T spine  Supine   Manual Therapy: Skilled palpation to assess response tomanual work and DN  Trigger Point Dry-Needling  Treatment instructions: Expect mild to moderate muscle soreness. S/S of pneumothorax if dry needled over a lung field, and to seek immediate medical attention should they occur. Patient verbalized understanding of these instructions and education.  Patient Consent Given: Yes Education handout provided: Previously provided Muscles treated: R thoracic paraspinals; suboccipitals; cervical paraspinals; scaleni Electrical stimulation performed: Yes Parameters: mAmp current intensity to pt tolerance  Treatment response/outcome: decreased palpable tightness; decreased pain per pt report   Working on posture and alignment in sitting and standing  Therapeutic Activity: Myofacial ball release using 4 inch plastic ball patient standing at wall - work through the posterior shoulder girdle and into pecs  Self Care: Use of noodle for sitting posture and alignment Encouraged ergonomic correction for  work station and ask questions as needed   [facility] Adult PT Treatment:  DATE: 05/19/2023 Therapeutic Exercise: Doorway pec stretch 3x30" (3 positions) Seated thoracic extension over noodle 5x10" Standing with noodle:  scap squeeze, cueing chest lift 10x5" L arms 10x5" W arms 10x5" UE D2 1#DB (B) Hug a tree 1#DB Shaving Supine on vertical noodle: Angel arms Swimming 1#DB Small circle CW/CCW 1#DB Manual Therapy: IASTM rhomboids, upper/mid trap Modalities: Cervical traction (max 15#, min 5#) x10 min    [facility] Adult PT Treatment:                                                DATE: 05/16/23 Therapeutic Exercise: Standing  Doorway stretch 3 positions 30 sec x 2 each position Chin tuck with noodle 5 sec x 5 Scap squeeze with noodle 5-10 sec x 10  L's with noodle 5 sec x 10  W's with noodle 5 sec x 10  Sitting  Scap squeeze Chin tuck  Supine  T - Prolonged snow angel lying on noodle UE's ~ 80 deg abduction supported ~ 2 min   Manual Therapy: Patient supine myofacial release through pecs; scapular depression; cervical mobs PA and lateral glides; deep tissue work through R > L ant/lat/posterior cervical musculature; passive ROM cervical spine into cervical flexion; cervical flexion with slight rotation; lateral cervical flexion(scaleni stretches x 3 to L); manual traction 20-30 sec x 3; traction through long arm R UE at side to stretch through upper trap R  Neuromuscular re-ed: Working on posture and alignment in sitting and standing  Trial of L's with red TB increased tension in upper trap - patient will work on scap retraction with ER without resistance and add yellow TB when she has no increased tightness or discomfort with resistive exercise  Therapeutic Activity: Myofacial ball release using 4 inch plastic ball patient standing at wall - work through the posterior shoulder girdle and into pecs  Self Care: Use of noodle for sitting posture and alignment Issued ergonomic handout for patient to assess work station and ask questions as needed      TODAY'S TREATMENT:                                                                                                                              DATE:   05/11/23  See pt ed and HEP  Manual Therapy: to decrease muscle spasm and pain and improve mobility Skilled palpation and monitoring of soft tissues during DN. STM to B c-spine, L UT Trigger Point Dry-Needling  Treatment instructions: Expect mild to moderate muscle soreness. S/S of pneumothorax if dry needled over a lung field, and to seek immediate medical attention should they occur. Patient verbalized understanding of these instructions and education. Patient Consent Given: Yes Education handout provided: Yes Muscles treated: left upper trap, cervial multifidi and suboccipitals Electrical stimulation performed: No Parameters: N/A Treatment response/outcome: Twitch Response  Elicited and Palpable Increase in Muscle Length   PATIENT EDUCATION:  Education details: PT eval findings, anticipated POC, initial HEP, role of DN, and DN rational, procedure, outcomes, potential side effects, and recommended post-treatment exercises/activity  Person educated: Patient Education method: Explanation, Demonstration, and Handouts Education comprehension: verbalized understanding and returned demonstration  HOME EXERCISE PROGRAM: Access Code: [email or web address] URL: [email or web address] Date: 05/25/2023 Prepared by: Corlis Leak  Exercises - Seated Scapular Retraction  - 1 x daily - 7 x weekly - 1-3 sets - 10 reps - 2-3 sec hold - Doorway Pec Stretch at 60 Degrees Abduction  - 3 x daily - 7 x weekly - 1 sets - 3 reps - Doorway Pec Stretch at 90 Degrees Abduction  - 3 x daily - 7 x weekly - 1 sets - 3 reps - 30 seconds  hold - Doorway Pec Stretch at 120 Degrees Abduction  - 3 x daily - 7 x weekly - 1 sets - 3 reps - 30 second hold  hold - Seated Cervical Retraction  - 2 x daily - 7 x weekly - 1-2 sets - 5-10 reps - 10 sec  hold - Seated  Scapular Retraction  - 2 x daily - 7 x weekly - 1-2 sets - 10 reps - 10 sec  hold - Shoulder External Rotation and Scapular Retraction  - 3 x daily - 7 x weekly - 1 sets - 10 reps - 3-5 sec   hold - Standing Shoulder W at Wall  - 1-2 x daily - 7 x weekly - 1 sets - 10 reps - 3 sec  hold - Supine Chest Stretch on Foam Roll  - 2 x daily - 7 x weekly - 1 sets - 1 reps - 2-5 min  sec  hold - Standing Infraspinatus/Teres Minor Release with Ball at [employer]  - 2 x daily - 7 x weekly - Standing Pectoral Release with Ball at [employer]  - 3-4 x daily - 7 x weekly - Quadruped Thoracic Rotation - Reach Under  - 1 x daily - 7 x weekly - 3 sets - 10 reps - Shoulder External Rotation and Scapular Retraction with Resistance  - 2 x daily - 7 x weekly - 1 sets - 10 reps - 3-5 sec  hold - Shoulder W - External Rotation with Resistance  - 2 x daily - 7 x weekly - 1-2 sets - 10 reps - 3 sec  hold  Patient Education - Office Posture  ASSESSMENT:  CLINICAL IMPRESSION: Good response to treatment including Dn and manual work. Postural exercises continued   Evaluation: Patient is a 62 y.o. female who was seen today for physical therapy evaluation and treatment for neck pain beginning about 2 months ago and progressively worsening to where she has pain with most neck motion and limiting her sleep. She also has headaches multiple times per week. She has deficits in cervical ROM and also reports intermittent numbness into both thumbs. She had relief with manual distraction and may benefit from trial of mechanical traction. Initial trial of DN/MT today with excellent twitch responses and decreased tissue tension immediately. Patient will benefit from skilled PT to address these deficits.    OBJECTIVE IMPAIRMENTS: decreased activity tolerance, decreased ROM, decreased strength, increased muscle spasms, impaired flexibility, postural dysfunction, and pain.    GOALS: Goals reviewed with patient? Yes  SHORT TERM GOALS: Target  date: 06/01/23  Independent with initial HEP Baseline:  Goal status: INITIAL  2.  Decreased HA and pain  by 30% Baseline:  Goal status: INITIAL   LONG TERM GOALS: Target date: 06/22/23  Ind with advanced HEP Baseline:  Goal status: INITIAL  2.  Decreased neck pain and HA by 75% to with ADLs to improve QOL Baseline:  Goal status: INITIAL  3.  Improved cervical to [facility] without pain provocation Baseline:  Goal status: INITIAL  4.  Improved FOTO to 58 Baseline: 42 Goal status: INITIAL  5.  Able to sleep without waking from neck pain Baseline:  Goal status: INITIAL    PLAN:  PT FREQUENCY: 2x/week  PT DURATION: 6 weeks  PLANNED INTERVENTIONS: Therapeutic exercises, Therapeutic activity, Neuromuscular re-education, Patient/Family education, Self Care, Joint mobilization, Dry Needling, Electrical stimulation, Spinal mobilization, Cryotherapy, Moist heat, Taping, Traction, Ultrasound, and Manual therapy  PLAN FOR NEXT SESSION: Follow-up on how she felt after traction at last appt. Continue with manual work and DN as indicated. Review and progress HEP. Mechanical traction as indicated   Breeona Waid P. Leonor Liv PT, MPH 05/25/23 8:52 am

## 2023-05-31 ENCOUNTER — Ambulatory Visit: Payer: BC Managed Care – PPO | Admitting: Rehabilitative and Restorative Service Providers"

## 2023-05-31 ENCOUNTER — Encounter: Payer: Self-pay | Admitting: Rehabilitative and Restorative Service Providers"

## 2023-05-31 DIAGNOSIS — R252 Cramp and spasm: Secondary | ICD-10-CM

## 2023-05-31 DIAGNOSIS — M542 Cervicalgia: Secondary | ICD-10-CM

## 2023-05-31 NOTE — Therapy (Signed)
OUTPATIENT PHYSICAL THERAPY CERVICAL TREATMENT   Patient Name: Loretta Bullock MRN: [id number] DOB:Jun 23, 1961, 62 y.o., female Today's Date: 05/31/2023  END OF SESSION:  PT End of Session - 05/31/23 0808     Visit Number 5    Date for PT Re-Evaluation 06/22/23    Authorization Type BCBS    PT Start Time 0805    PT Stop Time 0853    PT Time Calculation (min) 48 min    Activity Tolerance Patient tolerated treatment well             Past Medical History:  Diagnosis Date   Anxiety    DDD (degenerative disc disease), cervical    History of lumbar laminectomy    Hypertension    PONV (postoperative nausea and vomiting)    Sleep apnea    CPAP 7 cm water   Past Surgical History:  Procedure Laterality Date   BACK SURGERY     CESAREAN SECTION  2002   LUMBAR DISC SURGERY  06/2013   Dr. Fabio Asa   LUMBAR FUSION  08/17/2017   MAXIMUM ACCESS (MAS)POSTERIOR LUMBAR INTERBODY FUSION (PLIF) 1 LEVEL  2016   Patient Active Problem List   Diagnosis Date Noted   DDD (degenerative disc disease), cervical 04/28/2023   Anxiety 07/20/2019   Hot flashes 07/20/2019   Primary osteoarthritis of right knee 03/04/2017   Subclinical hypothyroidism 05/01/2014   Lumbar stenosis 07/03/2013   Lumbar degenerative disc disease 01/11/2013   Severe obesity (BMI 35.0-35.9 with comorbidity) (HCC) 11/15/2011   Sleep apnea 01/20/2011   Episodic mood disorder (HCC) 11/16/2010   Controlled type 2 diabetes mellitus without complication, without long-term current use of insulin (HCC) 11/16/2010   Hyperlipidemia 09/01/2010   HYPERTENSION, BENIGN 09/01/2010    PCP: Agapito Games, MD   REFERRING PROVIDER: Monica Becton,*   REFERRING DIAG: M50.30 (ICD-10-CM) - DDD (degenerative disc disease), cervical   THERAPY DIAG:  Cervicalgia  Cramp and spasm  Rationale for Evaluation and Treatment: Rehabilitation  ONSET DATE: 2 months ago  SUBJECTIVE:                                                                                                                                                                                                          SUBJECTIVE STATEMENT: Patient reports decreased pain since yesterday when she was at work and stretched and felt a pop. Neck is sore but not as painful. She is having a little tingling in the arms L > R - reading when she felt the numbness but much less frequent  Evaluation: Two months ago started having neck pain and it has progressively worsened. Pain is right sided up to skull. She reports some N/T on left side and sometimes has B thumb numbness usually when looking down reading.  Muscle relaxors help at night and biofreeze. Prednisone helped but now it's back. Sleep affected. Headaches every other day. Hand dominance: Right  PERTINENT HISTORY:  Cervical DDD, lumbar fusion anxiety  PAIN:  Are you having pain? Yes: NPRS scale: 4/10 Pain location: right side up to skull Pain description: dull ache and gets sharp with movment Aggravating factors: all motion, worse with RSB Relieving factors: muscle relaxors and biofreeze helps immediately  PRECAUTIONS: None  WEIGHT BEARING RESTRICTIONS: No  FALLS:  Has patient fallen in last 6 months? No  LIVING ENVIRONMENT: Lives with: lives with their spouse Lives in: House/apartment   OCCUPATION: on computer all day, three screens  PATIENT GOALS: relieve the pain  NEXT MD VISIT: 6 weeks  OBJECTIVE:   DIAGNOSTIC FINDINGS:  XR IMPRESSION: Mild-to-moderate multilevel degenerative changes of the cervical spine with mild osseous neuroforaminal narrowing most pronounced at C3-4 and C4-5.  PATIENT SURVEYS:  FOTO 42 goal 58   POSTURE: rounded shoulders, forward head, and increased lumbar lordosis  PALPATION: L UT, levator, cervical paraspinals and suboccipitals PA mobs of cspine WNL   CERVICAL ROM: *pain   Active ROM A/PROM (deg) eval  Flexion Full tight  Extension  38*  Right lateral flexion 17*  Left lateral flexion 35 tight  Right rotation 49*  Left rotation 50 tight   (Blank rows = not tested)  UPPER EXTREMITY ROM: WNL  UPPER EXTREMITY MMT: grossly 5/5 B UE, denies grip weakness  CERVICAL SPECIAL TESTS:  Spurling's test: Negative and Distraction test: Positive Distraction with right rotation x 5 improves pain and motion  [facility] Adult PT Treatment:                                                DATE: 05/31/23 Therapeutic Exercise: Standing  Doorway stretch 3 positions 30 sec x 2 each position Chin tuck with noodle 5 sec x 5 Scap squeeze with noodle 5-10 sec x 10  L's with noodle  red TB  5 sec x 10  W's with noodle red TB  5 sec x 10  Row green TB 3 sec x 10  Shoulder extension green TB 3 sec x 10  Sitting  Scap squeeze Chin tuck  Coregeous bal T spine  Supine   Manual Therapy: Soft tissue mobilization working through pecs as well as ant/lat/post cervical musculature  PROM and stretch through cervical spine with manual cervical traction Therapeutic Activity: Mysofacial ball release suboccipitals using 2 golf balls - patient's head supported on pillow ~ 1-2 min  Myofacial ball release using 4 inch plastic ball patient standing at wall - work through the posterior shoulder girdle and into pecs  Working on posture and alignment in sitting and standing  Self Care: Use of noodle for sitting posture and alignment Encouraged ergonomic correction for  work station and ask questions as needed    [facility] Adult PT Treatment:  DATE: 05/25/23 Therapeutic Exercise: Standing  Doorway stretch 3 positions 30 sec x 2 each position Chin tuck with noodle 5 sec x 5 Scap squeeze with noodle 5-10 sec x 10  L's with noodle yellow TB  5 sec x 10  W's with noodle yellow TB  5 sec x 10  Sitting  Scap squeeze Chin tuck  Coregeous bal T spine  Supine   Manual Therapy: Skilled palpation to assess response  tomanual work and DN  Trigger Point Dry-Needling  Treatment instructions: Expect mild to moderate muscle soreness. S/S of pneumothorax if dry needled over a lung field, and to seek immediate medical attention should they occur. Patient verbalized understanding of these instructions and education.  Patient Consent Given: Yes Education handout provided: Previously provided Muscles treated: R thoracic paraspinals; suboccipitals; cervical paraspinals; scaleni Electrical stimulation performed: Yes Parameters: mAmp current intensity to pt tolerance  Treatment response/outcome: decreased palpable tightness; decreased pain per pt report   Working on posture and alignment in sitting and standing  Therapeutic Activity: Myofacial ball release using 4 inch plastic ball patient standing at wall - work through the posterior shoulder girdle and into pecs  Self Care: Use of noodle for sitting posture and alignment Encouraged ergonomic correction for  work station and ask questions as needed    PATIENT EDUCATION:  Education details: PT eval findings, anticipated POC, initial HEP, role of DN, and DN rational, procedure, outcomes, potential side effects, and recommended post-treatment exercises/activity  Person educated: Patient Education method: Explanation, Demonstration, and Handouts Education comprehension: verbalized understanding and returned demonstration  HOME EXERCISE PROGRAM: Access Code: [email or web address] URL: [email or web address] Date: 05/31/2023 Prepared by: Corlis Leak  Exercises - Seated Scapular Retraction  - 1 x daily - 7 x weekly - 1-3 sets - 10 reps - 2-3 sec hold - Doorway Pec Stretch at 60 Degrees Abduction  - 3 x daily - 7 x weekly - 1 sets - 3 reps - Doorway Pec Stretch at 90 Degrees Abduction  - 3 x daily - 7 x weekly - 1 sets - 3 reps - 30 seconds  hold - Doorway Pec Stretch at 120 Degrees Abduction  - 3 x daily - 7 x weekly - 1 sets - 3 reps - 30 second hold  hold -  Seated Cervical Retraction  - 2 x daily - 7 x weekly - 1-2 sets - 5-10 reps - 10 sec  hold - Seated Scapular Retraction  - 2 x daily - 7 x weekly - 1-2 sets - 10 reps - 10 sec  hold - Shoulder External Rotation and Scapular Retraction  - 3 x daily - 7 x weekly - 1 sets - 10 reps - 3-5 sec   hold - Standing Shoulder W at Wall  - 1-2 x daily - 7 x weekly - 1 sets - 10 reps - 3 sec  hold - Supine Chest Stretch on Foam Roll  - 2 x daily - 7 x weekly - 1 sets - 1 reps - 2-5 min  sec  hold - Standing Infraspinatus/Teres Minor Release with Ball at [employer]  - 2 x daily - 7 x weekly - Standing Pectoral Release with Ball at [employer]  - 3-4 x daily - 7 x weekly - Quadruped Thoracic Rotation - Reach Under  - 1 x daily - 7 x weekly - 3 sets - 10 reps - Shoulder External Rotation and Scapular Retraction with Resistance  - 2 x daily - 7 x weekly - 1 sets -  10 reps - 3-5 sec  hold - Shoulder W - External Rotation with Resistance  - 2 x daily - 7 x weekly - 1-2 sets - 10 reps - 3 sec  hold - Standing Bilateral Low Shoulder Row with Anchored Resistance  - 2 x daily - 7 x weekly - 1-3 sets - 10 reps - 2-3 sec  hold - Shoulder extension with resistance - Neutral  - 1 x daily - 7 x weekly - 1-2 sets - 10 reps - 3-5 sec  hold  Patient Education - Office Posture  ASSESSMENT:  CLINICAL IMPRESSION: Good response to treatment including exercise, postural correction and manual work. Postural exercises progressed. Patient demonstrates increased cervical mobility and ROM. She has continued muscular tightness through R > L cervical musculature.    Evaluation: Patient is a 62 y.o. female who was seen today for physical therapy evaluation and treatment for neck pain beginning about 2 months ago and progressively worsening to where she has pain with most neck motion and limiting her sleep. She also has headaches multiple times per week. She has deficits in cervical ROM and also reports intermittent numbness into both thumbs. She had  relief with manual distraction and may benefit from trial of mechanical traction. Initial trial of DN/MT today with excellent twitch responses and decreased tissue tension immediately. Patient will benefit from skilled PT to address these deficits.    OBJECTIVE IMPAIRMENTS: decreased activity tolerance, decreased ROM, decreased strength, increased muscle spasms, impaired flexibility, postural dysfunction, and pain.    GOALS: Goals reviewed with patient? Yes  SHORT TERM GOALS: Target date: 06/01/23  Independent with initial HEP Baseline:  Goal status: INITIAL  2.  Decreased HA and pain by 30% Baseline:  Goal status: INITIAL   LONG TERM GOALS: Target date: 06/22/23  Ind with advanced HEP Baseline:  Goal status: INITIAL  2.  Decreased neck pain and HA by 75% to with ADLs to improve QOL Baseline:  Goal status: INITIAL  3.  Improved cervical to [facility] without pain provocation Baseline:  Goal status: INITIAL  4.  Improved FOTO to 58 Baseline: 42 Goal status: INITIAL  5.  Able to sleep without waking from neck pain Baseline:  Goal status: INITIAL    PLAN:  PT FREQUENCY: 2x/week  PT DURATION: 6 weeks  PLANNED INTERVENTIONS: Therapeutic exercises, Therapeutic activity, Neuromuscular re-education, Patient/Family education, Self Care, Joint mobilization, Dry Needling, Electrical stimulation, Spinal mobilization, Cryotherapy, Moist heat, Taping, Traction, Ultrasound, and Manual therapy  PLAN FOR NEXT SESSION: Follow-up on how she felt after traction at last appt. Continue with manual work and DN as indicated. Review and progress HEP. Mechanical traction as indicated   Eliezer Khawaja P. Leonor Liv PT, MPH 05/31/23 8:09 AM

## 2023-06-02 ENCOUNTER — Ambulatory Visit: Payer: BC Managed Care – PPO | Admitting: Rehabilitative and Restorative Service Providers"

## 2023-06-02 ENCOUNTER — Encounter: Payer: Self-pay | Admitting: Rehabilitative and Restorative Service Providers"

## 2023-06-02 DIAGNOSIS — M542 Cervicalgia: Secondary | ICD-10-CM

## 2023-06-02 DIAGNOSIS — R252 Cramp and spasm: Secondary | ICD-10-CM

## 2023-06-02 NOTE — Therapy (Signed)
OUTPATIENT PHYSICAL THERAPY CERVICAL TREATMENT   Patient Name: Loretta Bullock MRN: [id number] DOB:1961-01-06, 62 y.o., female Today's Date: 06/02/2023  END OF SESSION:  PT End of Session - 06/02/23 0855     Visit Number 6    Date for PT Re-Evaluation 06/22/23    Authorization Type BCBS    PT Start Time 0805    PT Stop Time 0900    PT Time Calculation (min) 55 min    Activity Tolerance Patient tolerated treatment well              Past Medical History:  Diagnosis Date   Anxiety    DDD (degenerative disc disease), cervical    History of lumbar laminectomy    Hypertension    PONV (postoperative nausea and vomiting)    Sleep apnea    CPAP 7 cm water   Past Surgical History:  Procedure Laterality Date   BACK SURGERY     CESAREAN SECTION  2002   LUMBAR DISC SURGERY  06/2013   Dr. Fabio Asa   LUMBAR FUSION  08/17/2017   MAXIMUM ACCESS (MAS)POSTERIOR LUMBAR INTERBODY FUSION (PLIF) 1 LEVEL  2016   Patient Active Problem List   Diagnosis Date Noted   DDD (degenerative disc disease), cervical 04/28/2023   Anxiety 07/20/2019   Hot flashes 07/20/2019   Primary osteoarthritis of right knee 03/04/2017   Subclinical hypothyroidism 05/01/2014   Lumbar stenosis 07/03/2013   Lumbar degenerative disc disease 01/11/2013   Severe obesity (BMI 35.0-35.9 with comorbidity) (HCC) 11/15/2011   Sleep apnea 01/20/2011   Episodic mood disorder (HCC) 11/16/2010   Controlled type 2 diabetes mellitus without complication, without long-term current use of insulin (HCC) 11/16/2010   Hyperlipidemia 09/01/2010   HYPERTENSION, BENIGN 09/01/2010    PCP: Agapito Games, MD   REFERRING PROVIDER: Monica Becton,*   REFERRING DIAG: M50.30 (ICD-10-CM) - DDD (degenerative disc disease), cervical   THERAPY DIAG:  Cervicalgia  Cramp and spasm  Rationale for Evaluation and Treatment: Rehabilitation  ONSET DATE: 2 months ago  SUBJECTIVE:                                                                                                                                                                                                          SUBJECTIVE STATEMENT: Patient reports some increased tightness in the R > L side of the neck today. Just mostly sore. She had a good day yesterday and worked on her exercises. Can tell the treatment and exercises are really working. Fewer headaches as well as less neck pain  and radicular tingling.   Evaluation: Two months ago started having neck pain and it has progressively worsened. Pain is right sided up to skull. She reports some N/T on left side and sometimes has B thumb numbness usually when looking down reading.  Muscle relaxors help at night and biofreeze. Prednisone helped but now it's back. Sleep affected. Headaches every other day. Hand dominance: Right  PERTINENT HISTORY:  Cervical DDD, lumbar fusion anxiety  PAIN:  Are you having pain? Yes: NPRS scale: 3/10 Pain location: right side up to skull Pain description: dull ache and gets sharp with movment Aggravating factors: all motion, worse with RSB Relieving factors: muscle relaxors and biofreeze helps immediately  PRECAUTIONS: None  WEIGHT BEARING RESTRICTIONS: No  FALLS:  Has patient fallen in last 6 months? No  LIVING ENVIRONMENT: Lives with: lives with their spouse Lives in: House/apartment   OCCUPATION: on computer all day, three screens  PATIENT GOALS: relieve the pain  NEXT MD VISIT: 6 weeks  OBJECTIVE:   DIAGNOSTIC FINDINGS:  XR IMPRESSION: Mild-to-moderate multilevel degenerative changes of the cervical spine with mild osseous neuroforaminal narrowing most pronounced at C3-4 and C4-5.  PATIENT SURVEYS:  FOTO 42 goal 58   POSTURE: rounded shoulders, forward head, and increased lumbar lordosis  PALPATION: L UT, levator, cervical paraspinals and suboccipitals PA mobs of cspine WNL   CERVICAL ROM: *pain   Active ROM A/PROM  (deg) eval  Flexion Full tight  Extension 38*  Right lateral flexion 17*  Left lateral flexion 35 tight  Right rotation 49*  Left rotation 50 tight   (Blank rows = not tested)  UPPER EXTREMITY ROM: WNL  UPPER EXTREMITY MMT: grossly 5/5 B UE, denies grip weakness  CERVICAL SPECIAL TESTS:  Spurling's test: Negative and Distraction test: Positive Distraction with right rotation x 5 improves pain and motion  [facility] Adult PT Treatment:                                                DATE: 06/02/23 Therapeutic Exercise: Standing  Doorway stretch 3 positions 30 sec x 2 each position Chin tuck with noodle 5 sec x 5 Scap squeeze with noodle 5-10 sec x 10  L's with noodle  red TB  5 sec x 10  W's with noodle red TB  5 sec x 10  Row green TB 3 sec x 10  Shoulder extension green TB 3 sec x 10 Sitting  Scap squeeze Chin tuck  Coregeous bal T spine  Supine   Manual Therapy: Skilled palpation to assess response tomanual work and DN  Trigger Point Dry-Needling  Treatment instructions: Expect mild to moderate muscle soreness. S/S of pneumothorax if dry needled over a lung field, and to seek immediate medical attention should they occur. Patient verbalized understanding of these instructions and education.  Patient Consent Given: Yes Education handout provided: Previously provided Muscles treated: L/R cervical paraspinals; suboccipitals; scaleni Electrical stimulation performed: no Parameters: n/a  Treatment response/outcome: decreased palpable tightness; decreased pain per pt report   Working on posture and alignment in sitting and standing  Modalities:   TENs bilat cervical musculature x 15 in  Moist heat cervical and thoracic   Therapeutic Activity: Myofacial ball release using 4 inch plastic ball patient standing at wall - work through the posterior shoulder girdle and into pecs  Self Care: Use of noodle for  sitting posture and alignment Encouraged ergonomic correction for  work  station and ask questions as needed   [facility] Adult PT Treatment:                                                DATE: 05/31/23 Therapeutic Exercise: Standing  Doorway stretch 3 positions 30 sec x 2 each position Chin tuck with noodle 5 sec x 5 Scap squeeze with noodle 5-10 sec x 10  L's with noodle  red TB  5 sec x 10  W's with noodle red TB  5 sec x 10  Row green TB 3 sec x 10  Shoulder extension green TB 3 sec x 10  Sitting  Scap squeeze Chin tuck  Coregeous bal T spine  Supine   Manual Therapy: Soft tissue mobilization working through pecs as well as ant/lat/post cervical musculature  PROM and stretch through cervical spine with manual cervical traction Therapeutic Activity: Mysofacial ball release suboccipitals using 2 golf balls - patient's head supported on pillow ~ 1-2 min  Myofacial ball release using 4 inch plastic ball patient standing at wall - work through the posterior shoulder girdle and into pecs  Working on posture and alignment in sitting and standing  Self Care: Use of noodle for sitting posture and alignment Encouraged ergonomic correction for  work station and ask questions as needed    PATIENT EDUCATION:  Education details: PT eval findings, anticipated POC, initial HEP, role of DN, and DN rational, procedure, outcomes, potential side effects, and recommended post-treatment exercises/activity  Person educated: Patient Education method: Explanation, Demonstration, and Handouts Education comprehension: verbalized understanding and returned demonstration  HOME EXERCISE PROGRAM: Access Code: [email or web address] URL: [email or web address] Date: 05/31/2023 Prepared by: Corlis Leak  Exercises - Seated Scapular Retraction  - 1 x daily - 7 x weekly - 1-3 sets - 10 reps - 2-3 sec hold - Doorway Pec Stretch at 60 Degrees Abduction  - 3 x daily - 7 x weekly - 1 sets - 3 reps - Doorway Pec Stretch at 90 Degrees Abduction  - 3 x daily - 7 x weekly - 1 sets - 3 reps  - 30 seconds  hold - Doorway Pec Stretch at 120 Degrees Abduction  - 3 x daily - 7 x weekly - 1 sets - 3 reps - 30 second hold  hold - Seated Cervical Retraction  - 2 x daily - 7 x weekly - 1-2 sets - 5-10 reps - 10 sec  hold - Seated Scapular Retraction  - 2 x daily - 7 x weekly - 1-2 sets - 10 reps - 10 sec  hold - Shoulder External Rotation and Scapular Retraction  - 3 x daily - 7 x weekly - 1 sets - 10 reps - 3-5 sec   hold - Standing Shoulder W at Wall  - 1-2 x daily - 7 x weekly - 1 sets - 10 reps - 3 sec  hold - Supine Chest Stretch on Foam Roll  - 2 x daily - 7 x weekly - 1 sets - 1 reps - 2-5 min  sec  hold - Standing Infraspinatus/Teres Minor Release with Ball at [employer]  - 2 x daily - 7 x weekly - Standing Pectoral Release with Ball at Wall  - 3-4 x daily - 7 x weekly - Quadruped Thoracic Rotation - Reach Under  -  1 x daily - 7 x weekly - 3 sets - 10 reps - Shoulder External Rotation and Scapular Retraction with Resistance  - 2 x daily - 7 x weekly - 1 sets - 10 reps - 3-5 sec  hold - Shoulder W - External Rotation with Resistance  - 2 x daily - 7 x weekly - 1-2 sets - 10 reps - 3 sec  hold - Standing Bilateral Low Shoulder Row with Anchored Resistance  - 2 x daily - 7 x weekly - 1-3 sets - 10 reps - 2-3 sec  hold - Shoulder extension with resistance - Neutral  - 1 x daily - 7 x weekly - 1-2 sets - 10 reps - 3-5 sec  hold  Patient Education - Office Posture  ASSESSMENT:  CLINICAL IMPRESSION: Continued progress with treatment including exercise, postural correction and manual work. Working on postural exercises with TB. Patient demonstrates increased cervical mobility and ROM. She has continued muscular tightness through R/L cervical musculature. Good response to manual work and DN. Trial of TENS and heat to address muscular tightness and prevent onset of soreness following manual work and DN.   Evaluation: Patient is a 62 y.o. female who was seen today for physical therapy evaluation  and treatment for neck pain beginning about 2 months ago and progressively worsening to where she has pain with most neck motion and limiting her sleep. She also has headaches multiple times per week. She has deficits in cervical ROM and also reports intermittent numbness into both thumbs. She had relief with manual distraction and may benefit from trial of mechanical traction. Initial trial of DN/MT today with excellent twitch responses and decreased tissue tension immediately. Patient will benefit from skilled PT to address these deficits.    OBJECTIVE IMPAIRMENTS: decreased activity tolerance, decreased ROM, decreased strength, increased muscle spasms, impaired flexibility, postural dysfunction, and pain.    GOALS: Goals reviewed with patient? Yes  SHORT TERM GOALS: Target date: 06/01/23  Independent with initial HEP Baseline:  Goal status: INITIAL  2.  Decreased HA and pain by 30% Baseline:  Goal status: INITIAL   LONG TERM GOALS: Target date: 06/22/23  Ind with advanced HEP Baseline:  Goal status: INITIAL  2.  Decreased neck pain and HA by 75% to with ADLs to improve QOL Baseline:  Goal status: INITIAL  3.  Improved cervical to [facility] without pain provocation Baseline:  Goal status: INITIAL  4.  Improved FOTO to 58 Baseline: 42 Goal status: INITIAL  5.  Able to sleep without waking from neck pain Baseline:  Goal status: INITIAL    PLAN:  PT FREQUENCY: 2x/week  PT DURATION: 6 weeks  PLANNED INTERVENTIONS: Therapeutic exercises, Therapeutic activity, Neuromuscular re-education, Patient/Family education, Self Care, Joint mobilization, Dry Needling, Electrical stimulation, Spinal mobilization, Cryotherapy, Moist heat, Taping, Traction, Ultrasound, and Manual therapy  PLAN FOR NEXT SESSION: Follow-up on how she felt after traction at last appt. Continue with manual work and DN as indicated. Review and progress HEP. Mechanical traction as indicated   Chitara Clonch P. Leonor Liv PT,  MPH 06/02/23 8:57 AM

## 2023-06-08 ENCOUNTER — Ambulatory Visit: Payer: BC Managed Care – PPO | Admitting: Rehabilitative and Restorative Service Providers"

## 2023-06-08 ENCOUNTER — Encounter: Payer: Self-pay | Admitting: Rehabilitative and Restorative Service Providers"

## 2023-06-08 DIAGNOSIS — M542 Cervicalgia: Secondary | ICD-10-CM | POA: Diagnosis not present

## 2023-06-08 DIAGNOSIS — R252 Cramp and spasm: Secondary | ICD-10-CM

## 2023-06-08 NOTE — Therapy (Addendum)
 OUTPATIENT PHYSICAL THERAPY CERVICAL TREATMENT PHYSICAL THERAPY DISCHARGE SUMMARY  Visits from Start of Care: 7  Current functional level related to goals / functional outcomes: See progress note for discharge status    Remaining deficits: Unknown    Education / Equipment: HEP    Patient agrees to discharge. Patient goals were partially met. Patient is being discharged due to not returning since the last visit.  Loretta Bullock P. Leonor Liv PT, MPH 11/29/23 12:35 PM     Patient Name: Loretta Bullock MRN: [id number] DOB:[birth year], 62 y.o., female Today's Date: 06/08/2023  END OF SESSION:  PT End of Session - 06/08/23 0724     Visit Number 7    Date for PT Re-Evaluation 06/22/23    Authorization Type BCBS    PT Start Time 0722   late for appt   PT Stop Time 0810    PT Time Calculation (min) 48 min    Activity Tolerance Patient tolerated treatment well              Past Medical History:  Diagnosis Date   Anxiety    DDD (degenerative disc disease), cervical    History of lumbar laminectomy    Hypertension    PONV (postoperative nausea and vomiting)    Sleep apnea    CPAP 7 cm water   Past Surgical History:  Procedure Laterality Date   BACK SURGERY     CESAREAN SECTION  2002   LUMBAR DISC SURGERY  06/2013   Dr. Fabio Asa   LUMBAR FUSION  08/17/2017   MAXIMUM ACCESS (MAS)POSTERIOR LUMBAR INTERBODY FUSION (PLIF) 1 LEVEL  2016   Patient Active Problem List   Diagnosis Date Noted   DDD (degenerative disc disease), cervical 04/28/2023   Anxiety 07/20/2019   Hot flashes 07/20/2019   Primary osteoarthritis of right knee 03/04/2017   Subclinical hypothyroidism 05/01/2014   Lumbar stenosis 07/03/2013   Lumbar degenerative disc disease 01/11/2013   Severe obesity (BMI 35.0-35.9 with comorbidity) (HCC) 11/15/2011   Sleep apnea 01/20/2011   Episodic mood disorder (HCC) 11/16/2010   Controlled type 2 diabetes mellitus without complication, without long-term current use  of insulin (HCC) 11/16/2010   Hyperlipidemia 09/01/2010   HYPERTENSION, BENIGN 09/01/2010    PCP: Agapito Games, MD   REFERRING PROVIDER: Agapito Games, *   REFERRING DIAG: M50.30 (ICD-10-CM) - DDD (degenerative disc disease), cervical   THERAPY DIAG:  Cervicalgia  Cramp and spasm  Rationale for Evaluation and Treatment: Rehabilitation  ONSET DATE: 2 months ago  SUBJECTIVE:  SUBJECTIVE STATEMENT: Patient reports some increased tightness in the R upper back shoulder blade area today. Neck is doing well. Bands really help and she uses them at work. Can tell the treatment and exercises are really working. Occasional headaches and some radicular tingling in her thumbs when driving.   Evaluation: Two months ago started having neck pain and it has progressively worsened. Pain is right sided up to skull. She reports some N/T on left side and sometimes has B thumb numbness usually when looking down reading.  Muscle relaxors help at night and biofreeze. Prednisone helped but now it's back. Sleep affected. Headaches every other day. Hand dominance: Right  PERTINENT HISTORY:  Cervical DDD, lumbar fusion anxiety  PAIN:  Are you having pain? Yes: NPRS scale: 4/10 Pain location: right upper back; shoulder blade area Pain description: dull ache and gets sharp with movment Aggravating factors: all motion, worse with RSB Relieving factors: muscle relaxors and biofreeze helps immediately  PRECAUTIONS: None  WEIGHT BEARING RESTRICTIONS: No  FALLS:  Has patient fallen in last 6 months? No  LIVING ENVIRONMENT: Lives with: lives with their spouse Lives in: House/apartment   OCCUPATION: on computer all day, three screens  PATIENT GOALS: relieve the pain  NEXT MD VISIT: 6  weeks  OBJECTIVE:   DIAGNOSTIC FINDINGS:  XR IMPRESSION: Mild-to-moderate multilevel degenerative changes of the cervical spine with mild osseous neuroforaminal narrowing most pronounced at C3-4 and C4-5.  PATIENT SURVEYS:  FOTO 42 goal 58   POSTURE: rounded shoulders, forward head, and increased lumbar lordosis  PALPATION: L UT, levator, cervical paraspinals and suboccipitals PA mobs of cspine WNL   CERVICAL ROM: *pain   Active ROM A/PROM (deg) eval AROM 06/08/23  Flexion Full tight   Extension 38* 56  Right lateral flexion 17* 36  Left lateral flexion 35 tight 45  Right rotation 49* 57  Left rotation 50 tight 64   (Blank rows = not tested)  UPPER EXTREMITY ROM: WNL  UPPER EXTREMITY MMT: grossly 5/5 B UE, denies grip weakness  CERVICAL SPECIAL TESTS:  Spurling's test: Negative and Distraction test: Positive Distraction with right rotation x 5 improves pain and motion  [facility] Adult PT Treatment:                                                DATE: 06/08/23 Therapeutic Exercise: Standing  Doorway stretch 3 positions 30 sec x 2 each position Shoulder flexion w/ dowel in doorway  Chin tuck with noodle 5 sec x 5 Scap squeeze with noodle 5-10 sec x 10  L's with noodle  red TB  5 sec x 10  W's with noodle red TB  5 sec x 10  Row green TB 3 sec x 10  Shoulder extension green TB 3 sec x 10 Wall clock red TB R/L Sitting  Scap squeeze Chin tuck  Coregeous bal T spine  Coregeous ball T spine with thoracic extension ~ 10 sec x 5  Supine   Manual Therapy: Skilled palpation to assess response tomanual work and DN  Trigger Point Dry-Needling  Treatment instructions: Expect mild to moderate muscle soreness. S/S of pneumothorax if dry needled over a lung field, and to seek immediate medical attention should they occur. Patient verbalized understanding of these instructions and education.  Patient Consent Given: Yes Education handout provided: Previously provided Muscles  treated: L/R cervical  paraspinals; suboccipitals; scaleni Electrical stimulation performed: no Parameters: n/a  Treatment response/outcome: decreased palpable tightness; decreased pain per pt report   Working on posture and alignment in sitting and standing  Modalities:   TENs bilat cervical musculature x 15 in  Moist heat cervical and thoracic   Therapeutic Activity: Myofacial ball release using 4 inch plastic ball patient standing at wall - work through the posterior shoulder girdle and into pecs  Self Care: Use of noodle for sitting posture and alignment Encouraged ergonomic correction for  work station and ask questions as needed    PATIENT EDUCATION:  Education details: PT eval findings, anticipated POC, initial HEP, role of DN, and DN rational, procedure, outcomes, potential side effects, and recommended post-treatment exercises/activity  Person educated: Patient Education method: Explanation, Demonstration, and Handouts Education comprehension: verbalized understanding and returned demonstration  HOME EXERCISE PROGRAM: Access Code: [email or web address] URL: [email or web address] Date: 06/08/2023 Prepared by: Corlis Leak  Exercises - Seated Scapular Retraction  - 1 x daily - 7 x weekly - 1-3 sets - 10 reps - 2-3 sec hold - Doorway Pec Stretch at 60 Degrees Abduction  - 3 x daily - 7 x weekly - 1 sets - 3 reps - Doorway Pec Stretch at 90 Degrees Abduction  - 3 x daily - 7 x weekly - 1 sets - 3 reps - 30 seconds  hold - Doorway Pec Stretch at 120 Degrees Abduction  - 3 x daily - 7 x weekly - 1 sets - 3 reps - 30 second hold  hold - Seated Cervical Retraction  - 2 x daily - 7 x weekly - 1-2 sets - 5-10 reps - 10 sec  hold - Seated Scapular Retraction  - 2 x daily - 7 x weekly - 1-2 sets - 10 reps - 10 sec  hold - Shoulder External Rotation and Scapular Retraction  - 3 x daily - 7 x weekly - 1 sets - 10 reps - 3-5 sec   hold - Standing Shoulder W at Wall  - 1-2 x daily - 7 x  weekly - 1 sets - 10 reps - 3 sec  hold - Supine Chest Stretch on Foam Roll  - 2 x daily - 7 x weekly - 1 sets - 1 reps - 2-5 min  sec  hold - Standing Infraspinatus/Teres Minor Release with Ball at [employer]  - 2 x daily - 7 x weekly - Standing Pectoral Release with Ball at [employer]  - 3-4 x daily - 7 x weekly - Quadruped Thoracic Rotation - Reach Under  - 1 x daily - 7 x weekly - 3 sets - 10 reps - Shoulder External Rotation and Scapular Retraction with Resistance  - 2 x daily - 7 x weekly - 1 sets - 10 reps - 3-5 sec  hold - Shoulder W - External Rotation with Resistance  - 2 x daily - 7 x weekly - 1-2 sets - 10 reps - 3 sec  hold - Standing Bilateral Low Shoulder Row with Anchored Resistance  - 2 x daily - 7 x weekly - 1-3 sets - 10 reps - 2-3 sec  hold - Shoulder extension with resistance - Neutral  - 1 x daily - 7 x weekly - 1-2 sets - 10 reps - 3-5 sec  hold - Standing Shoulder Flexion Wall Walk  - 2 x daily - 7 x weekly - 1 sets - 3 reps - 30 sec  hold - Seated Thoracic Lumbar Extension  - 2  x daily - 7 x weekly - 1 sets - 3-5 reps - 5-10 sec  hold - Wall Clock with Theraband  - 1 x daily - 7 x weekly - 1 sets - 10 reps - 2-3 sec  hold  Patient Education - Office Posture  ASSESSMENT:  CLINICAL IMPRESSION: Progressing well with treatment including exercise, postural correction and manual work. Working on postural exercises with TB. Patient demonstrates increased cervical mobility and ROM. She has continued muscular tightness through R/L cervical musculature. Good response to manual work and DN. Has a TENS unit at home. Progressing well toward stated goals of therapy.    Evaluation: Patient is a 62 y.o. female who was seen today for physical therapy evaluation and treatment for neck pain beginning about 2 months ago and progressively worsening to where she has pain with most neck motion and limiting her sleep. She also has headaches multiple times per week. She has deficits in cervical ROM and  also reports intermittent numbness into both thumbs. She had relief with manual distraction and may benefit from trial of mechanical traction. Initial trial of DN/MT today with excellent twitch responses and decreased tissue tension immediately. Patient will benefit from skilled PT to address these deficits.    OBJECTIVE IMPAIRMENTS: decreased activity tolerance, decreased ROM, decreased strength, increased muscle spasms, impaired flexibility, postural dysfunction, and pain.    GOALS: Goals reviewed with patient? Yes  SHORT TERM GOALS: Target date: 06/01/23  Independent with initial HEP Baseline:  Goal status: met  2.  Decreased HA and pain by 30% Baseline:  Goal status: met   LONG TERM GOALS: Target date: 06/22/23  Ind with advanced HEP Baseline:  Goal status: on going   2.  Decreased neck pain and HA by 75% to with ADLs to improve QOL Baseline:  Goal status: met  3.  Improved cervical to [facility] without pain provocation Baseline:  Goal status: met  4.  Improved FOTO to 58 Baseline: 42 Goal status:   5.  Able to sleep without waking from neck pain Baseline:  Goal status: met    PLAN:  PT FREQUENCY: 2x/week  PT DURATION: 6 weeks  PLANNED INTERVENTIONS: Therapeutic exercises, Therapeutic activity, Neuromuscular re-education, Patient/Family education, Self Care, Joint mobilization, Dry Needling, Electrical stimulation, Spinal mobilization, Cryotherapy, Moist heat, Taping, Traction, Ultrasound, and Manual therapy  PLAN FOR NEXT SESSION: Follow-up on how she felt after traction at last appt. Continue with manual work and DN as indicated. Review and progress HEP. Mechanical traction as indicated   Heavenly Christine P. Leonor Liv PT, MPH 06/08/23 7:25 AM

## 2023-06-09 ENCOUNTER — Ambulatory Visit (INDEPENDENT_AMBULATORY_CARE_PROVIDER_SITE_OTHER): Payer: BC Managed Care – PPO | Admitting: Sports Medicine

## 2023-06-09 DIAGNOSIS — M5030 Other cervical disc degeneration, unspecified cervical region: Secondary | ICD-10-CM

## 2023-06-09 MED ORDER — PREGABALIN 25 MG PO CAPS
25.0000 mg | ORAL_CAPSULE | Freq: Every day | ORAL | 3 refills | Status: DC
Start: 2023-06-09 — End: 2023-10-31

## 2023-06-09 NOTE — Assessment & Plan Note (Signed)
Known cervical DDD, bilateral C6 radicular symptoms, much better with physical therapy, steroids. She still has some discomfort, adding Lyrica 25 nightly. Some of her discomfort may also be related to carpal tunnel syndrome but she does have some nocturnal hand numbness and tingling. We will also get her to wear nocturnal splinting for now. As for MRI she will let me know if she would like to proceed.

## 2023-06-09 NOTE — Progress Notes (Signed)
    Procedures performed today:    None.  Independent interpretation of notes and tests performed by another provider:   None.  Brief History, Exam, Impression, and Recommendations:    DDD (degenerative disc disease), cervical Known cervical DDD, bilateral C6 radicular symptoms, much better with physical therapy, steroids. She still has some discomfort, adding Lyrica 25 nightly. Some of her discomfort may also be related to carpal tunnel syndrome but she does have some nocturnal hand numbness and tingling. We will also get her to wear nocturnal splinting for now. As for MRI she will let me know if she would like to proceed.    ____________________________________________ Ihor Austin. Benjamin Stain, M.D., ABFM., CAQSM., AME. Primary Care and Sports Medicine [facility]  Adjunct Professor of Family Medicine  [place] [employer] [facility] [employer]  Restaurant manager, fast food

## 2023-06-12 ENCOUNTER — Other Ambulatory Visit: Payer: Self-pay | Admitting: Family Medicine

## 2023-06-13 ENCOUNTER — Ambulatory Visit (INDEPENDENT_AMBULATORY_CARE_PROVIDER_SITE_OTHER): Payer: BC Managed Care – PPO | Admitting: Family Medicine

## 2023-06-13 ENCOUNTER — Encounter: Payer: Self-pay | Admitting: Family Medicine

## 2023-06-13 ENCOUNTER — Ambulatory Visit: Payer: BC Managed Care – PPO | Admitting: Rehabilitative and Restorative Service Providers"

## 2023-06-13 VITALS — BP 117/62 | HR 82 | Ht 60.0 in | Wt 204.0 lb

## 2023-06-13 DIAGNOSIS — F39 Unspecified mood [affective] disorder: Secondary | ICD-10-CM | POA: Diagnosis not present

## 2023-06-13 DIAGNOSIS — Z23 Encounter for immunization: Secondary | ICD-10-CM

## 2023-06-13 DIAGNOSIS — I10 Essential (primary) hypertension: Secondary | ICD-10-CM | POA: Diagnosis not present

## 2023-06-13 DIAGNOSIS — E119 Type 2 diabetes mellitus without complications: Secondary | ICD-10-CM | POA: Diagnosis not present

## 2023-06-13 DIAGNOSIS — Z6839 Body mass index (BMI) 39.0-39.9, adult: Secondary | ICD-10-CM

## 2023-06-13 LAB — POCT GLYCOSYLATED HEMOGLOBIN (HGB A1C): Hemoglobin A1C: 5.7 % — AB (ref 4.0–5.6)

## 2023-06-13 NOTE — Progress Notes (Signed)
Established Patient Office Visit  Subjective   Patient ID: Loretta Bullock, female    DOB: 1961-03-15  Age: 62 y.o. MRN: [id number]  Chief Complaint  Patient presents with   Diabetes    HPI  Diabetes - no hypoglycemic events. No wounds or sores that are not healing well. No increased thirst or urination. Checking glucose at home. Taking medications as prescribed without any side effects. On Trulicity .   Hypertension- Pt denies chest pain, SOB, dizziness, or heart palpitations.  Taking meds as directed w/o problems.  Denies medication side effects.        ROS    Objective:     BP 117/62   Pulse 82   Ht 5' (1.524 m)   Wt 204 lb (92.5 kg)   LMP 12/04/2012   SpO2 98%   BMI 39.84 kg/m    Physical Exam Vitals and nursing note reviewed.  Constitutional:      Appearance: Normal appearance.  HENT:     Head: Normocephalic and atraumatic.  Eyes:     Conjunctiva/sclera: Conjunctivae normal.  Cardiovascular:     Rate and Rhythm: Normal rate and regular rhythm.  Pulmonary:     Effort: Pulmonary effort is normal.     Breath sounds: Normal breath sounds.  Skin:    General: Skin is warm and dry.  Neurological:     Mental Status: She is alert.  Psychiatric:        Mood and Affect: Mood normal.      Results for orders placed or performed in visit on 06/13/23  POCT HgB A1C  Result Value Ref Range   Hemoglobin A1C 5.7 (A) 4.0 - 5.6 %   HbA1c POC (<> result, manual entry)     HbA1c, POC (prediabetic range)     HbA1c, POC (controlled diabetic range)        The 10-year ASCVD risk score (Arnett DK, et al., 2019) is: 8.4%    Assessment & Plan:   Problem List Items Addressed This Visit       Cardiovascular and Mediastinum   HYPERTENSION, BENIGN - Primary    Well controlled. Continue current regimen. Follow up in  6 months.       Relevant Orders   CMP14+EGFR   Lipid panel     Endocrine   Controlled type 2 diabetes mellitus without complication, without  long-term current use of insulin (HCC)    She is doing great on Trulicity.  Weight is stable.  Continue work on [employer] and regular exercise currently on Trulicity 4.5 mg.      Relevant Orders   POCT HgB A1C (Completed)   CMP14+EGFR   Lipid panel     Other   Episodic mood disorder (HCC)    On effexor 75 XR.  Reports that she is doing well.  PHQ-9 of 0 and GAD-7 of 0.  Continue current regimen follow-up in 4 months.      Other Visit Diagnoses     Encounter for immunization       Relevant Orders   Flu vaccine trivalent PF, 6mos and older(Flulaval,Afluria,Fluarix,Fluzone) (Completed)   BMI 39.0-39.9,adult           Return in about 4 months (around 10/20/2023) for Diabetes follow-up, Hypertension.    Nani Gasser, MD

## 2023-06-13 NOTE — Assessment & Plan Note (Signed)
She is doing great on Trulicity.  Weight is stable.  Continue work on [employer] and regular exercise currently on Trulicity 4.5 mg.

## 2023-06-13 NOTE — Assessment & Plan Note (Signed)
Well controlled. Continue current regimen. Follow up in  6 months.

## 2023-06-13 NOTE — Assessment & Plan Note (Addendum)
On effexor 75 XR.  Reports that she is doing well.  PHQ-9 of 0 and GAD-7 of 0.  Continue current regimen follow-up in 4 months.

## 2023-07-14 ENCOUNTER — Encounter: Payer: Self-pay | Admitting: Sports Medicine

## 2023-07-14 DIAGNOSIS — M5030 Other cervical disc degeneration, unspecified cervical region: Secondary | ICD-10-CM

## 2023-07-14 DIAGNOSIS — M542 Cervicalgia: Secondary | ICD-10-CM

## 2023-07-21 ENCOUNTER — Encounter: Payer: Self-pay | Admitting: Sports Medicine

## 2023-07-21 ENCOUNTER — Encounter: Payer: Self-pay | Admitting: Family Medicine

## 2023-07-21 ENCOUNTER — Ambulatory Visit (INDEPENDENT_AMBULATORY_CARE_PROVIDER_SITE_OTHER): Payer: BC Managed Care – PPO | Admitting: Sports Medicine

## 2023-07-21 DIAGNOSIS — M5030 Other cervical disc degeneration, unspecified cervical region: Secondary | ICD-10-CM | POA: Diagnosis not present

## 2023-07-21 DIAGNOSIS — G5603 Carpal tunnel syndrome, bilateral upper limbs: Secondary | ICD-10-CM

## 2023-07-21 MED ORDER — MELOXICAM 15 MG PO TABS
ORAL_TABLET | ORAL | 3 refills | Status: AC
Start: 2023-07-21 — End: ?

## 2023-07-21 MED ORDER — FLUCONAZOLE 150 MG PO TABS: 150.0000 mg | ORAL_TABLET | Freq: Once | ORAL | 0 refills | Status: AC

## 2023-07-21 NOTE — Progress Notes (Signed)
    Procedures performed today:    None.  Independent interpretation of notes and tests performed by another provider:   None.  Brief History, Exam, Impression, and Recommendations:    DDD (degenerative disc disease), cervical This is a very pleasant 62 year old female returns, she has chronic right sided neck pain, she had bilateral numbness and tingling in the hands mostly to the thumb. We added Lyrica which was not effective, we also added carpal tunnel splints, the splints have resolved her nocturnal numbness but she continues to have axial right-sided neck pain. X-rays did show multilevel DDD. She does have her MRI scheduled. We will add some Mobic in the meantime and she would like me to going order the epidural as soon as I see the MRI, likely right C6-C7 interlaminar. I will see her back 6 weeks after the epidural injection.  Carpal tunnel syndrome, bilateral In addition to the neck pain Tresa Endo did have some bilateral hand numbness, worse at night. We added nocturnal splinting and this resolved. By virtue of the nocturnal symptomatology and improvement with night splinting I do suspect this is more carpal tunnel syndrome. We can certainly try injections in the future if it recurs.    ____________________________________________ Ihor Austin. Benjamin Stain, M.D., ABFM., CAQSM., AME. Primary Care and Sports Medicine [place] [facility]  Adjunct Professor of Family Medicine  [place] [employer] [facility] [employer]  Restaurant manager, fast food

## 2023-07-21 NOTE — Assessment & Plan Note (Signed)
In addition to the neck pain Loretta Bullock did have some bilateral hand numbness, worse at night. We added nocturnal splinting and this resolved. By virtue of the nocturnal symptomatology and improvement with night splinting I do suspect this is more carpal tunnel syndrome. We can certainly try injections in the future if it recurs.

## 2023-07-21 NOTE — Assessment & Plan Note (Signed)
This is a very pleasant 62 year old female returns, she has chronic right sided neck pain, she had bilateral numbness and tingling in the hands mostly to the thumb. We added Lyrica which was not effective, we also added carpal tunnel splints, the splints have resolved her nocturnal numbness but she continues to have axial right-sided neck pain. X-rays did show multilevel DDD. She does have her MRI scheduled. We will add some Mobic in the meantime and she would like me to going order the epidural as soon as I see the MRI, likely right C6-C7 interlaminar. I will see her back 6 weeks after the epidural injection.

## 2023-07-21 NOTE — Telephone Encounter (Signed)
Meds ordered this encounter  Medications   fluconazole (DIFLUCAN) 150 MG tablet    Sig: Take 1 tablet (150 mg total) by mouth once for 1 dose.    Dispense:  1 tablet    Refill:  0   Needs appt if not better

## 2023-07-24 ENCOUNTER — Ambulatory Visit: Payer: BC Managed Care – PPO

## 2023-07-24 DIAGNOSIS — M5030 Other cervical disc degeneration, unspecified cervical region: Secondary | ICD-10-CM

## 2023-07-24 DIAGNOSIS — M542 Cervicalgia: Secondary | ICD-10-CM

## 2023-07-30 ENCOUNTER — Other Ambulatory Visit: Payer: Self-pay | Admitting: Family Medicine

## 2023-08-07 ENCOUNTER — Other Ambulatory Visit: Payer: Self-pay | Admitting: Family Medicine

## 2023-08-07 DIAGNOSIS — I10 Essential (primary) hypertension: Secondary | ICD-10-CM

## 2023-08-22 ENCOUNTER — Other Ambulatory Visit: Payer: Self-pay | Admitting: Sports Medicine

## 2023-08-22 DIAGNOSIS — M5030 Other cervical disc degeneration, unspecified cervical region: Secondary | ICD-10-CM

## 2023-09-07 NOTE — Discharge Instructions (Signed)

## 2023-09-09 ENCOUNTER — Ambulatory Visit
Admission: RE | Admit: 2023-09-09 | Discharge: 2023-09-09 | Disposition: A | Discharge status: Home / Self Care | Payer: BC Managed Care – PPO | Source: Ambulatory Visit | Attending: Sports Medicine | Admitting: Sports Medicine

## 2023-09-09 DIAGNOSIS — M5030 Other cervical disc degeneration, unspecified cervical region: Secondary | ICD-10-CM

## 2023-09-09 MED ORDER — IOPAMIDOL (ISOVUE-M 300) INJECTION 61%
1.0000 mL | Freq: Once | INTRAMUSCULAR | Status: AC | PRN
  Administered 2023-09-09: 1 mL via EPIDURAL

## 2023-09-09 MED ORDER — TRIAMCINOLONE ACETONIDE 40 MG/ML IJ SUSP (RADIOLOGY)
60.0000 mg | Freq: Once | INTRAMUSCULAR | Status: AC
  Administered 2023-09-09: 60 mg via EPIDURAL

## 2023-10-30 ENCOUNTER — Other Ambulatory Visit: Payer: Self-pay | Admitting: Family Medicine

## 2023-10-30 DIAGNOSIS — F39 Unspecified mood [affective] disorder: Secondary | ICD-10-CM

## 2023-10-31 ENCOUNTER — Encounter: Payer: Self-pay | Admitting: Family Medicine

## 2023-10-31 ENCOUNTER — Ambulatory Visit (INDEPENDENT_AMBULATORY_CARE_PROVIDER_SITE_OTHER): Payer: BC Managed Care – PPO | Admitting: Family Medicine

## 2023-10-31 VITALS — BP 119/59 | HR 83 | Ht 60.0 in | Wt 209.0 lb

## 2023-10-31 DIAGNOSIS — E119 Type 2 diabetes mellitus without complications: Secondary | ICD-10-CM

## 2023-10-31 DIAGNOSIS — I10 Essential (primary) hypertension: Secondary | ICD-10-CM

## 2023-10-31 DIAGNOSIS — Z7985 Long-term (current) use of injectable non-insulin antidiabetic drugs: Secondary | ICD-10-CM | POA: Diagnosis not present

## 2023-10-31 DIAGNOSIS — F39 Unspecified mood [affective] disorder: Secondary | ICD-10-CM | POA: Diagnosis not present

## 2023-10-31 LAB — POCT GLYCOSYLATED HEMOGLOBIN (HGB A1C): Hemoglobin A1C: 5.5 % (ref 4.0–5.6)

## 2023-10-31 MED ORDER — TRULICITY 4.5 MG/0.5ML ~~LOC~~ SOAJ: 4.5000 mg | SUBCUTANEOUS | 2 refills | Status: DC

## 2023-10-31 NOTE — Assessment & Plan Note (Signed)
 Well controlled. Continue current regimen. Follow up in  6 mo

## 2023-10-31 NOTE — Progress Notes (Signed)
   Established Patient Office Visit  Subjective  Patient ID: Loretta Bullock, female    DOB: 12/30/1960  Age: 63 y.o. MRN: [id number]  No chief complaint on file.   HPI  Follow-up BP and diabetes.    ROS    Objective:     BP (!) 119/59   Pulse 83   Ht 5' (1.524 m)   Wt 209 lb (94.8 kg)   LMP 12/04/2012   SpO2 95%   BMI 40.82 kg/m    Physical Exam Vitals and nursing note reviewed.  Constitutional:      Appearance: Normal appearance.  HENT:     Head: Normocephalic and atraumatic.  Eyes:     Conjunctiva/sclera: Conjunctivae normal.  Cardiovascular:     Rate and Rhythm: Normal rate and regular rhythm.  Pulmonary:     Effort: Pulmonary effort is normal.     Breath sounds: Normal breath sounds.  Skin:    General: Skin is warm and dry.  Neurological:     Mental Status: She is alert.  Psychiatric:        Mood and Affect: Mood normal.      Results for orders placed or performed in visit on 10/31/23  POCT HgB A1C  Result Value Ref Range   Hemoglobin A1C 5.5 4.0 - 5.6 %   HbA1c POC (<> result, manual entry)     HbA1c, POC (prediabetic range)     HbA1c, POC (controlled diabetic range)        The 10-year ASCVD risk score (Arnett DK, et al., 2019) is: 8.7%    Assessment & Plan:   Problem List Items Addressed This Visit       Cardiovascular and Mediastinum   HYPERTENSION, BENIGN - Primary   Well controlled. Continue current regimen. Follow up in  68mo       Relevant Orders   CMP14+EGFR   Urine Microalbumin w/creat. ratio   Lipid panel     Endocrine   Controlled type 2 diabetes mellitus without complication, without long-term current use of insulin (HCC)   On trulicity  4.5mg .  A1c looks fantastic today at 5.5.  Continue to work on healthy diet and regular exercise.  She will need refills today.  Lab Results  Component Value Date   HGBA1C 5.5 10/31/2023         Relevant Medications   Dulaglutide  (TRULICITY ) 4.5 MG/0.5ML SOAJ   Other Relevant  Orders   CMP14+EGFR   Urine Microalbumin w/creat. ratio   POCT HgB A1C (Completed)     Other   Episodic mood disorder (HCC)   On velafaxine. Doing well.  No changes made today.  Flowsheet Row Office Visit from 10/31/2023 in [facility] Primary Care & Sports Medicine at [facility]  PHQ-9 Total Score 1             Return in about 4 months (around 02/28/2024) for Hypertension, Diabetes follow-up.    Duaine German, MD

## 2023-10-31 NOTE — Assessment & Plan Note (Addendum)
 On velafaxine. Doing well.  No changes made today.  Flowsheet Row Office Visit from 10/31/2023 in [facility] Primary Care & Sports Medicine at [facility]  PHQ-9 Total Score 1

## 2023-10-31 NOTE — Assessment & Plan Note (Addendum)
 On trulicity  4.5mg .  A1c looks fantastic today at 5.5.  Continue to work on healthy diet and regular exercise.  She will need refills today.  Lab Results  Component Value Date   HGBA1C 5.5 10/31/2023

## 2023-11-01 ENCOUNTER — Encounter: Payer: Self-pay | Admitting: Family Medicine

## 2023-11-01 LAB — CMP14+EGFR
ALT: 28 [IU]/L (ref 0–32)
AST: 29 [IU]/L (ref 0–40)
Albumin: 4.4 g/dL (ref 3.9–4.9)
Alkaline Phosphatase: 31 [IU]/L — ABNORMAL LOW (ref 44–121)
BUN/Creatinine Ratio: 14 (ref 12–28)
BUN: 17 mg/dL (ref 8–27)
Bilirubin Total: 0.3 mg/dL (ref 0.0–1.2)
CO2: 25 mmol/L (ref 20–29)
Calcium: 10.6 mg/dL — ABNORMAL HIGH (ref 8.7–10.3)
Chloride: 102 mmol/L (ref 96–106)
Creatinine, Ser: 1.25 mg/dL — ABNORMAL HIGH (ref 0.57–1.00)
Globulin, Total: 2.2 g/dL (ref 1.5–4.5)
Glucose: 117 mg/dL — ABNORMAL HIGH (ref 70–99)
Potassium: 3.7 mmol/L (ref 3.5–5.2)
Sodium: 143 mmol/L (ref 134–144)
Total Protein: 6.6 g/dL (ref 6.0–8.5)
eGFR: 49 mL/min/{1.73_m2} — ABNORMAL LOW (ref >59)

## 2023-11-01 LAB — LIPID PANEL
Chol/HDL Ratio: 3.8 {ratio} (ref 0.0–4.4)
Cholesterol, Total: 163 mg/dL (ref 100–199)
HDL: 43 mg/dL (ref >39)
LDL Chol Calc (NIH): 96 mg/dL (ref 0–99)
Triglycerides: 137 mg/dL (ref 0–149)
VLDL Cholesterol Cal: 24 mg/dL (ref 5–40)

## 2023-11-01 LAB — SPECIMEN STATUS REPORT

## 2023-11-01 LAB — MICROALBUMIN / CREATININE URINE RATIO
Creatinine, Urine: 74.9 mg/dL
Microalb/Creat Ratio: <4 mg/g{creat} (ref 0–29)
Microalbumin, Urine: <3 ug/mL

## 2023-11-01 NOTE — Progress Notes (Signed)
Hi Dvora, kidney function jumped up to 1.2 normal year and that 0.8-0.9 range.  This is a little unusual like to recheck this in a couple of weeks and just make sure to hydrate well before the appointment.  I just want to see if maybe it goes back down.  Calcium level was also just slightly elevated but if you are taking calcium that could explain it is not in a concerning or worrisome range.  Total cholesterol triglycerides and LDL look good.  Because of the diabetes though this still puts you at increased risk for cardiovascular disease.  So it still worthwhile to consider taking a statin.  We can talk about it more at your next visit.  The 10-year ASCVD risk score (Arnett DK, et al., 2019) is: 9%   Values used to calculate the score:     Age: 79 years     Sex: Female     Is Non-Hispanic African American: No     Diabetic: Yes     Tobacco smoker: No     Systolic Blood Pressure: 119 mmHg     Is BP treated: Yes     HDL Cholesterol: 43 mg/dL     Total Cholesterol: 163 mg/dL

## 2023-11-08 ENCOUNTER — Other Ambulatory Visit: Payer: Self-pay | Admitting: Family Medicine

## 2023-11-08 DIAGNOSIS — I10 Essential (primary) hypertension: Secondary | ICD-10-CM

## 2024-02-04 ENCOUNTER — Other Ambulatory Visit: Payer: Self-pay | Admitting: Family Medicine

## 2024-02-04 DIAGNOSIS — I10 Essential (primary) hypertension: Secondary | ICD-10-CM

## 2024-02-12 ENCOUNTER — Other Ambulatory Visit: Payer: Self-pay | Admitting: Family Medicine

## 2024-02-16 ENCOUNTER — Other Ambulatory Visit: Payer: Self-pay | Admitting: Family Medicine

## 2024-02-16 DIAGNOSIS — Z1231 Encounter for screening mammogram for malignant neoplasm of breast: Secondary | ICD-10-CM

## 2024-02-27 ENCOUNTER — Ambulatory Visit: Payer: BC Managed Care – PPO | Admitting: Family Medicine

## 2024-02-27 NOTE — Progress Notes (Deleted)
   Established Patient Office Visit  Subjective  Patient ID: Loretta Bullock, female    DOB: May 09, 1961  Age: 63 y.o. MRN: [id number]  No chief complaint on file.   HPI  Hypertension- Pt denies chest pain, SOB, dizziness, or heart palpitations.  Taking meds as directed w/o problems.  Denies medication side effects.    Diabetes - no hypoglycemic events. No wounds or sores that are not healing well. No increased thirst or urination. Checking glucose at home. Taking medications as prescribed without any side effects.   {History (Optional):23778}  ROS    Objective:     LMP 12/04/2012  {Vitals History (Optional):23777}  Physical Exam   No results found for any visits on 02/27/24.  {Labs (Optional):23779}  The 10-year ASCVD risk score (Arnett DK, et al., 2019) is: 9%    Assessment & Plan:   Problem List Items Addressed This Visit       Cardiovascular and Mediastinum   HYPERTENSION, BENIGN - Primary     Endocrine   Controlled type 2 diabetes mellitus without complication, without long-term current use of insulin (HCC)    No follow-ups on file.    Duaine German, MD

## 2024-03-21 ENCOUNTER — Ambulatory Visit

## 2024-04-05 ENCOUNTER — Ambulatory Visit

## 2024-04-09 ENCOUNTER — Ambulatory Visit (INDEPENDENT_AMBULATORY_CARE_PROVIDER_SITE_OTHER): Admitting: Family Medicine

## 2024-04-09 ENCOUNTER — Encounter: Payer: Self-pay | Admitting: Family Medicine

## 2024-04-09 VITALS — BP 117/55 | HR 80 | Ht 60.0 in | Wt 207.0 lb

## 2024-04-09 DIAGNOSIS — Z7985 Long-term (current) use of injectable non-insulin antidiabetic drugs: Secondary | ICD-10-CM

## 2024-04-09 DIAGNOSIS — F39 Unspecified mood [affective] disorder: Secondary | ICD-10-CM

## 2024-04-09 DIAGNOSIS — I10 Essential (primary) hypertension: Secondary | ICD-10-CM

## 2024-04-09 DIAGNOSIS — E119 Type 2 diabetes mellitus without complications: Secondary | ICD-10-CM

## 2024-04-09 DIAGNOSIS — N898 Other specified noninflammatory disorders of vagina: Secondary | ICD-10-CM

## 2024-04-09 LAB — POCT GLYCOSYLATED HEMOGLOBIN (HGB A1C): Hemoglobin A1C: 5.5 % (ref 4.0–5.6)

## 2024-04-09 MED ORDER — AMLODIPINE BESYLATE 5 MG PO TABS: 5.0000 mg | ORAL_TABLET | Freq: Every day | ORAL | Status: DC

## 2024-04-09 NOTE — Assessment & Plan Note (Signed)
 Well controlled. Continue current regimen. Follow up in  6 mo

## 2024-04-09 NOTE — Assessment & Plan Note (Signed)
 BP on lower side.  Ok to cut the amlodpine to half a tab and monitor BP at home.

## 2024-04-09 NOTE — Patient Instructions (Signed)
 Ok to cut the amlodpine to half a tab and monitor BP at home.

## 2024-04-09 NOTE — Progress Notes (Signed)
   Established Patient Office Visit  Subjective  Patient ID: Loretta Bullock, female    DOB: Aug 05, 1961  Age: 63 y.o. MRN: [id number]  Chief Complaint  Patient presents with   Hypertension    Mammogram scheduled for 8/25   Diabetes    Pt to schedule her DM eye exam Discussed doing Prevnar 20 today   mood    HPI  Hypertension- Pt denies chest pain, SOB, dizziness, or heart palpitations.  Taking meds as directed w/o problems.  Denies medication side effects.    Diabetes - no hypoglycemic events. No wounds or sores that are not healing well. No increased thirst or urination. Checking glucose at home. Taking medications as prescribed without any side effects.  C/o of vaginal itching, doesn't feel ike a yeast infection.  No rash.     ROS    Objective:     BP (!) 117/55   Pulse 80   Ht 5' (1.524 m)   Wt 207 lb 0.6 oz (93.9 kg)   LMP 12/04/2012   SpO2 97%   BMI 40.43 kg/m    Physical Exam Vitals and nursing note reviewed.  Constitutional:      Appearance: Normal appearance.  HENT:     Head: Normocephalic and atraumatic.  Eyes:     Conjunctiva/sclera: Conjunctivae normal.  Cardiovascular:     Rate and Rhythm: Normal rate and regular rhythm.  Pulmonary:     Effort: Pulmonary effort is normal.     Breath sounds: Normal breath sounds.  Skin:    General: Skin is warm and dry.  Neurological:     Mental Status: She is alert.  Psychiatric:        Mood and Affect: Mood normal.      Results for orders placed or performed in visit on 04/09/24  POCT HgB A1C  Result Value Ref Range   Hemoglobin A1C 5.5 4.0 - 5.6 %   HbA1c POC (<> result, manual entry)     HbA1c, POC (prediabetic range)     HbA1c, POC (controlled diabetic range)        The 10-year ASCVD risk score (Arnett DK, et al., 2019) is: 9.6%    Assessment & Plan:   Problem List Items Addressed This Visit       Cardiovascular and Mediastinum   HYPERTENSION, BENIGN   BP on lower side.  Ok to cut the  amlodpine to half a tab and monitor BP at home.       Relevant Medications   amLODipine  (NORVASC ) 5 MG tablet     Endocrine   Controlled type 2 diabetes mellitus without complication, without long-term current use of insulin (HCC) - Primary   Well controlled. Continue current regimen. Follow up in  57mo       Relevant Orders   POCT HgB A1C (Completed)     Other   Episodic mood disorder (HCC)   Happy with current regimen. Consider decreasing med in future. She will think about it.        Other Visit Diagnoses       Vaginal itching       Relevant Orders   WET PREP FOR TRICH, YEAST, CLUE      Vaginal itching - wet prep collected and sent.  If negative consider vaginal dryness and atrophy. Consider coconut oil or replens. If not improving schedule pelvic exam. .   Return in about 4 months (around 08/10/2024) for Diabetes follow-up, Hypertension.    Dorothyann Byars, MD

## 2024-04-09 NOTE — Assessment & Plan Note (Signed)
 Happy with current regimen. Consider decreasing med in future. She will think about it.

## 2024-04-10 ENCOUNTER — Ambulatory Visit: Payer: Self-pay | Admitting: Family Medicine

## 2024-04-10 LAB — WET PREP FOR TRICH, YEAST, CLUE
Clue Cell Exam: NEGATIVE
Trichomonas Exam: NEGATIVE
Yeast Exam: NEGATIVE

## 2024-04-10 NOTE — Progress Notes (Signed)
 Loretta Bullock, swab was negative for any type of yeast or bacteria.  So I really want you to give a try to either the coconut oil or Replens.  Try for couple of weeks if you are not getting relief we can talk about alternative options as well.

## 2024-05-02 ENCOUNTER — Ambulatory Visit

## 2024-05-08 ENCOUNTER — Other Ambulatory Visit: Payer: Self-pay | Admitting: Family Medicine

## 2024-05-08 DIAGNOSIS — I10 Essential (primary) hypertension: Secondary | ICD-10-CM

## 2024-05-08 DIAGNOSIS — F39 Unspecified mood [affective] disorder: Secondary | ICD-10-CM

## 2024-05-09 ENCOUNTER — Ambulatory Visit (INDEPENDENT_AMBULATORY_CARE_PROVIDER_SITE_OTHER)

## 2024-05-09 DIAGNOSIS — Z1231 Encounter for screening mammogram for malignant neoplasm of breast: Secondary | ICD-10-CM | POA: Diagnosis not present

## 2024-05-11 ENCOUNTER — Ambulatory Visit: Payer: Self-pay | Admitting: Family Medicine

## 2024-05-11 NOTE — Progress Notes (Signed)
 Please call patient. Normal mammogram.  Repeat in 1 year.

## 2024-05-13 ENCOUNTER — Other Ambulatory Visit: Payer: Self-pay | Admitting: Family Medicine

## 2024-05-22 ENCOUNTER — Encounter: Payer: Self-pay | Admitting: Sports Medicine

## 2024-06-19 ENCOUNTER — Encounter: Payer: Self-pay | Admitting: Family Medicine

## 2024-06-19 DIAGNOSIS — I10 Essential (primary) hypertension: Secondary | ICD-10-CM

## 2024-06-20 MED ORDER — AMLODIPINE BESYLATE 5 MG PO TABS: 5.0000 mg | ORAL_TABLET | Freq: Every day | ORAL | 1 refills | Status: DC

## 2024-06-20 NOTE — Telephone Encounter (Signed)
 Please see note from patient regarding Amlodipine  .   Last written 04/09/2024 Last OV 04/09/2024 Upcoming appt 08/13/2024

## 2024-06-21 MED ORDER — AMLODIPINE BESYLATE 10 MG PO TABS
10.0000 mg | ORAL_TABLET | Freq: Every day | ORAL | 1 refills | Status: AC
Start: 2024-06-21 — End: ?

## 2024-06-21 NOTE — Addendum Note (Signed)
 Addended by: Shearon Clonch D on: 06/21/2024 08:14 AM   Modules accepted: Orders

## 2024-07-23 ENCOUNTER — Encounter: Payer: Self-pay | Admitting: Family Medicine

## 2024-07-23 DIAGNOSIS — R200 Anesthesia of skin: Secondary | ICD-10-CM

## 2024-07-30 ENCOUNTER — Ambulatory Visit (INDEPENDENT_AMBULATORY_CARE_PROVIDER_SITE_OTHER)

## 2024-07-30 ENCOUNTER — Other Ambulatory Visit: Payer: Self-pay

## 2024-07-30 VITALS — BP 136/80 | Ht 60.0 in | Wt 207.0 lb

## 2024-07-30 DIAGNOSIS — G5601 Carpal tunnel syndrome, right upper limb: Secondary | ICD-10-CM | POA: Diagnosis not present

## 2024-07-30 DIAGNOSIS — R200 Anesthesia of skin: Secondary | ICD-10-CM

## 2024-07-30 DIAGNOSIS — G5603 Carpal tunnel syndrome, bilateral upper limbs: Secondary | ICD-10-CM

## 2024-07-30 DIAGNOSIS — G5602 Carpal tunnel syndrome, left upper limb: Secondary | ICD-10-CM

## 2024-07-30 NOTE — Progress Notes (Signed)
   Subjective:    Patient ID: Loretta Bullock, female    DOB: 63 y.o., 10-Sep-1961   MRN: [id number]  Chief Complaint: Bilateral hand numbness  History of Present Illness  Previously followed with Dr. ONEIDA for this issue Lately this seems to have progressed  Both hands have numbness / tingling / pain in all the fingers Worse in the left hand This same symptom has been present previously and Dr. Curtis was concerned that there was radiation from the cervical spine.  The symptoms seem to have improved following epidural steroid injections in her neck raising some additional evidence for potential cervical source of this. Patient states symptoms not further proximal than wrist. 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  Equivocal Durkan's.  Equivocal Phalen's.  Left wrist: Negative Tinel's.  Positive Durkan's.  Positive Phalen's.   Limited ultrasound examination of the bilateral wrists: Right median nerve with cross-sectional area of 10 mm Left median nerve with cross-sectional area of 11 mm Interpretation: Mild carpal tunnel syndrome bilaterally.     Assessment & Plan:   Assessment & Plan  Most likely bilateral carpal tunnel syndrome based on enlargement of median nerves visualized on ultrasound today though it is peculiar that her bilateral hand symptoms seemed to have improved transiently after the epidural steroid injections.  For now, we will empirically treat for carpal tunnel.  She was fitted for bilateral wrist cock-up splints here.  Recommend day and nighttime use for the next 1-2 weeks.  If pain worsening/persisting, recommend return for carpal tunnel injection which based on the size of her median nerves, I would recommend D5W.

## 2024-08-07 ENCOUNTER — Other Ambulatory Visit: Payer: Self-pay | Admitting: Family Medicine

## 2024-08-07 DIAGNOSIS — I10 Essential (primary) hypertension: Secondary | ICD-10-CM

## 2024-08-09 ENCOUNTER — Other Ambulatory Visit: Payer: Self-pay | Admitting: Family Medicine

## 2024-08-09 ENCOUNTER — Encounter: Payer: Self-pay | Admitting: Family Medicine

## 2024-08-09 DIAGNOSIS — E119 Type 2 diabetes mellitus without complications: Secondary | ICD-10-CM

## 2024-08-13 ENCOUNTER — Encounter: Payer: Self-pay | Admitting: Family Medicine

## 2024-08-13 ENCOUNTER — Ambulatory Visit (INDEPENDENT_AMBULATORY_CARE_PROVIDER_SITE_OTHER): Admitting: Family Medicine

## 2024-08-13 VITALS — BP 122/78 | HR 78 | Ht 60.0 in | Wt 208.0 lb

## 2024-08-13 DIAGNOSIS — E119 Type 2 diabetes mellitus without complications: Secondary | ICD-10-CM

## 2024-08-13 DIAGNOSIS — E1159 Type 2 diabetes mellitus with other circulatory complications: Secondary | ICD-10-CM | POA: Diagnosis not present

## 2024-08-13 DIAGNOSIS — E785 Hyperlipidemia, unspecified: Secondary | ICD-10-CM

## 2024-08-13 DIAGNOSIS — Z23 Encounter for immunization: Secondary | ICD-10-CM

## 2024-08-13 DIAGNOSIS — I10 Essential (primary) hypertension: Secondary | ICD-10-CM

## 2024-08-13 DIAGNOSIS — E038 Other specified hypothyroidism: Secondary | ICD-10-CM

## 2024-08-13 DIAGNOSIS — I152 Hypertension secondary to endocrine disorders: Secondary | ICD-10-CM | POA: Diagnosis not present

## 2024-08-13 DIAGNOSIS — F39 Unspecified mood [affective] disorder: Secondary | ICD-10-CM

## 2024-08-13 DIAGNOSIS — Z7985 Long-term (current) use of injectable non-insulin antidiabetic drugs: Secondary | ICD-10-CM

## 2024-08-13 LAB — POCT GLYCOSYLATED HEMOGLOBIN (HGB A1C): Hemoglobin A1C: 5.2 % (ref 4.0–5.6)

## 2024-08-13 NOTE — Assessment & Plan Note (Addendum)
 BP is OK, continue Diovan  HCT.  Type 2 diabetes mellitus Well-controlled with Trulicity , HbA1c 5.2%. Effective appetite suppression noted. - Continue Trulicity . - Ensure adequate protein intake with meals. - Ordered blood work today. - Monitor Trulicity  availability, contact pharmacy if issues arise. - Consider alternative pharmacy if needed.

## 2024-08-13 NOTE — Assessment & Plan Note (Signed)
 Continue Effexor , has some increased stress lately. Planning on retired in 2 years.

## 2024-08-13 NOTE — Assessment & Plan Note (Signed)
 Comtineu Lovaza .

## 2024-08-13 NOTE — Progress Notes (Signed)
 Established Patient Office Visit  Patient ID: Loretta Bullock, female    DOB: [birth year]  Age: 63 y.o. MRN: [id number] PCP: Alvan Loretta BIRCH, MD  Chief Complaint  Patient presents with   Diabetes   Hypertension   Hyperlipidemia    Subjective:     HPI  Discussed the use of AI scribe software for clinical note transcription with the patient, who gave verbal consent to proceed.  History of Present Illness Loretta Bullock is a 63 year old female with diabetes who presents for a follow-up visit.  Glycemic control and diabetes management - Diabetes mellitus managed with Trulicity . - Current hemoglobin A1c is 5.2%. - Trulicity  curbs appetite, resulting in early satiety and cessation of eating, though hunger may return hours later. - No chest pain or shortness of breath.  Medication access issues - Difficulty obtaining Trulicity  prescription due to changes in pharmacy's automated menu system. - Uses [facility] per insurance requirements, occasionally utilizes [facility] branch for convenience.  Physical activity - Attempts to increase physical activity by walking. - Finds regular exercise challenging due to a busy schedule.     ROS    Objective:     BP 133/73   Pulse 78   Ht 5' (1.524 m)   Wt 208 lb (94.3 kg)   LMP 12/04/2012   SpO2 98%   BMI 40.62 kg/m    Physical Exam Vitals and nursing note reviewed.  Constitutional:      Appearance: Normal appearance.  HENT:     Head: Normocephalic and atraumatic.  Eyes:     Conjunctiva/sclera: Conjunctivae normal.  Cardiovascular:     Rate and Rhythm: Normal rate and regular rhythm.  Pulmonary:     Effort: Pulmonary effort is normal.     Breath sounds: Normal breath sounds.  Skin:    General: Skin is warm and dry.  Neurological:     Mental Status: She is alert.  Psychiatric:        Mood and Affect: Mood normal.      Results for orders placed or performed in visit on 08/13/24  POCT HgB A1C  Result  Value Ref Range   Hemoglobin A1C 5.2 4.0 - 5.6 %   HbA1c POC (<> result, manual entry)     HbA1c, POC (prediabetic range)     HbA1c, POC (controlled diabetic range)        The 10-year ASCVD risk score (Arnett DK, et al., 2019) is: 12.2%    Assessment & Plan:   Problem List Items Addressed This Visit       Cardiovascular and Mediastinum   Hypertension associated with diabetes (HCC) - Primary   BP is OK, continue Diovan  HCT.  Type 2 diabetes mellitus Well-controlled with Trulicity , HbA1c 5.2%. Effective appetite suppression noted. - Continue Trulicity . - Ensure adequate protein intake with meals. - Ordered blood work today. - Monitor Trulicity  availability, contact pharmacy if issues arise. - Consider alternative pharmacy if needed.      Relevant Orders   CMP14+EGFR   CBC with Differential/Platelet     Endocrine   Subclinical hypothyroidism   Due to recheck TSH      Relevant Orders   TSH     Other   Hyperlipidemia   Comtineu Lovaza .       Relevant Orders   CMP14+EGFR   CBC with Differential/Platelet   Episodic mood disorder   Continue Effexor , has some increased stress lately. Planning on retired in 2 years.  Eye exam scheduled.   Assessment and Plan Assessment & Plan   General Health Maintenance Due for routine vaccinations and eye examination. - Administered pneumovax, flu, and tetanus vaccines. - Ensure eye examination is completed.    Return in about 4 months (around 12/11/2024) for Diabetes follow-up.    Loretta Byars, MD [facility] at [facility]

## 2024-08-13 NOTE — Assessment & Plan Note (Signed)
 Due to recheck TSH.

## 2024-08-14 ENCOUNTER — Ambulatory Visit: Payer: Self-pay | Admitting: Family Medicine

## 2024-08-14 LAB — CBC WITH DIFFERENTIAL/PLATELET
Basophils Absolute: 0 x10E3/uL (ref 0.0–0.2)
Basos: 1 %
EOS (ABSOLUTE): 0 x10E3/uL (ref 0.0–0.4)
Eos: 0 %
Hematocrit: 40.8 % (ref 34.0–46.6)
Hemoglobin: 13.3 g/dL (ref 11.1–15.9)
Immature Grans (Abs): 0 x10E3/uL (ref 0.0–0.1)
Immature Granulocytes: 0 %
Lymphocytes Absolute: 1.1 x10E3/uL (ref 0.7–3.1)
Lymphs: 26 %
MCH: 29.4 pg (ref 26.6–33.0)
MCHC: 32.6 g/dL (ref 31.5–35.7)
MCV: 90 fL (ref 79–97)
Monocytes Absolute: 0.4 x10E3/uL (ref 0.1–0.9)
Monocytes: 9 %
Neutrophils Absolute: 2.8 x10E3/uL (ref 1.4–7.0)
Neutrophils: 64 %
Platelets: 253 x10E3/uL (ref 150–450)
RBC: 4.52 x10E6/uL (ref 3.77–5.28)
RDW: 13.1 % (ref 11.7–15.4)
WBC: 4.3 x10E3/uL (ref 3.4–10.8)

## 2024-08-14 LAB — CMP14+EGFR
ALT: 28 IU/L (ref 0–32)
AST: 30 IU/L (ref 0–40)
Albumin: 4.7 g/dL (ref 3.9–4.9)
Alkaline Phosphatase: 41 IU/L — ABNORMAL LOW (ref 49–135)
BUN/Creatinine Ratio: 22 (ref 12–28)
BUN: 20 mg/dL (ref 8–27)
Bilirubin Total: 0.3 mg/dL (ref 0.0–1.2)
CO2: 22 mmol/L (ref 20–29)
Calcium: 9.6 mg/dL (ref 8.7–10.3)
Chloride: 102 mmol/L (ref 96–106)
Creatinine, Ser: 0.93 mg/dL (ref 0.57–1.00)
Globulin, Total: 1.7 g/dL (ref 1.5–4.5)
Glucose: 104 mg/dL — ABNORMAL HIGH (ref 70–99)
Potassium: 3.8 mmol/L (ref 3.5–5.2)
Sodium: 142 mmol/L (ref 134–144)
Total Protein: 6.4 g/dL (ref 6.0–8.5)
eGFR: 69 mL/min/1.73 (ref >59)

## 2024-08-14 LAB — TSH: TSH: 4.02 u[IU]/mL (ref 0.450–4.500)

## 2024-08-14 NOTE — Progress Notes (Signed)
 Hi Loretta Bullock, kidney function looks good this time it had jumped up last time we have done labs.  Liver enzymes are normal.  The alkaline phosphatase is a little on the low end but that is not concerning we only really care if it is high.  Blood count looks great.  Thyroid is stable.

## 2024-08-15 ENCOUNTER — Other Ambulatory Visit: Payer: Self-pay | Admitting: Family Medicine

## 2024-12-17 ENCOUNTER — Ambulatory Visit: Admitting: Family Medicine
